# Patient Record
Sex: Male | Born: 1945 | Race: White | Hispanic: No | Marital: Single | Smoking: Never smoker
Health system: Southern US, Community
[De-identification: ages and names within clinical notes are randomized; demographics above are authoritative.]

## PROBLEM LIST (undated history)

## (undated) DIAGNOSIS — F319 Bipolar disorder, unspecified: Secondary | ICD-10-CM

## (undated) DIAGNOSIS — I10 Essential (primary) hypertension: Secondary | ICD-10-CM

## (undated) DIAGNOSIS — Z9289 Personal history of other medical treatment: Secondary | ICD-10-CM

## (undated) DIAGNOSIS — S329XXA Fracture of unspecified parts of lumbosacral spine and pelvis, initial encounter for closed fracture: Secondary | ICD-10-CM

## (undated) DIAGNOSIS — B269 Mumps without complication: Secondary | ICD-10-CM

## (undated) DIAGNOSIS — R011 Cardiac murmur, unspecified: Secondary | ICD-10-CM

## (undated) DIAGNOSIS — H409 Unspecified glaucoma: Secondary | ICD-10-CM

## (undated) DIAGNOSIS — B019 Varicella without complication: Secondary | ICD-10-CM

## (undated) HISTORY — DX: Mumps without complication: B26.9

## (undated) HISTORY — DX: Bipolar disorder, unspecified: F31.9

## (undated) HISTORY — PX: EYE SURGERY: SHX253

## (undated) HISTORY — PX: SURGERY SCROTAL / TESTICULAR: SUR1316

## (undated) HISTORY — PX: PENILE PROSTHESIS IMPLANT: SHX240

## (undated) HISTORY — DX: Unspecified glaucoma: H40.9

## (undated) HISTORY — PX: CHOLECYSTECTOMY: SHX55

## (undated) HISTORY — DX: Personal history of other medical treatment: Z92.89

## (undated) HISTORY — DX: Essential (primary) hypertension: I10

## (undated) HISTORY — DX: Cardiac murmur, unspecified: R01.1

## (undated) HISTORY — DX: Fracture of unspecified parts of lumbosacral spine and pelvis, initial encounter for closed fracture: S32.9XXA

## (undated) HISTORY — DX: Varicella without complication: B01.9

## (undated) HISTORY — PX: ADENOIDECTOMY: SUR15

---

## 2011-07-07 ENCOUNTER — Inpatient Hospital Stay: Payer: Self-pay | Admitting: Internal Medicine

## 2012-10-25 IMAGING — CT CT HEAD WITHOUT CONTRAST
2 series · 15 of 30 positions shown, 19 images · non-contrast
Comparison: none

REASON FOR EXAM: AMS
COMMENTS:

[Series 2: without · axial · non-contrast · 0.44mm/px · z∈[-198,-74]mm · 13 of 31 slices shown, 17 images]
[im 3/31  brain]
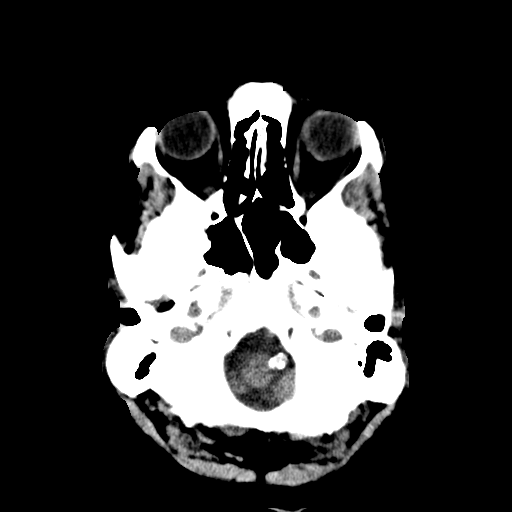
[im 3/31  bone]
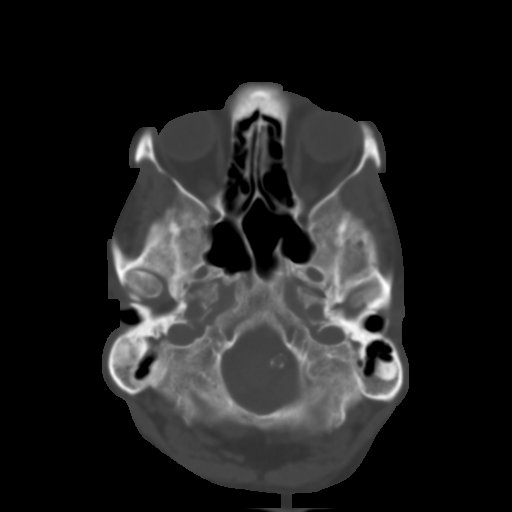
[im 5/31  brain]
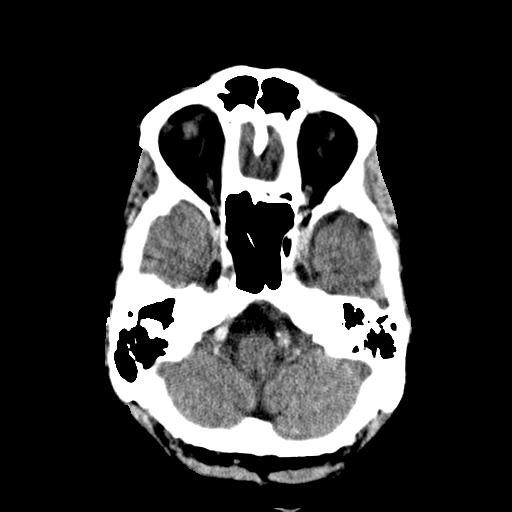
[im 7/31  brain]
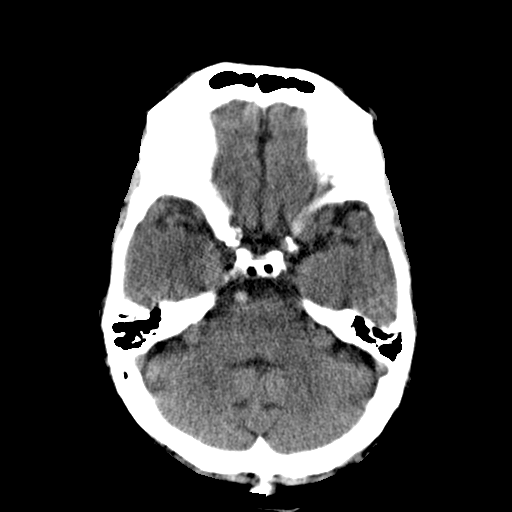
[im 9/31  brain]
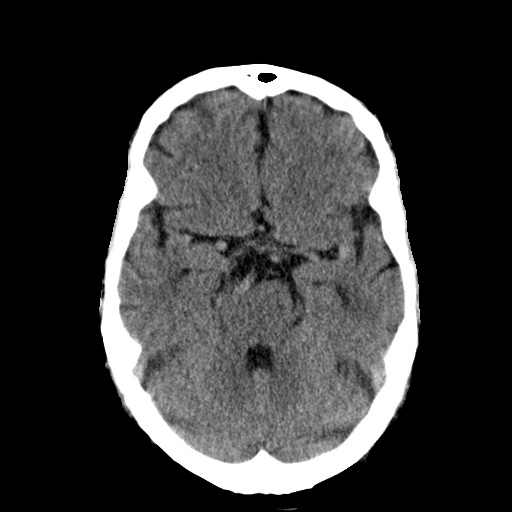
[im 11/31  brain]
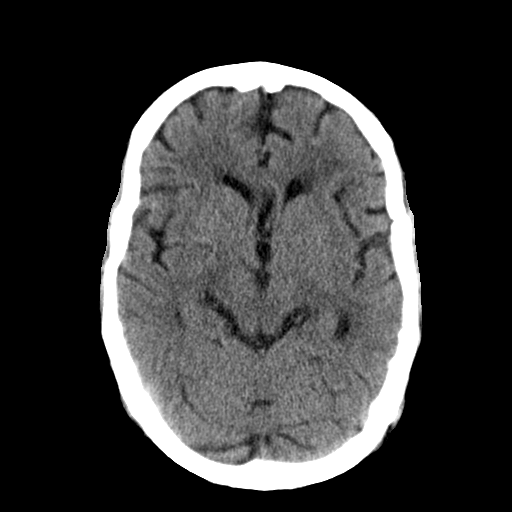
[im 11/31  bone]
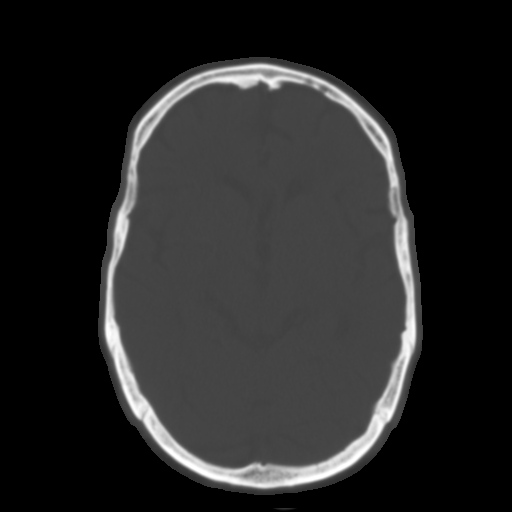
[im 13/31  brain]
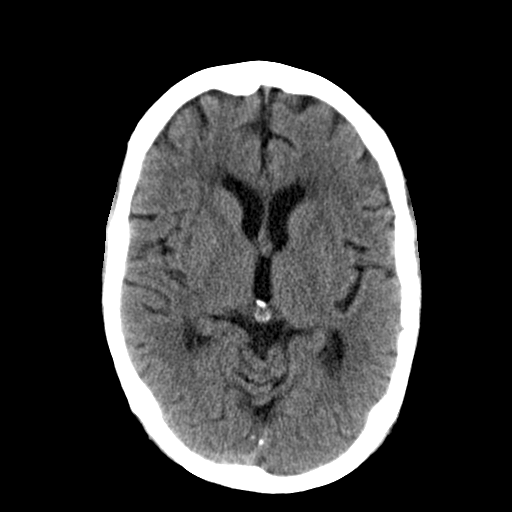
[im 16/31  brain]
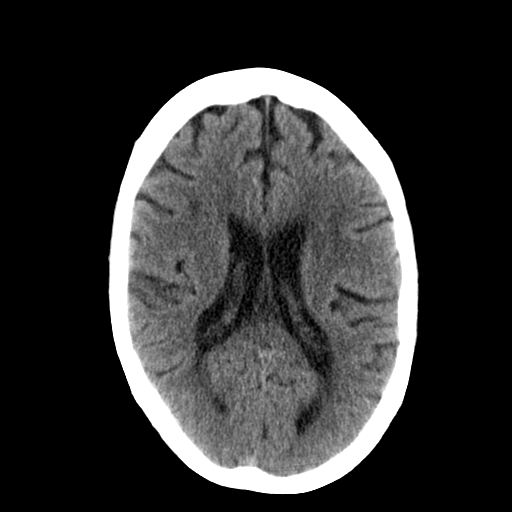
[im 18/31  brain]
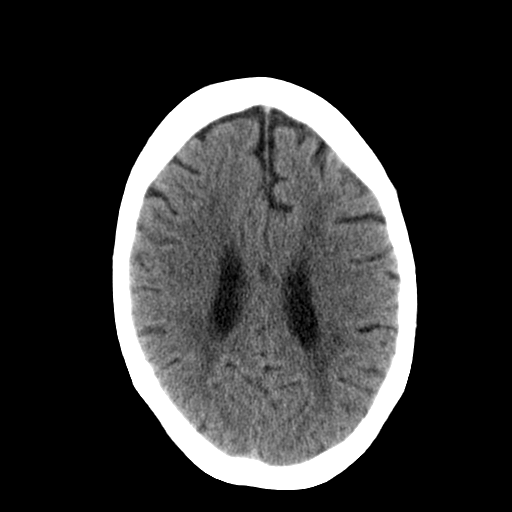
[im 20/31  brain]
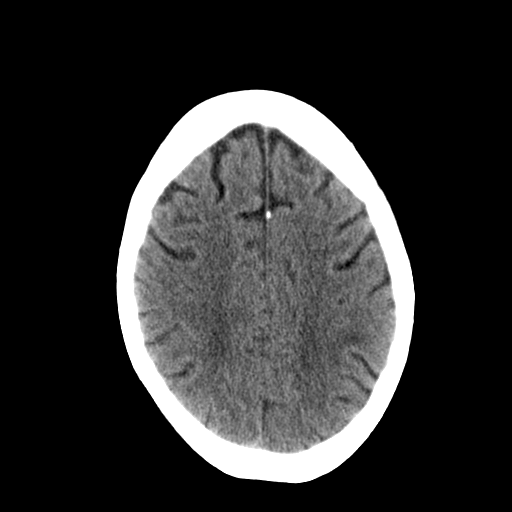
[im 20/31  bone]
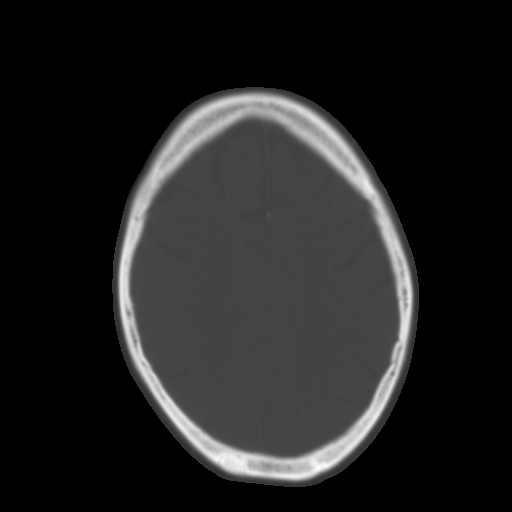
[im 22/31  brain]
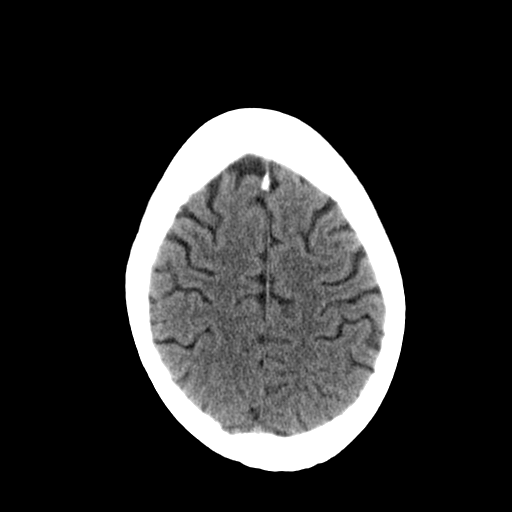
[im 24/31  brain]
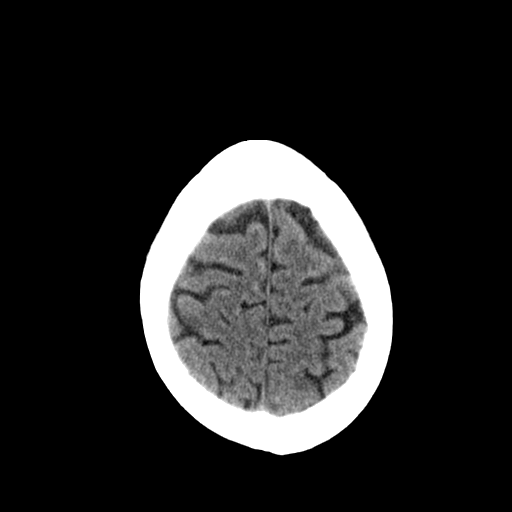
[im 26/31  brain]
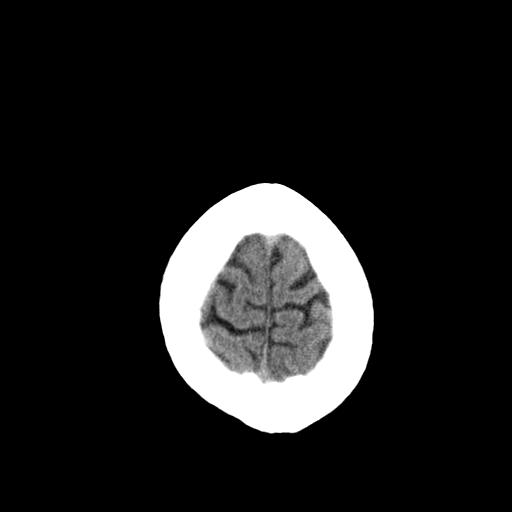
[im 28/31  brain]
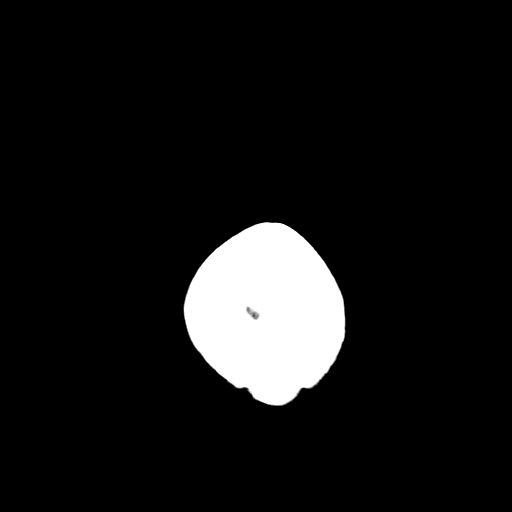
[im 28/31  bone]
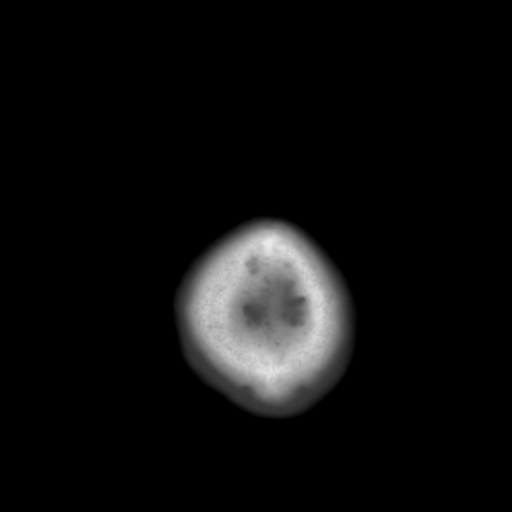

[Series 3: bone · axial · 0.44mm/px · z∈[-198,-178]mm · 2 of 31 slices shown]
[im 3/31  bone]
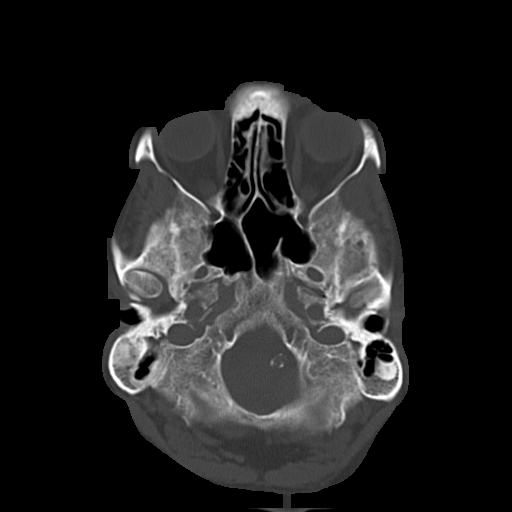
[im 7/31  bone]
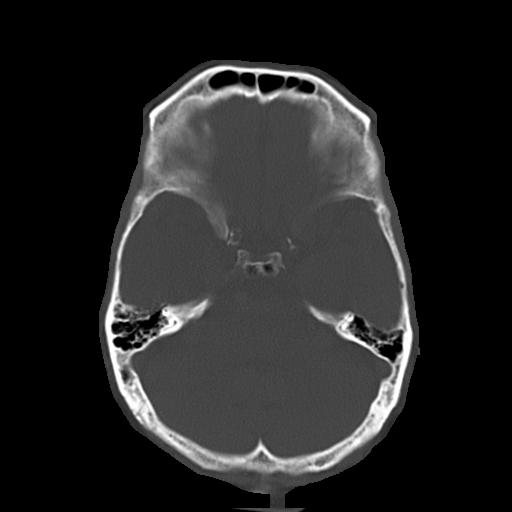

[15 of 30 positions shown; findings below may reference images not displayed]

PROCEDURE:     CT  - CT HEAD WITHOUT CONTRAST  - July 07, 2011  [DATE]

RESULT:     Axial noncontrast CT scanning was performed through the brain at
5 mm intervals and slice thicknesses. There are no previous studies
available for comparison.

The ventricles are normal in size and position. There is very mild
age-appropriate cerebral and cerebellar atrophy. There is no intracranial
hemorrhage nor intracranial mass effect. There is no evidence of an acute
evolving ischemic infarction. Subtle decreased density in the deep white
matter of both cerebral hemispheres slightly greater on the left than on the
right is consistent with chronic small vessel ischemic type change. At bone
window settings the observed portions of the paranasal sinuses and mastoid
air cells are clear. There is no evidence of an acute skull fracture.
IMPRESSION: I see no acute intracranial abnormality. There are
hypodensities in the deep white matter of both cerebral hemispheres slightly
more conspicuous on the left than on the right which are consistent with
chronic small vessel ischemia. Followup imaging is recommended if the
patient's symptoms persist.

## 2013-10-28 DIAGNOSIS — R159 Full incontinence of feces: Secondary | ICD-10-CM | POA: Diagnosis not present

## 2013-10-28 DIAGNOSIS — I10 Essential (primary) hypertension: Secondary | ICD-10-CM | POA: Diagnosis not present

## 2013-10-28 DIAGNOSIS — F3160 Bipolar disorder, current episode mixed, unspecified: Secondary | ICD-10-CM | POA: Diagnosis not present

## 2013-10-28 DIAGNOSIS — E876 Hypokalemia: Secondary | ICD-10-CM | POA: Diagnosis not present

## 2015-05-21 DIAGNOSIS — H269 Unspecified cataract: Secondary | ICD-10-CM | POA: Diagnosis not present

## 2015-07-23 ENCOUNTER — Encounter: Payer: Self-pay | Admitting: Family Medicine

## 2015-07-23 ENCOUNTER — Ambulatory Visit (INDEPENDENT_AMBULATORY_CARE_PROVIDER_SITE_OTHER): Payer: Medicare Other | Admitting: Family Medicine

## 2015-07-23 VITALS — BP 142/88 | HR 70 | Temp 98.7°F | Ht 68.25 in | Wt 202.5 lb

## 2015-07-23 DIAGNOSIS — E669 Obesity, unspecified: Secondary | ICD-10-CM

## 2015-07-23 DIAGNOSIS — R413 Other amnesia: Secondary | ICD-10-CM | POA: Diagnosis not present

## 2015-07-23 DIAGNOSIS — I10 Essential (primary) hypertension: Secondary | ICD-10-CM

## 2015-07-23 DIAGNOSIS — F319 Bipolar disorder, unspecified: Secondary | ICD-10-CM

## 2015-07-23 DIAGNOSIS — Z23 Encounter for immunization: Secondary | ICD-10-CM

## 2015-07-23 DIAGNOSIS — H409 Unspecified glaucoma: Secondary | ICD-10-CM

## 2015-07-23 DIAGNOSIS — R011 Cardiac murmur, unspecified: Secondary | ICD-10-CM

## 2015-07-23 DIAGNOSIS — Z0000 Encounter for general adult medical examination without abnormal findings: Secondary | ICD-10-CM

## 2015-07-23 MED ORDER — ASPIRIN EC 81 MG PO TBEC: 81.0000 mg | DELAYED_RELEASE_TABLET | Freq: Every day | ORAL | Status: DC

## 2015-07-23 MED ORDER — AMLODIPINE BESYLATE 5 MG PO TABS: 5.0000 mg | ORAL_TABLET | Freq: Every day | ORAL | Status: DC

## 2015-07-23 NOTE — Patient Instructions (Signed)
Take a baby aspirin daily.  Take the norvasc daily for your blood pressure.  We will call with your lab results.  Please consider seeing someone for your bipolar disorder. I would be happy to arrange it.  Follow up in ~ 3-6 months.   Take care  Dr Adriana Simas

## 2015-07-23 NOTE — Progress Notes (Signed)
Pre visit review using our clinic review tool, if applicable. No additional management support is needed unless otherwise documented below in the visit note. 

## 2015-07-24 ENCOUNTER — Encounter: Payer: Self-pay | Admitting: Family Medicine

## 2015-07-24 DIAGNOSIS — R011 Cardiac murmur, unspecified: Secondary | ICD-10-CM | POA: Insufficient documentation

## 2015-07-24 DIAGNOSIS — H409 Unspecified glaucoma: Secondary | ICD-10-CM | POA: Insufficient documentation

## 2015-07-24 DIAGNOSIS — Z0001 Encounter for general adult medical examination with abnormal findings: Secondary | ICD-10-CM | POA: Insufficient documentation

## 2015-07-24 DIAGNOSIS — I10 Essential (primary) hypertension: Secondary | ICD-10-CM | POA: Insufficient documentation

## 2015-07-24 NOTE — Assessment & Plan Note (Signed)
Not at goal. Starting Norvasc today.

## 2015-07-24 NOTE — Assessment & Plan Note (Signed)
Flu shot today. Labs: CBC, CMP, A1C, Lipid.

## 2015-07-24 NOTE — Assessment & Plan Note (Signed)
Patient reports a history of bipolar disorder. Patient was very tangential and difficult to follow today. He may have another underlying psychiatric diagnosis.  Will obtain records. Discussed psychiatry referral given his history and patient declined.

## 2015-07-24 NOTE — Progress Notes (Signed)
Subjective:  Patient ID: Danny Bergen., male    DOB: 08-22-1946  Age: 69 y.o. MRN: [id number]  CC: Establish care  HPI Danny Pelzer. is a 69 y.o. male presents to the clinic today to establish care. Current issues/concerns are below.  1) HTN  Patient is not on any medication for this at this time.  He has been off medication for approximately 3 years.  No current chest pain, shortness breath.   2) Glaucoma  Chronic problem.  Patient reports that he is followed by [facility].  He is not on any current therapy for this. I'm unsure of the diagnosis this time. Will obtain records.  3) Bipolar disorder  Patient reports he has a history of bipolar disorder.  He has been off his medication for approximately 3 years.  He previously was on lithium.  In our conversation today insinuates that he was hospitalized previously for this.  He denies any current issues or mood disturbance.   PMH, Surgical Hx, Family Hx, Social History reviewed and updated as below. Past Medical History  Diagnosis Date  . Chicken pox   . Bipolar disorder (HCC)   . Glaucoma   . Heart murmur   . Hypertension   . History of blood transfusion     Past Surgical History  Procedure Laterality Date  . Cholecystectomy    . Adenoidectomy    . 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 Penile prosthesis implant      Family History  Problem Relation Age of Onset  . Arthritis Mother   . Cancer Mother     lung   . Hypertension Mother   . Arthritis Father   . Cancer Father     lung and prostate  . Hypertension Father   . Diabetes Father   . Diabetes Sister     Social History  Substance Use Topics  . Smoking status: Never Smoker   . Smokeless tobacco: Never Used  . Alcohol Use: No   Review of Systems  Eyes: Positive for visual disturbance.  Cardiovascular: Positive for palpitations.  Genitourinary: Positive for urgency.     Incontinence.  Musculoskeletal: Positive for arthralgias.  Psychiatric/Behavioral:       Bipolar disorder. Memory problems.  All other systems negative.    Objective:   Today's Vitals: BP 142/88 mmHg  Pulse 70  Temp(Src) 98.7 F (37.1 C) (Oral)  Ht 5' 8.25" (1.734 m)  Wt 202 lb 8 oz (91.853 kg)  BMI 30.55 kg/m2  SpO2 97%  Physical Exam  Constitutional: He appears well-developed and well-nourished. No distress.  HENT:  Head: Normocephalic and atraumatic.  Right Ear: External ear normal.  Left Ear: External ear normal.  Mouth/Throat: No oropharyngeal exudate.  Poor dentition.  Normal TM's bilaterally.   Neck: Neck supple.  Cardiovascular: Normal rate and regular rhythm.   Murmur heard.  Systolic murmur is present with a grade of 2/6  Pulmonary/Chest: Effort normal and breath sounds normal. No respiratory distress. He has no wheezes. He has no rales.  Abdominal: Soft. He exhibits no distension. There is no tenderness. There is no rebound and no guarding.  Genitourinary:  No testicles noted in scrotum. Penile prosthesis noted.   Musculoskeletal: Normal range of motion. He exhibits no edema.  Lymphadenopathy:    He has no cervical adenopathy.  Neurological: He is alert.  Skin: Skin is warm and dry.  Psychiatric: He has a normal  mood and affect. His speech is tangential.  Vitals reviewed.  Assessment & Plan:   Problem List Items Addressed This Visit    Bipolar I disorder [facility])    Patient reports a history of bipolar disorder. Patient was very tangential and difficult to follow today. He may have another underlying psychiatric diagnosis.  Will obtain records. Discussed psychiatry referral given his history and patient declined.      Glaucoma    Followed by [facility]. I find a very odd that he is not on any medication for this.  Will obtain records.      Heart murmur, systolic   HTN (hypertension) - Primary    Not at goal. Starting Norvasc today.       Relevant Medications   amLODipine (NORVASC) 5 MG tablet   aspirin EC 81 MG tablet   Other Relevant Orders   Comprehensive metabolic panel   Lipid panel   Preventative health care    Flu shot today. Labs: CBC, CMP, A1C, Lipid.        Other Visit Diagnoses    Obesity        Relevant Orders    Hemoglobin A1c    Memory difficulty        Relevant Orders    CBC    Encounter for immunization           Outpatient Encounter Prescriptions as of 07/23/2015  Medication Sig  . [DISCONTINUED] aspirin EC 325 MG tablet Take 325 mg by mouth daily.  Marland Kitchen amLODipine (NORVASC) 5 MG tablet Take 1 tablet (5 mg total) by mouth daily.  Marland Kitchen aspirin EC 81 MG tablet Take 1 tablet (81 mg total) by mouth daily.   No facility-administered encounter medications on file as of 07/23/2015.    Follow-up: 3-6 months.    Tommie Sams DO

## 2015-07-24 NOTE — Assessment & Plan Note (Signed)
Followed by [facility]. I find a very odd that he is not on any medication for this.  Will obtain records.

## 2015-07-30 ENCOUNTER — Telehealth: Payer: Self-pay

## 2015-07-30 NOTE — Telephone Encounter (Signed)
Called and left voicemail for pt to come back to office to get labs done. He needs to schedule an appointment.

## 2015-07-31 ENCOUNTER — Other Ambulatory Visit (INDEPENDENT_AMBULATORY_CARE_PROVIDER_SITE_OTHER): Payer: Medicare Other

## 2015-07-31 DIAGNOSIS — I10 Essential (primary) hypertension: Secondary | ICD-10-CM

## 2015-07-31 LAB — LIPID PANEL
CHOL/HDL RATIO: 4
Cholesterol: 172 mg/dL (ref 0–200)
HDL: 39.8 mg/dL (ref >39.00)
LDL Cholesterol: 115 mg/dL — ABNORMAL HIGH (ref 0–99)
NonHDL: 131.99
Triglycerides: 87 mg/dL (ref 0.0–149.0)
VLDL: 17.4 mg/dL (ref 0.0–40.0)

## 2015-07-31 LAB — COMPREHENSIVE METABOLIC PANEL
ALT: 15 U/L (ref 0–53)
AST: 19 U/L (ref 0–37)
Albumin: 4.2 g/dL (ref 3.5–5.2)
Alkaline Phosphatase: 110 U/L (ref 39–117)
BUN: 15 mg/dL (ref 6–23)
CALCIUM: 9.8 mg/dL (ref 8.4–10.5)
CO2: 27 meq/L (ref 19–32)
CREATININE: 1.34 mg/dL (ref 0.40–1.50)
Chloride: 103 mEq/L (ref 96–112)
GFR: 55.83 mL/min — ABNORMAL LOW (ref >60.00)
Glucose, Bld: 141 mg/dL — ABNORMAL HIGH (ref 70–99)
Potassium: 3.8 mEq/L (ref 3.5–5.1)
Sodium: 139 mEq/L (ref 135–145)
TOTAL PROTEIN: 7.6 g/dL (ref 6.0–8.3)
Total Bilirubin: 0.9 mg/dL (ref 0.2–1.2)

## 2015-07-31 LAB — CBC
HCT: 42.8 % (ref 39.0–52.0)
Hemoglobin: 13.7 g/dL (ref 13.0–17.0)
MCHC: 32 g/dL (ref 30.0–36.0)
MCV: 84.3 fl (ref 78.0–100.0)
Platelets: 182 10*3/uL (ref 150.0–400.0)
RBC: 5.07 Mil/uL (ref 4.22–5.81)
RDW: 16.4 % — ABNORMAL HIGH (ref 11.5–15.5)
WBC: 7 10*3/uL (ref 4.0–10.5)

## 2015-07-31 LAB — HEMOGLOBIN A1C: Hgb A1c MFr Bld: 5.5 % (ref 4.6–6.5)

## 2015-08-04 ENCOUNTER — Telehealth: Payer: Self-pay | Admitting: *Deleted

## 2015-08-04 NOTE — Telephone Encounter (Signed)
Patient stated that he missed a call, and can reached for the rest of the day to get his lab results.

## 2015-08-05 ENCOUNTER — Other Ambulatory Visit: Payer: Self-pay | Admitting: Family Medicine

## 2015-08-05 MED ORDER — ATORVASTATIN CALCIUM 40 MG PO TABS: 40.0000 mg | ORAL_TABLET | Freq: Every day | ORAL | Status: DC

## 2015-11-03 DIAGNOSIS — H2513 Age-related nuclear cataract, bilateral: Secondary | ICD-10-CM | POA: Diagnosis not present

## 2015-11-19 ENCOUNTER — Ambulatory Visit (INDEPENDENT_AMBULATORY_CARE_PROVIDER_SITE_OTHER): Payer: Medicare Other | Admitting: Family Medicine

## 2015-11-19 VITALS — BP 132/82 | HR 68 | Temp 98.4°F | Ht 68.25 in | Wt 208.1 lb

## 2015-11-19 DIAGNOSIS — E785 Hyperlipidemia, unspecified: Secondary | ICD-10-CM

## 2015-11-19 DIAGNOSIS — R944 Abnormal results of kidney function studies: Secondary | ICD-10-CM | POA: Insufficient documentation

## 2015-11-19 DIAGNOSIS — I10 Essential (primary) hypertension: Secondary | ICD-10-CM

## 2015-11-19 DIAGNOSIS — F99 Mental disorder, not otherwise specified: Secondary | ICD-10-CM

## 2015-11-19 DIAGNOSIS — Z23 Encounter for immunization: Secondary | ICD-10-CM

## 2015-11-19 DIAGNOSIS — F319 Bipolar disorder, unspecified: Secondary | ICD-10-CM | POA: Diagnosis not present

## 2015-11-19 DIAGNOSIS — Z0000 Encounter for general adult medical examination without abnormal findings: Secondary | ICD-10-CM | POA: Diagnosis not present

## 2015-11-19 MED ORDER — ZOSTER VACCINE LIVE 19400 UNT/0.65ML ~~LOC~~ SOLR: 0.6500 mL | Freq: Once | SUBCUTANEOUS | Status: DC

## 2015-11-19 NOTE — Assessment & Plan Note (Signed)
Well controlled. Continue Norvasc. 

## 2015-11-19 NOTE — Patient Instructions (Signed)
Continue your current medications.  We will call with your appt for the colonoscopy as well as the appt to see someone about the bipolar.  Follow up in 6 months.  Take care  Dr. Adriana Simas

## 2015-11-19 NOTE — Assessment & Plan Note (Signed)
Stable on Lipitor. Will continue. 

## 2015-11-19 NOTE — Progress Notes (Signed)
Subjective:  Patient ID: Danny Mcgrath., male    DOB: [birth year]  Age: 70 y.o. MRN: [id number]  CC: Follow up  HPI:  70 year old male with HTN, HLD, ? Bipolar disorder presents for follow up.  HTN  Well controlled on Norvasc.  HLD  Stable on Lipitor.  Tolerating without difficultly.  ? Bipolar  Patient was previously on Lithium and has a reported history of bipolar disorder.  We discussed referral at our last visit and he refused.  He desires referral today.  Preventative health care  In need of colonoscopy, Zostavax, Prevnar, Tdap.  Social Hx   Social History   Social History  . Marital Status: Single    Spouse Name: N/A  . Number of Children: N/A  . Years of Education: N/A   Social History Main Topics  . Smoking status: Never Smoker   . Smokeless tobacco: Never Used  . Alcohol Use: No  . Drug Use: No  . Sexual Activity: Not on file   Other Topics Concern  . Not on file   Social History Narrative   Review of Systems  Constitutional: Negative.   Respiratory: Negative.   Cardiovascular: Negative.   Psychiatric/Behavioral:       Hallucinations, memory difficulty.    Objective:  BP 132/82 mmHg  Pulse 68  Temp(Src) 98.4 F (36.9 C) (Oral)  Ht 5' 8.25" (1.734 m)  Wt 208 lb 2 oz (94.405 kg)  BMI 31.40 kg/m2  SpO2 95%  BP/Weight 11/19/2015 07/23/2015  Systolic BP 132 142  Diastolic BP 82 88  Wt. (Lbs) 208.13 202.5  BMI 31.4 30.55    Physical Exam  Constitutional: He appears well-developed.  Well groomed today. Significant body odor. NAD.  Cardiovascular: Normal rate and regular rhythm.   Pulmonary/Chest: Effort normal and breath sounds normal.  Psychiatric: His speech is tangential.  Flat affect. Discussed several instances suggestive of hallucinations. Very Tangential and difficult to follow.   Vitals reviewed.   Lab Results  Component Value Date   WBC 7.0 07/31/2015   HGB 13.7 07/31/2015   HCT 42.8 07/31/2015   PLT 182.0  07/31/2015   GLUCOSE 141* 07/31/2015   CHOL 172 07/31/2015   TRIG 87.0 07/31/2015   HDL 39.80 07/31/2015   LDLCALC 115* 07/31/2015   ALT 15 07/31/2015   AST 19 07/31/2015   NA 139 07/31/2015   K 3.8 07/31/2015   CL 103 07/31/2015   CREATININE 1.34 07/31/2015   BUN 15 07/31/2015   CO2 27 07/31/2015   HGBA1C 5.5 07/31/2015    Assessment & Plan:   Problem List Items Addressed This Visit    Chronic mental illness    Patient revealed that he was hospitalized in 2012. He states that he has had several times where he experienced things that were not real. He is very tangential and difficult to follow. Poor hygiene. Does not appear to have bipolar disorder (but reports a history of this). Likely has schizophrenia. Referring to psych.      Relevant Orders   Ambulatory referral to Psychiatry   HTN (hypertension) - Primary    Well controlled. Continue Norvasc.      Hyperlipidemia    Stable on Lipitor. Will continue.      Preventative health care    Tdap, Prevnar given today. Rx for Zostavax given. Will place referral for colonoscopy.       Other Visit Diagnoses    Bipolar 1 disorder (HCC)        Relevant  Orders    Ambulatory referral to Psychiatry    Need for prophylactic vaccination with combined diphtheria-tetanus-pertussis (DTP) vaccine        Relevant Orders    Tdap vaccine greater than or equal to 7yo IM (Completed)    Need for prophylactic vaccination against Streptococcus pneumoniae (pneumococcus)        Relevant Orders    Pneumococcal conjugate vaccine 13-valent (Completed)       Meds ordered this encounter  Medications  . zoster vaccine live, PF, (ZOSTAVAX) 40981 UNT/0.65ML injection    Sig: Inject 19,400 Units into the skin once.    Dispense:  1 each    Refill:  0   Follow-up: Return in about 6 months (around 05/18/2016).  Everlene Other DO [facility]

## 2015-11-19 NOTE — Assessment & Plan Note (Signed)
Patient revealed that he was hospitalized in 2012. He states that he has had several times where he experienced things that were not real. He is very tangential and difficult to follow. Poor hygiene. Does not appear to have bipolar disorder (but reports a history of this). Likely has schizophrenia. Referring to psych.

## 2015-11-19 NOTE — Assessment & Plan Note (Signed)
Tdap, Prevnar given today. Rx for Zostavax given. Will place referral for colonoscopy.

## 2015-11-24 ENCOUNTER — Ambulatory Visit: Payer: Medicare Other | Admitting: Family Medicine

## 2015-12-18 NOTE — Discharge Instructions (Signed)

## 2016-01-06 ENCOUNTER — Encounter: Payer: Self-pay | Admitting: *Deleted

## 2016-01-07 NOTE — Anesthesia Preprocedure Evaluation (Addendum)
Anesthesia Evaluation  Patient identified by MRN, date of birth, ID band Patient awake    Reviewed: Allergy & Precautions, NPO status , Patient's Chart, lab work & pertinent test results, reviewed documented beta blocker date and time   Airway Mallampati: II  TM Distance: >3 FB Neck ROM: Full    Dental  (+) Poor Dentition   Pulmonary neg pulmonary ROS,    Pulmonary exam normal        Cardiovascular hypertension, Pt. on medications Normal cardiovascular exam+ Valvular Problems/Murmurs      Neuro/Psych PSYCHIATRIC DISORDERS Bipolar Disorder negative neurological ROS     GI/Hepatic negative GI ROS, Neg liver ROS,   Endo/Other  negative endocrine ROS  Renal/GU negative Renal ROS     Musculoskeletal negative musculoskeletal ROS (+)   Abdominal   Peds  Hematology negative hematology ROS (+)   Anesthesia Other Findings   Reproductive/Obstetrics                           Anesthesia Physical Anesthesia Plan  ASA: II  Anesthesia Plan: MAC   Post-op Pain Management:    Induction: Intravenous  Airway Management Planned:   Additional Equipment:   Intra-op Plan:   Post-operative Plan:   Informed Consent: I have reviewed the patients History and Physical, chart, labs and discussed the procedure including the risks, benefits and alternatives for the proposed anesthesia with the patient or authorized representative who has indicated his/her understanding and acceptance.     Plan Discussed with: CRNA  Anesthesia Plan Comments:         Anesthesia Quick Evaluation

## 2016-01-12 ENCOUNTER — Ambulatory Visit: Payer: Self-pay | Admitting: Psychiatry

## 2016-01-13 ENCOUNTER — Encounter
Admission: RE | Disposition: A | Discharge status: Home / Self Care | Payer: Self-pay | Source: Ambulatory Visit | Attending: Ophthalmology

## 2016-01-13 ENCOUNTER — Ambulatory Visit: Payer: Medicare Other | Admitting: Anesthesiology

## 2016-01-13 ENCOUNTER — Ambulatory Visit
Admission: RE | Admit: 2016-01-13 | Discharge: 2016-01-13 | Disposition: A | Discharge status: Home / Self Care | Payer: Medicare Other | Source: Ambulatory Visit | Attending: Ophthalmology | Admitting: Ophthalmology

## 2016-01-13 DIAGNOSIS — F319 Bipolar disorder, unspecified: Secondary | ICD-10-CM | POA: Diagnosis not present

## 2016-01-13 DIAGNOSIS — R002 Palpitations: Secondary | ICD-10-CM | POA: Diagnosis not present

## 2016-01-13 DIAGNOSIS — R011 Cardiac murmur, unspecified: Secondary | ICD-10-CM | POA: Insufficient documentation

## 2016-01-13 DIAGNOSIS — E7800 Pure hypercholesterolemia, unspecified: Secondary | ICD-10-CM | POA: Diagnosis not present

## 2016-01-13 DIAGNOSIS — H2511 Age-related nuclear cataract, right eye: Secondary | ICD-10-CM | POA: Insufficient documentation

## 2016-01-13 DIAGNOSIS — I10 Essential (primary) hypertension: Secondary | ICD-10-CM | POA: Insufficient documentation

## 2016-01-13 HISTORY — PX: CATARACT EXTRACTION W/PHACO: SHX586

## 2016-01-13 SURGERY — PHACOEMULSIFICATION, CATARACT, WITH IOL INSERTION
Anesthesia: Monitor Anesthesia Care | Laterality: Right | Wound class: Clean

## 2016-01-13 MED ORDER — TIMOLOL MALEATE 0.5 % OP SOLN
OPHTHALMIC | Status: DC | PRN
  Administered 2016-01-13: 1 [drp] via OPHTHALMIC

## 2016-01-13 MED ORDER — ARMC OPHTHALMIC DILATING GEL
1.0000 "application " | OPHTHALMIC | Status: DC | PRN
  Administered 2016-01-13 (×2): 1 via OPHTHALMIC

## 2016-01-13 MED ORDER — OXYCODONE HCL 5 MG/5ML PO SOLN: 5.0000 mg | Freq: Once | ORAL | Status: DC | PRN

## 2016-01-13 MED ORDER — LACTATED RINGERS IV SOLN: INTRAVENOUS | Status: DC

## 2016-01-13 MED ORDER — TETRACAINE HCL 0.5 % OP SOLN
1.0000 [drp] | OPHTHALMIC | Status: DC | PRN
  Administered 2016-01-13: 1 [drp] via OPHTHALMIC

## 2016-01-13 MED ORDER — MEPERIDINE HCL 25 MG/ML IJ SOLN: 6.2500 mg | INTRAMUSCULAR | Status: DC | PRN

## 2016-01-13 MED ORDER — CEFUROXIME OPHTHALMIC INJECTION 1 MG/0.1 ML
INJECTION | OPHTHALMIC | Status: DC | PRN
  Administered 2016-01-13: 0.1 mL via OPHTHALMIC

## 2016-01-13 MED ORDER — OXYCODONE HCL 5 MG PO TABS: 5.0000 mg | ORAL_TABLET | Freq: Once | ORAL | Status: DC | PRN

## 2016-01-13 MED ORDER — MIDAZOLAM HCL 2 MG/2ML IJ SOLN
INTRAMUSCULAR | Status: DC | PRN
  Administered 2016-01-13: 2 mg via INTRAVENOUS

## 2016-01-13 MED ORDER — HYDROMORPHONE HCL 1 MG/ML IJ SOLN: 0.2500 mg | INTRAMUSCULAR | Status: DC | PRN

## 2016-01-13 MED ORDER — EPINEPHRINE HCL 1 MG/ML IJ SOLN
INTRAMUSCULAR | Status: DC | PRN
  Administered 2016-01-13: 73 mL via OPHTHALMIC

## 2016-01-13 MED ORDER — POVIDONE-IODINE 5 % OP SOLN
1.0000 "application " | OPHTHALMIC | Status: DC | PRN
  Administered 2016-01-13: 1 via OPHTHALMIC

## 2016-01-13 MED ORDER — NA HYALUR & NA CHOND-NA HYALUR 0.4-0.35 ML IO KIT
PACK | INTRAOCULAR | Status: DC | PRN
  Administered 2016-01-13: 1 mL via INTRAOCULAR

## 2016-01-13 MED ORDER — FENTANYL CITRATE (PF) 100 MCG/2ML IJ SOLN
INTRAMUSCULAR | Status: DC | PRN
  Administered 2016-01-13: 50 ug via INTRAVENOUS

## 2016-01-13 MED ORDER — PROMETHAZINE HCL 25 MG/ML IJ SOLN: 6.2500 mg | INTRAMUSCULAR | Status: DC | PRN

## 2016-01-13 MED ORDER — BRIMONIDINE TARTRATE 0.2 % OP SOLN
OPHTHALMIC | Status: DC | PRN
  Administered 2016-01-13: 1 [drp] via OPHTHALMIC

## 2016-01-13 SURGICAL SUPPLY — 26 items
CANNULA ANT/CHMB 27GA (MISCELLANEOUS) ×3 IMPLANT
CARTRIDGE ABBOTT (MISCELLANEOUS) ×3 IMPLANT
GLOVE SURG LX 7.5 STRW (GLOVE) ×2
GLOVE SURG LX STRL 7.5 STRW (GLOVE) ×1 IMPLANT
GLOVE SURG TRIUMPH 8.0 PF LTX (GLOVE) ×3 IMPLANT
GOWN STRL REUS W/ TWL LRG LVL3 (GOWN DISPOSABLE) ×2 IMPLANT
GOWN STRL REUS W/TWL LRG LVL3 (GOWN DISPOSABLE) ×4
LENS IOL TECNIS ITEC 20.0 (Intraocular Lens) ×3 IMPLANT
MARKER SKIN DUAL TIP RULER LAB (MISCELLANEOUS) ×3 IMPLANT
NDL RETROBULBAR .5 NSTRL (NEEDLE) IMPLANT
NEEDLE FILTER BLUNT 18X 1/2SAF (NEEDLE) ×2
NEEDLE FILTER BLUNT 18X1 1/2 (NEEDLE) ×1 IMPLANT
PACK CATARACT BRASINGTON (MISCELLANEOUS) ×3 IMPLANT
PACK EYE AFTER SURG (MISCELLANEOUS) ×3 IMPLANT
PACK OPTHALMIC (MISCELLANEOUS) ×3 IMPLANT
RING MALYGIN 7.0 (MISCELLANEOUS) IMPLANT
SUT ETHILON 10-0 CS-B-6CS-B-6 (SUTURE)
SUT VICRYL  9 0 (SUTURE)
SUT VICRYL 9 0 (SUTURE) IMPLANT
SUTURE EHLN 10-0 CS-B-6CS-B-6 (SUTURE) IMPLANT
SYR 3ML LL SCALE MARK (SYRINGE) ×3 IMPLANT
SYR 5ML LL (SYRINGE) IMPLANT
SYR TB 1ML LUER SLIP (SYRINGE) ×3 IMPLANT
WATER STERILE IRR 250ML POUR (IV SOLUTION) ×3 IMPLANT
WATER STERILE IRR 500ML POUR (IV SOLUTION) IMPLANT
WIPE NON LINTING 3.25X3.25 (MISCELLANEOUS) ×3 IMPLANT

## 2016-01-13 NOTE — H&P (Signed)
  The History and Physical notes are on paper, have been signed, and are to be scanned. The patient remains stable and unchanged from the H&P.   Previous H&P reviewed, patient examined, and there are no changes.  Danny Mcgrath 01/13/2016 10:54 AM  

## 2016-01-13 NOTE — Anesthesia Procedure Notes (Signed)
Procedure Name: MAC Performed by: Simrit Gohlke Pre-anesthesia Checklist: Patient identified, Emergency Drugs available, Suction available, Timeout performed and Patient being monitored Patient Re-evaluated:Patient Re-evaluated prior to inductionOxygen Delivery Method: Nasal cannula Placement Confirmation: positive ETCO2       

## 2016-01-13 NOTE — Transfer of Care (Signed)
Immediate Anesthesia Transfer of Care Note  Patient: Danny MathJoseph Dahan Jr.  Procedure(s) Performed: Procedure(s): CATARACT EXTRACTION PHACO AND INTRAOCULAR LENS PLACEMENT (IOC) (Right)  Patient Location: PACU  Anesthesia Type: MAC  Level of Consciousness: awake, alert  and patient cooperative  Airway and Oxygen Therapy: Patient Spontanous Breathing and Patient connected to supplemental oxygen  Post-op Assessment: Post-op Vital signs reviewed, Patient's Cardiovascular Status Stable, Respiratory Function Stable, Patent Airway and No signs of Nausea or vomiting  Post-op Vital Signs: Reviewed and stable  Complications: No apparent anesthesia complications

## 2016-01-13 NOTE — Anesthesia Postprocedure Evaluation (Signed)
Anesthesia Post Note  Patient: Danny MathJoseph Cortez Jr.  Procedure(s) Performed: Procedure(s) (LRB): CATARACT EXTRACTION PHACO AND INTRAOCULAR LENS PLACEMENT (IOC) (Right)  Patient location during evaluation: PACU Anesthesia Type: MAC Level of consciousness: awake and alert and oriented Pain management: pain level controlled Vital Signs Assessment: post-procedure vital signs reviewed and stable Respiratory status: spontaneous breathing and nonlabored ventilation Cardiovascular status: stable Postop Assessment: no signs of nausea or vomiting and adequate PO intake Anesthetic complications: no    Harolyn RutherfordJoshua Shakendra Griffeth

## 2016-01-13 NOTE — Op Note (Signed)
LOCATION:  [facility]   PREOPERATIVE DIAGNOSIS:    Nuclear sclerotic cataract right eye. H25.11   POSTOPERATIVE DIAGNOSIS:  Nuclear sclerotic cataract right eye.     PROCEDURE:  Phacoemusification with posterior chamber intraocular lens placement of the right eye   LENS:   Implant Name Type Inv. Item Serial No. Manufacturer Lot No. LRB No. Used  [id number] 20.0D lens     [id number] [employer]   Right 1        ULTRASOUND TIME: 17.5 % of 1 minutes, 5 seconds.  CDE 11.4   SURGEON:  Deirdre Evenerhadwick R. Graysyn Bache, MD   ANESTHESIA:  Topical with tetracaine drops and 2% Xylocaine jelly.   COMPLICATIONS:  None.   DESCRIPTION OF PROCEDURE:  The patient was identified in the holding room and transported to the operating room and placed in the supine position under the operating microscope.  The right eye was identified as the operative eye and it was prepped and draped in the usual sterile ophthalmic fashion.   A 1 millimeter clear-corneal paracentesis was made at the 12:00 position.  The anterior chamber was filled with Viscoat viscoelastic.  A 2.4 millimeter keratome was used to make a near-clear corneal incision at the 9:00 position.  A curvilinear capsulorrhexis was made with a cystotome and capsulorrhexis forceps.  Balanced salt solution was used to hydrodissect and hydrodelineate the nucleus.   Phacoemulsification was then used in stop and chop fashion to remove the lens nucleus and epinucleus.  The remaining cortex was then removed using the irrigation and aspiration handpiece. Provisc was then placed into the capsular bag to distend it for lens placement.  A lens was then injected into the capsular bag.  The remaining viscoelastic was aspirated.   Wounds were hydrated with balanced salt solution.  The anterior chamber was inflated to a physiologic pressure with balanced salt solution.  No wound leaks were noted. Cefuroxime 0.1 ml of a 10mg /ml solution was injected into the anterior  chamber for a dose of 1 mg of intracameral antibiotic at the completion of the case.   Timolol and Brimonidine drops were applied to the eye.  The patient was taken to the recovery room in stable condition without complications of anesthesia or surgery.   Danny Mcgrath 01/13/2016, 11:43 AM

## 2016-01-14 ENCOUNTER — Encounter: Payer: Self-pay | Admitting: Ophthalmology

## 2016-01-21 ENCOUNTER — Ambulatory Visit (INDEPENDENT_AMBULATORY_CARE_PROVIDER_SITE_OTHER): Payer: 59 | Admitting: Psychiatry

## 2016-01-21 ENCOUNTER — Encounter: Payer: Self-pay | Admitting: Psychiatry

## 2016-01-21 VITALS — BP 124/82 | HR 74 | Temp 97.7°F | Ht 69.0 in | Wt 202.8 lb

## 2016-01-21 DIAGNOSIS — R4189 Other symptoms and signs involving cognitive functions and awareness: Secondary | ICD-10-CM | POA: Diagnosis not present

## 2016-01-21 DIAGNOSIS — R4689 Other symptoms and signs involving appearance and behavior: Secondary | ICD-10-CM

## 2016-01-21 DIAGNOSIS — F203 Undifferentiated schizophrenia: Secondary | ICD-10-CM | POA: Diagnosis not present

## 2016-01-21 DIAGNOSIS — F919 Conduct disorder, unspecified: Secondary | ICD-10-CM | POA: Diagnosis not present

## 2016-01-21 MED ORDER — QUETIAPINE FUMARATE 25 MG PO TABS: 25.0000 mg | ORAL_TABLET | Freq: Every day | ORAL | Status: DC

## 2016-01-21 MED ORDER — SERTRALINE HCL 25 MG PO TABS: 25.0000 mg | ORAL_TABLET | Freq: Every day | ORAL | Status: DC

## 2016-01-21 NOTE — Progress Notes (Signed)
Psychiatric Initial Adult Assessment   Patient Identification: Danny Mcgrath. MRN:  [id number] Date of Evaluation:  01/21/2016 Referral Source: Dr Adriana SimasErlene Quan.  Chief Complaint:   Chief Complaint    Establish Care; Depression     Visit Diagnosis:    ICD-9-CM ICD-10-CM   1. 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 He was referred by his primary care physician at [facility]. Patient reported that he has been following Dr. Adriana Simas for a long time and he suggested that he should start seeing a psychiatrist. Patient has a strong smell but was well groomed. He reported that he fell last night but was able to get himself together. He reported that in 2012 he has history of falling down and he at least felt 2 dozen times. He mentioned that he has history of taking lithium in the past and was admitted to a psychiatric hospital in [place] many years ago. He reported that he does not remember if he was prescribed any medications at that time and was only getting therapy. Patient reported that he has problems with his memory and will lose train of thoughts. He was noted to be very tangential during the interview and has to be redirected multiple times. He was unable to provide any coherent history. He currently denied having any suicidal ideations or plans. He reported that he feels depressed but denied having any thoughts to hurt himself or anyone else. He reported that he cooks for himself and will eat sandwiches or frozen food He stated that he is compliant with his medications and will follow-up with his outpatient appointments on a regular basis He is not looking for any help with his ADLs. He reported that he occasionally has visual hallucinations but not clearly defined  Associated Signs/Symptoms: Depression Symptoms:  depressed  mood, insomnia, psychomotor retardation, fatigue, difficulty concentrating, hopelessness, impaired memory, anxiety, disturbed sleep, (Hypo) Manic Symptoms:  none Anxiety Symptoms:  none Psychotic Symptoms:  Hallucinations: Visual Ideas of Reference, Paranoia, PTSD Symptoms: Negative NA  Past Psychiatric History:  H/o Psychiatric admission in [place] Has seen a Psychiatrist in [place]- Does not remember the name.   Previous Psychotropic Medications: Yes  Lithium Zoloft  Substance Abuse History in the last 12 months:  No.  Consequences of Substance Abuse: Negative NA  Past Medical History:  Past Medical History  Diagnosis Date  . Chicken pox   . Bipolar disorder (HCC)   . Glaucoma   . Heart murmur   . Hypertension   . History of blood transfusion     Past Surgical History  Procedure Laterality Date  . Cholecystectomy    . Adenoidectomy    . 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 Penile prosthesis implant    . Cataract extraction w/phaco Right 01/13/2016    Procedure: CATARACT EXTRACTION PHACO AND INTRAOCULAR LENS PLACEMENT (IOC);  Surgeon: Lockie Mola, MD;  Location: [facility];  Service: Ophthalmology;  Laterality: Right;    Family Psychiatric History: He reported that his parents are deceased and his only sister is also deceased.  Family History:  Family History  Problem Relation Age of Onset  . Arthritis Mother   . Cancer Mother     lung   . Hypertension Mother   . Anxiety disorder Mother   . Depression Mother   .  Arthritis Father   . Cancer Father     lung and prostate  . Hypertension Father   . Diabetes Father   . Diabetes Sister   . Leukemia Sister     Social History:   Social History   Social History  . Marital Status: Single    Spouse Name: N/A  . Number of Children: N/A  . Years of Education: N/A   Social History Main Topics  . Smoking status: Never Smoker    . Smokeless tobacco: Never Used  . Alcohol Use: No  . Drug Use: No  . Sexual Activity: Not Currently   Other Topics Concern  . None   Social History Narrative    Additional Social History:  Patient was married 3 times. His first marriage was in [id number]. He reported that she passed away. Second time he got married in 1989 and it lasted  lasted for 5 years. His third marriage was in 2009. He reported that he does not have any children as " he was unable to produce  sperms". .  Allergies:  No Known Allergies  Metabolic Disorder Labs: Lab Results  Component Value Date   HGBA1C 5.5 07/31/2015   No results found for: PROLACTIN Lab Results  Component Value Date   CHOL 172 07/31/2015   TRIG 87.0 07/31/2015   HDL 39.80 07/31/2015   CHOLHDL 4 07/31/2015   VLDL 17.4 07/31/2015   LDLCALC 115* 07/31/2015     Current Medications: Current Outpatient Prescriptions  Medication Sig Dispense Refill  . amLODipine (NORVASC) 5 MG tablet Take 1 tablet (5 mg total) by mouth daily. 90 tablet 3  . aspirin EC 81 MG tablet Take 1 tablet (81 mg total) by mouth daily. 90 tablet 3  . atorvastatin (LIPITOR) 40 MG tablet Take 1 tablet (40 mg total) by mouth daily. 90 tablet 3   No current facility-administered medications for this visit.    Neurologic: Headache: No Seizure: No Paresthesias:No  Musculoskeletal: Strength & Muscle Tone: within normal limits Gait & Station: normal Patient leans: N/A  Psychiatric Specialty Exam: ROS  Blood pressure 124/82, pulse 74, temperature 97.7 F (36.5 C), temperature source Tympanic, height 5\' 9"  (1.753 m), weight 202 lb 12.8 oz (91.989 kg), SpO2 92 %.Body mass index is 29.93 kg/(m^2).  General Appearance: Bad Body Odor  Eye Contact:  Fair  Speech:  Garbled and difficult to redirect  Volume:  Normal  Mood:  Anxious and Depressed  Affect:  Constricted  Thought Process:  Disorganized and Tangential  Orientation:  Full (Time, Place, and Person)  Thought  Content:  Hallucinations: Visual  Suicidal Thoughts:  No  Homicidal Thoughts:  No  Memory:  Immediate;   Fair  Judgement:  Impaired  Insight:  Lacking  Psychomotor Activity:  Psychomotor Retardation  Concentration:  Fair  Recall:  Poor  Fund of Knowledge:Fair  Language: Fair  Akathisia:  No  Handed:  Right  AIMS (if indicated):    Assets:  Community education officerCommunication Skills Housing Transportation  ADL's:  Intact  Cognition: Impaired,  Mild  Sleep:  poor    Treatment Plan Summary: Medication management   Discussed with patient about the medications treatment risks benefits and alternatives. He reported that he is compliant with her medications and will take them as prescribed.  I started him on Zoloft 25 mg daily for his depression and he reported that he has taken Zoloft in the past as well. I will also start him on Seroquel 25 mg by mouth daily at  bedtime for insomnia and hallucinations. He reported that he has heard about the Seroquel in the past. He does not remember if he has tried this medication.  He will follow up in 2 weeks or earlier depending on his symptoms. Discussed with him about side effects of the medication in detail and he demonstrated understanding   More than 50% of the time spent in psychoeducation, counseling and coordination of care.  Time spent with the patient 1 hour.    This note was generated in part or whole with voice recognition software. Voice regonition is usually quite accurate but there are transcription errors that can and very often do occur. I apologize for any typographical errors that were not detected and corrected.    Brandy Hale, MD 4/6/20178:48 AM

## 2016-02-04 ENCOUNTER — Ambulatory Visit (INDEPENDENT_AMBULATORY_CARE_PROVIDER_SITE_OTHER): Payer: 59 | Admitting: Psychiatry

## 2016-02-04 ENCOUNTER — Encounter: Payer: Self-pay | Admitting: Psychiatry

## 2016-02-04 VITALS — BP 108/82 | HR 74 | Temp 97.6°F | Ht 69.0 in | Wt 201.4 lb

## 2016-02-04 DIAGNOSIS — F203 Undifferentiated schizophrenia: Secondary | ICD-10-CM | POA: Diagnosis not present

## 2016-02-04 DIAGNOSIS — F919 Conduct disorder, unspecified: Secondary | ICD-10-CM | POA: Diagnosis not present

## 2016-02-04 DIAGNOSIS — R4689 Other symptoms and signs involving appearance and behavior: Secondary | ICD-10-CM

## 2016-02-04 DIAGNOSIS — R4189 Other symptoms and signs involving cognitive functions and awareness: Secondary | ICD-10-CM | POA: Diagnosis not present

## 2016-02-04 MED ORDER — QUETIAPINE FUMARATE 50 MG PO TABS: 50.0000 mg | ORAL_TABLET | Freq: Every day | ORAL | Status: DC

## 2016-02-04 MED ORDER — SERTRALINE HCL 50 MG PO TABS: 50.0000 mg | ORAL_TABLET | Freq: Every day | ORAL | Status: DC

## 2016-02-04 NOTE — Progress Notes (Signed)
Psychiatric MD Progress Note  Patient Identification: Danny Mcgrath. MRN:  [id number] Date of Evaluation:  02/04/2016 Referral Source: Dr Adriana SimasErlene Quan.  Chief Complaint:   Chief Complaint    Follow-up; Medication Refill; Insomnia     Visit Diagnosis:    ICD-9-CM ICD-10-CM   1. 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 Cognitive and behavioral changes 799.59 R41.89    312.9 F91.9     History of Present Illness:   Pt is  70 year old divorced white male With history of schizophrenia presented for the follow-up appointment. He reported that he has started taking his medications. However he did not notice any adverse effects. Patient continues to be very talkative during the interview. He was talking in details about the time he spends at home. He reported that he has been watching movies and was trying to find out errors in the movies. He has been doing it for many years. Now he is automatically able to look for the error in every movie. He reported that he will spend time reading books as well. He continues to smells strongly although he appears well groomed. He reported that he slept very well only one night although he usually wakes up around 6:00 in the morning. He walks around by himself. He noted that he has problem with his memory although he was planning to go back to school. However he will forget things occasionally and he thinks that it is not a good idea to go back to school again. He reported that he has not been falling again although it was a problem in the past. He appeared more alert and calm during this interview. He currently denied having any auditory or visual hallucinations.  Associated Signs/Symptoms: Depression Symptoms:  depressed mood, insomnia, psychomotor retardation, fatigue, difficulty concentrating, hopelessness, impaired memory, anxiety, disturbed sleep, (Hypo) Manic Symptoms:  none Anxiety Symptoms:  none Psychotic Symptoms:   Hallucinations: Visual Ideas of Reference, Paranoia, PTSD Symptoms: Negative NA  Past Psychiatric History:  H/o Psychiatric admission in [place] Has seen a Psychiatrist in [place]- Does not remember the name.   Previous Psychotropic Medications: Yes  Lithium Zoloft  Substance Abuse History in the last 12 months:  No.  Consequences of Substance Abuse: Negative NA  Past Medical History:  Past Medical History  Diagnosis Date  . Chicken pox   . Bipolar disorder (HCC)   . Glaucoma   . Heart murmur   . Hypertension   . History of blood transfusion     Past Surgical History  Procedure Laterality Date  . Cholecystectomy    . Adenoidectomy    . 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 Penile prosthesis implant    . Cataract extraction w/phaco Right 01/13/2016    Procedure: CATARACT EXTRACTION PHACO AND INTRAOCULAR LENS PLACEMENT (IOC);  Surgeon: Lockie Mola, MD;  Location: [facility];  Service: Ophthalmology;  Laterality: Right;  . Eye surgery      Family Psychiatric History: He reported that his parents are deceased and his only sister is also deceased.  Family History:  Family History  Problem Relation Age of Onset  . Arthritis Mother   . Cancer Mother     lung   . Hypertension Mother   . Anxiety disorder Mother   . Depression Mother   . Arthritis Father   . Cancer Father     lung and prostate  . Hypertension Father   .  Diabetes Father   . Diabetes Sister   . Leukemia Sister     Social History:   Social History   Social History  . Marital Status: Single    Spouse Name: N/A  . Number of Children: N/A  . Years of Education: N/A   Social History Main Topics  . Smoking status: Never Smoker   . Smokeless tobacco: Never Used  . Alcohol Use: No  . Drug Use: No  . Sexual Activity: Not Currently   Other Topics Concern  . None   Social History Narrative    Additional Social History:   Patient was married 3 times. His first marriage was in 43. He reported that she passed away. Second time he got married in 1989 and it lasted  lasted for 5 years. His third marriage was in 2009. He reported that he does not have any children as " he was unable to produce  sperms". .  Allergies:  No Known Allergies  Metabolic Disorder Labs: Lab Results  Component Value Date   HGBA1C 5.5 07/31/2015   No results found for: PROLACTIN Lab Results  Component Value Date   CHOL 172 07/31/2015   TRIG 87.0 07/31/2015   HDL 39.80 07/31/2015   CHOLHDL 4 07/31/2015   VLDL 17.4 07/31/2015   LDLCALC 115* 07/31/2015     Current Medications: Current Outpatient Prescriptions  Medication Sig Dispense Refill  . amLODipine (NORVASC) 5 MG tablet Take 1 tablet (5 mg total) by mouth daily. 90 tablet 3  . aspirin EC 81 MG tablet Take 1 tablet (81 mg total) by mouth daily. 90 tablet 3  . atorvastatin (LIPITOR) 40 MG tablet Take 1 tablet (40 mg total) by mouth daily. 90 tablet 3  . QUEtiapine (SEROQUEL) 25 MG tablet Take 1 tablet (25 mg total) by mouth at bedtime. 30 tablet 1  . sertraline (ZOLOFT) 25 MG tablet Take 1 tablet (25 mg total) by mouth daily. 30 tablet 1   No current facility-administered medications for this visit.    Neurologic: Headache: No Seizure: No Paresthesias:No  Musculoskeletal: Strength & Muscle Tone: within normal limits Gait & Station: normal Patient leans: N/A  Psychiatric Specialty Exam: ROS   Blood pressure 108/82, pulse 74, temperature 97.6 F (36.4 C), temperature source Tympanic, height  (1.753 m), weight 201 lb 6.4 oz (91.354 kg), SpO2 94 %.Body mass index is 29.73 kg/(m^2).  General Appearance: Bad Body Odor  Eye Contact:  Fair  Speech:  Garbled and Pressured  Volume:  Normal  Mood:  Anxious and Depressed  Affect:  Congruent  Thought Process:  Disorganized and Tangential  Orientation:  Full (Time, Place, and Person)  Thought Content:  WDL   Suicidal Thoughts:  No  Homicidal Thoughts:  No  Memory:  Immediate;   Fair  Judgement:  Impaired  Insight:  Lacking  Psychomotor Activity:  Psychomotor Retardation  Concentration:  Fair  Recall:  Poor  Fund of Knowledge:Fair  Language: Fair  Akathisia:  No  Handed:  Right  AIMS (if indicated):    Assets:  Community education officer  ADL's:  Intact  Cognition: Impaired,  Mild  Sleep:  poor    Treatment Plan Summary: Medication management   Discussed with patient about the medications treatment risks benefits and alternatives. He reported that he is compliant with her medications and will take them as prescribed.  I will continue him on on Zoloft 25 mg daily for his depression and then titrate the dose to 50 mg  and he will run out of his prescription.   I will also start him on Seroquel 50  mg by mouth daily at bedtime for insomnia and hallucinations. He was given written instructions about the medications.   He will follow up in 3  weeks or earlier depending on his symptoms.  Discussed with him about side effects of the medication in detail and he demonstrated understanding   More than 50% of the time spent in psychoeducation, counseling and coordination of care.       This note was generated in part or whole with voice recognition software. Voice regonition is usually quite accurate but there are transcription errors that can and very often do occur. I apologize for any typographical errors that were not detected and corrected.    Brandy HaleUzma Soundra Lampley, MD 4/20/201710:38 AM

## 2016-02-25 ENCOUNTER — Ambulatory Visit: Payer: Medicare Other | Admitting: Psychiatry

## 2016-03-02 ENCOUNTER — Ambulatory Visit (INDEPENDENT_AMBULATORY_CARE_PROVIDER_SITE_OTHER): Payer: 59 | Admitting: Psychiatry

## 2016-03-02 ENCOUNTER — Encounter: Payer: Self-pay | Admitting: Psychiatry

## 2016-03-02 VITALS — BP 138/72 | HR 83 | Temp 97.8°F | Ht 69.0 in | Wt 196.8 lb

## 2016-03-02 DIAGNOSIS — R4689 Other symptoms and signs involving appearance and behavior: Secondary | ICD-10-CM

## 2016-03-02 DIAGNOSIS — F203 Undifferentiated schizophrenia: Secondary | ICD-10-CM | POA: Diagnosis not present

## 2016-03-02 DIAGNOSIS — R4189 Other symptoms and signs involving cognitive functions and awareness: Secondary | ICD-10-CM | POA: Diagnosis not present

## 2016-03-02 DIAGNOSIS — F919 Conduct disorder, unspecified: Secondary | ICD-10-CM

## 2016-03-02 MED ORDER — QUETIAPINE FUMARATE 50 MG PO TABS: 50.0000 mg | ORAL_TABLET | Freq: Every day | ORAL | Status: DC

## 2016-03-02 MED ORDER — RIVASTIGMINE TARTRATE 1.5 MG PO CAPS: 1.5000 mg | ORAL_CAPSULE | Freq: Two times a day (BID) | ORAL | Status: DC

## 2016-03-02 MED ORDER — SERTRALINE HCL 50 MG PO TABS: 50.0000 mg | ORAL_TABLET | Freq: Every day | ORAL | Status: DC

## 2016-03-02 NOTE — Progress Notes (Signed)
Psychiatric MD Progress Note  Patient Identification: Danny Mcgrath. MRN:  [id number] Date of Evaluation:  03/02/2016 Referral Source: Dr Adriana SimasErlene Quan.  Chief Complaint:   Chief Complaint    Follow-up; Medication Refill; Depression     Visit Diagnosis:    ICD-9-CM ICD-10-CM   1. 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 Cognitive and behavioral changes 799.59 R41.89    312.9 F91.9     History of Present Illness:   Pt is  70 year old divorced white male With history of schizophrenia presented for the follow-up appointment. He reported that he has started taking his medications. However He stated that he has been doing well on his medications. He reported that the [facility] called him yesterday as they were asking about his medication refill. He reported that the Seroquel helps him and he sleeps well on the medication and his mood symptoms are improving. He appeared more calm and has coherent speech. He reported that he is experiencing some memory issues at this time and he will forget things quickly. He is interested in taking medications to help with his memory problems. Patient continues to have strong smell during this interview . He reported that he cooks for himself. He was talking about different food he is able to take for himself. He currently denied having any suicidal ideations or plans. He reported that his memory is not improving and he wants medications for the same. He currently denied having any suicidal ideations or plans.  However he will forget things occasionally and he thinks that it is not a good idea to go back to school again. He reported that he has not been falling again although it was a problem in the past. He appeared more alert and calm during this interview. He currently denied having any auditory or visual hallucinations.  Associated Signs/Symptoms: Depression Symptoms:  depressed mood, insomnia, psychomotor  retardation, fatigue, difficulty concentrating, hopelessness, impaired memory, anxiety, disturbed sleep, (Hypo) Manic Symptoms:  none Anxiety Symptoms:  none Psychotic Symptoms:  Hallucinations: Visual Ideas of Reference, Paranoia, PTSD Symptoms: Negative NA  Past Psychiatric History:  H/o Psychiatric admission in [place] Has seen a Psychiatrist in [place]- Does not remember the name.   Previous Psychotropic Medications: Yes  Lithium Zoloft  Substance Abuse History in the last 12 months:  No.  Consequences of Substance Abuse: Negative NA  Past Medical History:  Past Medical History  Diagnosis Date  . Chicken pox   . Bipolar disorder (HCC)   . Glaucoma   . Heart murmur   . Hypertension   . History of blood transfusion     Past Surgical History  Procedure Laterality Date  . Cholecystectomy    . Adenoidectomy    . 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 Penile prosthesis implant    . Cataract extraction w/phaco Right 01/13/2016    Procedure: CATARACT EXTRACTION PHACO AND INTRAOCULAR LENS PLACEMENT (IOC);  Surgeon: Lockie Mola, MD;  Location: [facility];  Service: Ophthalmology;  Laterality: Right;  . Eye surgery      Family Psychiatric History: He reported that his parents are deceased and his only sister is also deceased.  Family History:  Family History  Problem Relation Age of Onset  . Arthritis Mother   . Cancer Mother     lung   . Hypertension Mother   . Anxiety disorder Mother   . Depression Mother   . Arthritis Father   .  Cancer Father     lung and prostate  . Hypertension Father   . Diabetes Father   . Diabetes Sister   . Leukemia Sister     Social History:   Social History   Social History  . Marital Status: Single    Spouse Name: N/A  . Number of Children: N/A  . Years of Education: N/A   Social History Main Topics  . Smoking status: Never Smoker   . Smokeless  tobacco: Never Used  . Alcohol Use: No  . Drug Use: No  . Sexual Activity: Not Currently   Other Topics Concern  . None   Social History Narrative    Additional Social History:  Patient was married 3 times. His first marriage was in [id number]. He reported that she passed away. Second time he got married in 1989 and it lasted  lasted for 5 years. His third marriage was in 2009. He reported that he does not have any children as " he was unable to produce  sperms". .  Allergies:  No Known Allergies  Metabolic Disorder Labs: Lab Results  Component Value Date   HGBA1C 5.5 07/31/2015   No results found for: PROLACTIN Lab Results  Component Value Date   CHOL 172 07/31/2015   TRIG 87.0 07/31/2015   HDL 39.80 07/31/2015   CHOLHDL 4 07/31/2015   VLDL 17.4 07/31/2015   LDLCALC 115* 07/31/2015     Current Medications: Current Outpatient Prescriptions  Medication Sig Dispense Refill  . amLODipine (NORVASC) 5 MG tablet Take 1 tablet (5 mg total) by mouth daily. 90 tablet 3  . aspirin EC 81 MG tablet Take 1 tablet (81 mg total) by mouth daily. 90 tablet 3  . atorvastatin (LIPITOR) 40 MG tablet Take 1 tablet (40 mg total) by mouth daily. 90 tablet 3  . QUEtiapine (SEROQUEL) 50 MG tablet Take 1 tablet (50 mg total) by mouth at bedtime. 30 tablet 0  . sertraline (ZOLOFT) 50 MG tablet Take 1 tablet (50 mg total) by mouth daily. 30 tablet 0   No current facility-administered medications for this visit.    Neurologic: Headache: No Seizure: No Paresthesias:No  Musculoskeletal: Strength & Muscle Tone: within normal limits Gait & Station: normal Patient leans: N/A  Psychiatric Specialty Exam: ROS   Blood pressure 138/72, pulse 83, temperature 97.8 F (36.6 C), temperature source Tympanic, height 5\' 9"  (1.753 m), weight 196 lb 12.8 oz (89.268 kg), SpO2 92 %.Body mass index is 29.05 kg/(m^2).  General Appearance: Bad Body Odor  Eye Contact:  Fair  Speech:  Garbled and Pressured   Volume:  Normal  Mood:  Anxious and Depressed  Affect:  Congruent  Thought Process:  Disorganized and Tangential  Orientation:  Full (Time, Place, and Person)  Thought Content:  WDL  Suicidal Thoughts:  No  Homicidal Thoughts:  No  Memory:  Immediate;   Fair  Judgement:  Impaired  Insight:  Lacking  Psychomotor Activity:  Psychomotor Retardation  Concentration:  Fair  Recall:  Poor  Fund of Knowledge:Fair  Language: Fair  Akathisia:  No  Handed:  Right  AIMS (if indicated):    Assets:  Community education officerCommunication Skills Housing Transportation  ADL's:  Intact  Cognition: Impaired,  Mild  Sleep:  poor    Treatment Plan Summary: Medication management   Discussed with patient about the medications treatment risks benefits and alternatives. He reported that he is compliant with her medications and will take them as prescribed.  I will continue him on  on Zoloft 50 mg daily. Continue Seroquel 50  mg by mouth daily at bedtime for insomnia and hallucinations. He was given written instructions about the medications.  Medications refilled for the [facility] I will  start him on Exelon 1.5 mg by mouth twice a day for his memory issues   He will follow up in 4   weeks or earlier depending on his symptoms.  Discussed with him about side effects of the medication in detail and he demonstrated understanding   More than 50% of the time spent in psychoeducation, counseling and coordination of care.       This note was generated in part or whole with voice recognition software. Voice regonition is usually quite accurate but there are transcription errors that can and very often do occur. I apologize for any typographical errors that were not detected and corrected.    Brandy Hale, MD 5/17/20179:54 AM

## 2016-03-30 ENCOUNTER — Encounter: Payer: Self-pay | Admitting: Psychiatry

## 2016-03-30 ENCOUNTER — Ambulatory Visit (INDEPENDENT_AMBULATORY_CARE_PROVIDER_SITE_OTHER): Payer: 59 | Admitting: Psychiatry

## 2016-03-30 VITALS — BP 130/80 | HR 81 | Temp 98.6°F | Ht 69.0 in | Wt 199.8 lb

## 2016-03-30 DIAGNOSIS — R4689 Other symptoms and signs involving appearance and behavior: Secondary | ICD-10-CM

## 2016-03-30 DIAGNOSIS — F919 Conduct disorder, unspecified: Secondary | ICD-10-CM

## 2016-03-30 DIAGNOSIS — R4189 Other symptoms and signs involving cognitive functions and awareness: Secondary | ICD-10-CM

## 2016-03-30 DIAGNOSIS — F203 Undifferentiated schizophrenia: Secondary | ICD-10-CM

## 2016-03-30 MED ORDER — ESCITALOPRAM OXALATE 5 MG PO TABS: 5.0000 mg | ORAL_TABLET | Freq: Every day | ORAL | Status: DC

## 2016-03-30 MED ORDER — RIVASTIGMINE TARTRATE 3 MG PO CAPS: 3.0000 mg | ORAL_CAPSULE | Freq: Two times a day (BID) | ORAL | Status: DC

## 2016-03-30 NOTE — Progress Notes (Signed)
Psychiatric MD Progress Note  Patient Identification: Danny MathJoseph Shreiner Jr. MRN:  [id number] Date of Evaluation:  03/30/2016 Referral Source: Dr Adriana SimasookErlene Quan- [place].  Chief Complaint:   Chief Complaint    Follow-up; Medication Refill; Other     Visit Diagnosis:    ICD-9-CM ICD-10-CM   1. 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 Cognitive and behavioral changes 799.59 R41.89    312.9 F91.9     History of Present Illness:   Pt is  70 year old divorced white male With history of schizophrenia presented for the follow-up appointment. He reported that he has started taking his medications. However He continues to smell strongly during the interview. Patient reported that he takes showers on a regular basis. He currently lives with cat and  dog. Patient reported that his dog does not go out for his daily routines and stays inside the house. He reported that his cat has a litter box but he does not clean the later box on a daily basis. Patient stated that he cannot smell or taste things. I discussed with him about his strong smell and he acknowledged. He was apologetic for the same. He also mentioned that he does not wash his clothes on a regular basis. He goes to the laundromat  every 3-4 weeks as there are too many people over there. He stated that he will asked the lady and will try to go every Tuesday or Wednesday over there. We discussed about the personal hygiene and a strong smell and he was open to discussion and he reported that he will be careful .  He likes drawing things on his board and also reading books. He currently denied having any suicidal ideations or plans. He reported that the medications are helping him and he might be feeling depressed and is willing to take the medication for depression. We discussed about the Lexapro and he agreed with the plan. He will follow up in 2 weeks or earlier. He reported that he will go to the lab program and will start washing his clothes on a  regular basis. He stated that he has been doing well on his medications. He reported that the [facility] called him yesterday as they were asking about his medication refill. He reported that the Seroquel helps him and he sleeps well on the medication and his mood symptoms are improving. He appeared more calm and has coherent speech. He reported that he is experiencing some memory issues at this time and he will forget things quickly. He is interested in taking medications to help with his memory problems   He currently denied having any auditory or visual hallucinations.  Associated Signs/Symptoms: Depression Symptoms:  depressed mood, insomnia, psychomotor retardation, fatigue, difficulty concentrating, hopelessness, impaired memory, anxiety, disturbed sleep, (Hypo) Manic Symptoms:  none Anxiety Symptoms:  none Psychotic Symptoms:  Hallucinations: Visual Ideas of Reference, Paranoia, PTSD Symptoms: Negative NA  Past Psychiatric History:  H/[place]iatric [place] in [place] in [place]- Does not remember the name.   Previous Psychotropic Medications: Yes  Lithium Zoloft  Substance Abuse History in the last 12 months:  No.  Consequences of Substance Abuse: Negative NA  Past Medical History:  Past Medical History  Diagnosis Date  . Chicken pox   . Bipolar disorder (HCC)   . Glaucoma   . Heart murmur   . Hypertension   . History of blood transfusion     Past Surgical History  Procedure Laterality Date  . Cholecystectomy    .  Adenoidectomy    . 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 Penile prosthesis implant    . Cataract extraction w/phaco Right 01/13/2016    Procedure: CATARACT EXTRACTION PHACO AND INTRAOCULAR LENS PLACEMENT (IOC);  Surgeon: Lockie Mola, MD;  Location: [facility];  Service: Ophthalmology;  Laterality: Right;  . Eye surgery      Family Psychiatric  History: He reported that his parents are deceased and his only sister is also deceased.  Family History:  Family History  Problem Relation Age of Onset  . Arthritis Mother   . Cancer Mother     lung   . Hypertension Mother   . Anxiety disorder Mother   . Depression Mother   . Arthritis Father   . Cancer Father     lung and prostate  . Hypertension Father   . Diabetes Father   . Diabetes Sister   . Leukemia Sister     Social History:   Social History   Social History  . Marital Status: Single    Spouse Name: N/A  . Number of Children: N/A  . Years of Education: N/A   Social History Main Topics  . Smoking status: Never Smoker   . Smokeless tobacco: Never Used  . Alcohol Use: No  . Drug Use: No  . Sexual Activity: Not Currently   Other Topics Concern  . None   Social History Narrative    Additional Social History:  Patient was married 3 times. His first marriage was in 5. He reported that she passed away. Second time he got married in 1989 and it lasted  lasted for 5 years. His third marriage was in 2009. He reported that he does not have any children as " he was unable to produce  sperms". .  Allergies:  No Known Allergies  Metabolic Disorder Labs: Lab Results  Component Value Date   HGBA1C 5.5 07/31/2015   No results found for: PROLACTIN Lab Results  Component Value Date   CHOL 172 07/31/2015   TRIG 87.0 07/31/2015   HDL 39.80 07/31/2015   CHOLHDL 4 07/31/2015   VLDL 17.4 07/31/2015   LDLCALC 115* 07/31/2015     Current Medications: Current Outpatient Prescriptions  Medication Sig Dispense Refill  . amLODipine (NORVASC) 5 MG tablet Take 1 tablet (5 mg total) by mouth daily. 90 tablet 3  . aspirin EC 81 MG tablet Take 1 tablet (81 mg total) by mouth daily. 90 tablet 3  . atorvastatin (LIPITOR) 40 MG tablet Take 1 tablet (40 mg total) by mouth daily. 90 tablet 3  . QUEtiapine (SEROQUEL) 50 MG tablet Take 1 tablet (50 mg total) by mouth at  bedtime. 90 tablet 1  . rivastigmine (EXELON) 3 MG capsule Take 1 capsule (3 mg total) by mouth 2 (two) times daily. 60 capsule 1  . escitalopram (LEXAPRO) 5 MG tablet Take 1 tablet (5 mg total) by mouth daily. 30 tablet 1   No current facility-administered medications for this visit.    Neurologic: Headache: No Seizure: No Paresthesias:No  Musculoskeletal: Strength & Muscle Tone: within normal limits Gait & Station: normal Patient leans: N/A  Psychiatric Specialty Exam: ROS   Blood pressure 130/80, pulse 81, temperature 98.6 F (37 C), temperature source Tympanic, height 5\' 9"  (1.753 m), weight 199 lb 12.8 oz (90.629 kg), SpO2 90 %.Body mass index is 29.49 kg/(m^2).  General Appearance: Bad Body Odor  Eye Contact:  Fair  Speech:  Garbled and Pressured  Volume:  Normal  Mood:  Anxious and Depressed  Affect:  Congruent  Thought Process:  Disorganized and Tangential  Orientation:  Full (Time, Place, and Person)  Thought Content:  WDL  Suicidal Thoughts:  No  Homicidal Thoughts:  No  Memory:  Immediate;   Fair  Judgement:  Impaired  Insight:  Lacking  Psychomotor Activity:  Psychomotor Retardation  Concentration:  Fair  Recall:  Poor  Fund of Knowledge:Fair  Language: Fair  Akathisia:  No  Handed:  Right  AIMS (if indicated):    Assets:  Community education officer  ADL's:  Intact  Cognition: Impaired,  Mild  Sleep:  poor    Treatment Plan Summary: Medication management   Discussed with patient about the medications treatment risks benefits and alternatives. He reported that he is compliant with her medications and will take them as prescribed.  Continue Seroquel 50  mg by mouth daily at bedtime for insomnia and hallucinations. He was given written instructions about the medications.  Medications refilled for the [facility] I will  start him on Exelon 3 mg by mouth twice a day for his memory issues I will start him on Lexapro 5 mg daily as  he was not taking this Zoloft.   He will follow up in 2   weeks or earlier depending on his symptoms.  Discussed with him about side effects of the medication in detail and he demonstrated understanding   More than 50% of the time spent in psychoeducation, counseling and coordination of care.       This note was generated in part or whole with voice recognition software. Voice regonition is usually quite accurate but there are transcription errors that can and very often do occur. I apologize for any typographical errors that were not detected and corrected.    Brandy Hale, MD 6/14/201711:10 AM

## 2016-04-15 ENCOUNTER — Encounter: Payer: Self-pay | Admitting: Psychiatry

## 2016-04-15 ENCOUNTER — Ambulatory Visit (INDEPENDENT_AMBULATORY_CARE_PROVIDER_SITE_OTHER): Payer: 59 | Admitting: Psychiatry

## 2016-04-15 VITALS — BP 122/80 | HR 77 | Temp 98.3°F | Ht 69.0 in | Wt 202.6 lb

## 2016-04-15 DIAGNOSIS — F203 Undifferentiated schizophrenia: Secondary | ICD-10-CM | POA: Diagnosis not present

## 2016-04-15 DIAGNOSIS — F919 Conduct disorder, unspecified: Secondary | ICD-10-CM

## 2016-04-15 DIAGNOSIS — R4689 Other symptoms and signs involving appearance and behavior: Secondary | ICD-10-CM

## 2016-04-15 DIAGNOSIS — R4189 Other symptoms and signs involving cognitive functions and awareness: Secondary | ICD-10-CM

## 2016-04-15 MED ORDER — RIVASTIGMINE TARTRATE 4.5 MG PO CAPS: 4.5000 mg | ORAL_CAPSULE | Freq: Two times a day (BID) | ORAL | Status: DC

## 2016-04-15 NOTE — Progress Notes (Signed)
Psychiatric MD Progress Note  Patient Identification: Danny Mcgrath. MRN:  [id number] Date of Evaluation:  04/15/2016 Referral Source: Dr Adriana SimasErlene Quan.  Chief Complaint:   Chief Complaint    Follow-up; Medication Refill     Visit Diagnosis:    ICD-9-CM ICD-10-CM   1. 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 Cognitive and behavioral changes 799.59 R41.89    312.9 F91.9     History of Present Illness:   Pt is  70 year old divorced white male With history of schizophrenia presented for the follow-up appointment. He reported that he has started taking his medications. However He has shown some improvement in his physical behavior and does not smell as much as the last time. He reported that he washed  his clothes and appeared well groomed. He reported that he has been compliant with his medications. Patient reported that he is having some memory problems. He reported that sometimes he forgets to take his medications and we discussed about buying a pillbox and he agreed with the plan. Patient reported that he has been cleaning his house on a daily basis as well. He currently denied having any suicidal ideations or plans. Patient reported that he has been helping himself and is focused on getting better at this time. He spends time watching TV and reading books. He is more calm and alert during this interview.   Marland Kitchen  He likes drawing things on his board and also reading books.  He is interested in taking medications to help with his memory problems He currently denied having any auditory or visual hallucinations.  Associated Signs/Symptoms: Depression Symptoms:  depressed mood, psychomotor retardation, fatigue, difficulty concentrating, hopelessness, impaired memory, anxiety, disturbed sleep, (Hypo) Manic Symptoms:  none Anxiety Symptoms:  none Psychotic Symptoms:  Ideas of Reference, Paranoia, PTSD Symptoms: Negative NA  Past Psychiatric History:  H/o  Psychiatric admission in [place] Has seen a Psychiatrist in [place]- Does not remember the name.   Previous Psychotropic Medications: Yes  Lithium Zoloft  Substance Abuse History in the last 12 months:  No.  Consequences of Substance Abuse: Negative NA  Past Medical History:  Past Medical History  Diagnosis Date  . Chicken pox   . Bipolar disorder (HCC)   . Glaucoma   . Heart murmur   . Hypertension   . History of blood transfusion     Past Surgical History  Procedure Laterality Date  . Cholecystectomy    . Adenoidectomy    . 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 Penile prosthesis implant    . Cataract extraction w/phaco Right 01/13/2016    Procedure: CATARACT EXTRACTION PHACO AND INTRAOCULAR LENS PLACEMENT (IOC);  Surgeon: Lockie Mola, MD;  Location: [facility];  Service: Ophthalmology;  Laterality: Right;  . Eye surgery      Family Psychiatric History: He reported that his parents are deceased and his only sister is also deceased.  Family History:  Family History  Problem Relation Age of Onset  . Arthritis Mother   . Cancer Mother     lung   . Hypertension Mother   . Anxiety disorder Mother   . Depression Mother   . Arthritis Father   . Cancer Father     lung and prostate  . Hypertension Father   . Diabetes Father   . Diabetes Sister   . Leukemia Sister     Social History:   Social History  Social History  . Marital Status: Single    Spouse Name: N/A  . Number of Children: N/A  . Years of Education: N/A   Social History Main Topics  . Smoking status: Never Smoker   . Smokeless tobacco: Never Used  . Alcohol Use: No  . Drug Use: No  . Sexual Activity: Not Currently   Other Topics Concern  . None   Social History Narrative    Additional Social History:  Patient was married 3 times. His first marriage was in [id number]. He reported that she passed away. Second time he got  married in 1989 and it lasted  lasted for 5 years. His third marriage was in 2009  Allergies:  No Known Allergies  Metabolic Disorder Labs: Lab Results  Component Value Date   HGBA1C 5.5 07/31/2015   No results found for: PROLACTIN Lab Results  Component Value Date   CHOL 172 07/31/2015   TRIG 87.0 07/31/2015   HDL 39.80 07/31/2015   CHOLHDL 4 07/31/2015   VLDL 17.4 07/31/2015   LDLCALC 115* 07/31/2015     Current Medications: Current Outpatient Prescriptions  Medication Sig Dispense Refill  . amLODipine (NORVASC) 5 MG tablet Take 1 tablet (5 mg total) by mouth daily. 90 tablet 3  . aspirin EC 81 MG tablet Take 1 tablet (81 mg total) by mouth daily. 90 tablet 3  . atorvastatin (LIPITOR) 40 MG tablet Take 1 tablet (40 mg total) by mouth daily. 90 tablet 3  . escitalopram (LEXAPRO) 5 MG tablet Take 1 tablet (5 mg total) by mouth daily. 30 tablet 1  . QUEtiapine (SEROQUEL) 50 MG tablet Take 1 tablet (50 mg total) by mouth at bedtime. 90 tablet 1  . rivastigmine (EXELON) 3 MG capsule Take 1 capsule (3 mg total) by mouth 2 (two) times daily. 60 capsule 1  . SERTRALINE HCL PO Take 50 mg by mouth.     No current facility-administered medications for this visit.    Neurologic: Headache: No Seizure: No Paresthesias:No  Musculoskeletal: Strength & Muscle Tone: within normal limits Gait & Station: normal Patient leans: N/A  Psychiatric Specialty Exam: ROS   Blood pressure 122/80, pulse 77, temperature 98.3 F (36.8 C), temperature source Tympanic, height 5\' 9"  (1.753 m), weight 202 lb 9.6 oz (91.899 kg), SpO2 91 %.Body mass index is 29.91 kg/(m^2).  General Appearance: Bad Body Odor  Eye Contact:  Fair  Speech:  Clear and Coherent  Volume:  Normal  Mood:  Anxious and Depressed  Affect:  Congruent  Thought Process:  Coherent  Orientation:  Full (Time, Place, and Person)  Thought Content:  WDL  Suicidal Thoughts:  No  Homicidal Thoughts:  No  Memory:  Immediate;   Fair   Judgement:  Impaired  Insight:  Lacking  Psychomotor Activity:  Psychomotor Retardation  Concentration:  Fair  Recall:  Poor  Fund of Knowledge:Fair  Language: Fair  Akathisia:  No  Handed:  Right  AIMS (if indicated):    Assets:  Community education officerCommunication Skills Housing Transportation  ADL's:  Intact  Cognition: Impaired,  Mild  Sleep:  poor    Treatment Plan Summary: Medication management   Discussed with patient about the medications treatment risks benefits and alternatives. He reported that he is compliant with her medications and will take them as prescribed.  Continue Seroquel 50  mg by mouth daily at bedtime for insomnia and hallucinations.  Advised to get the pill box.  I will  start him on Exelon 4.5  mg  by mouth twice a day for his memory issues Continue Lexapro 5 mg daily    He will follow up in 2   weeks or earlier depending on his symptoms.  Discussed with him about side effects of the medication in detail and he demonstrated understanding   More than 50% of the time spent in psychoeducation, counseling and coordination of care.       This note was generated in part or whole with voice recognition software. Voice regonition is usually quite accurate but there are transcription errors that can and very often do occur. I apologize for any typographical errors that were not detected and corrected.    Brandy HaleUzma Caleyah Jr, MD 6/30/201710:29 AM

## 2016-04-29 ENCOUNTER — Ambulatory Visit: Payer: 59 | Admitting: Psychiatry

## 2016-05-05 ENCOUNTER — Ambulatory Visit (INDEPENDENT_AMBULATORY_CARE_PROVIDER_SITE_OTHER): Payer: 59 | Admitting: Psychiatry

## 2016-05-05 ENCOUNTER — Encounter: Payer: Self-pay | Admitting: Psychiatry

## 2016-05-05 VITALS — BP 140/82 | HR 104 | Temp 98.0°F | Ht 69.0 in | Wt 197.2 lb

## 2016-05-05 DIAGNOSIS — F203 Undifferentiated schizophrenia: Secondary | ICD-10-CM

## 2016-05-05 DIAGNOSIS — F919 Conduct disorder, unspecified: Secondary | ICD-10-CM | POA: Diagnosis not present

## 2016-05-05 DIAGNOSIS — R4689 Other symptoms and signs involving appearance and behavior: Secondary | ICD-10-CM

## 2016-05-05 DIAGNOSIS — R4189 Other symptoms and signs involving cognitive functions and awareness: Secondary | ICD-10-CM

## 2016-05-05 MED ORDER — ESCITALOPRAM OXALATE 5 MG PO TABS: 5.0000 mg | ORAL_TABLET | Freq: Every day | ORAL | Status: DC

## 2016-05-05 MED ORDER — RIVASTIGMINE TARTRATE 6 MG PO CAPS: 6.0000 mg | ORAL_CAPSULE | Freq: Two times a day (BID) | ORAL | Status: DC

## 2016-05-05 NOTE — Progress Notes (Signed)
Psychiatric MD Progress Note  Patient Identification: Danny Mcgrath. MRN:  [id number] Date of Evaluation:  05/05/2016 Referral Source: Dr Adriana SimasErlene Quan.  Chief Complaint:   Chief Complaint    Follow-up; Medication Refill     Visit Diagnosis:    ICD-9-CM ICD-10-CM   1. 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 Cognitive and behavioral changes 799.59 R41.89    312.9 F91.9     History of Present Illness:   Pt is  70 year old divorced white male With history of schizophrenia presented for the follow-up appointment. He reported that he has started taking his medications. He appeared calm and alert during the interview. He has started showing improvement in his behavior and does not have any side effects of the medications. His memory is also improving. He was able to wear clean clothes and does not have any body order. He was calm and collective and brought the list of his medications. He reported that he feels that the medications are helping with his memory as well. He currently denied having any suicidal ideations or plans. He spends time reading books and watching TV.   She currently denied having any suicidal homicidal ideations or plans. He denied having any hallucinations. We discussed at length about the different issues and he agreed with the plan.  Associated Signs/Symptoms: Depression Symptoms:  depressed mood, psychomotor retardation, fatigue, difficulty concentrating, hopelessness, impaired memory, anxiety, disturbed sleep, (Hypo) Manic Symptoms:  none Anxiety Symptoms:  none Psychotic Symptoms:  Ideas of Reference, Paranoia, PTSD Symptoms: Negative NA  Past Psychiatric History:  H/o Psychiatric admission in [place] Has seen a Psychiatrist in [place]- Does not remember the name.   Previous Psychotropic Medications: Yes  Lithium Zoloft  Substance Abuse History in the last 12 months:  No.  Consequences of Substance  Abuse: Negative NA  Past Medical History:  Past Medical History  Diagnosis Date  . Chicken pox   . Bipolar disorder (HCC)   . Glaucoma   . Heart murmur   . Hypertension   . History of blood transfusion     Past Surgical History  Procedure Laterality Date  . Cholecystectomy    . Adenoidectomy    . 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 Penile prosthesis implant    . Cataract extraction w/phaco Right 01/13/2016    Procedure: CATARACT EXTRACTION PHACO AND INTRAOCULAR LENS PLACEMENT (IOC);  Surgeon: Lockie Mola, MD;  Location: [facility];  Service: Ophthalmology;  Laterality: Right;  . Eye surgery      Family Psychiatric History: He reported that his parents are deceased and his only sister is also deceased.  Family History:  Family History  Problem Relation Age of Onset  . Arthritis Mother   . Cancer Mother     lung   . Hypertension Mother   . Anxiety disorder Mother   . Depression Mother   . Arthritis Father   . Cancer Father     lung and prostate  . Hypertension Father   . Diabetes Father   . Diabetes Sister   . Leukemia Sister     Social History:   Social History   Social History  . Marital Status: Single    Spouse Name: N/A  . Number of Children: N/A  . Years of Education: N/A   Social History Main Topics  . Smoking status: Never Smoker   . Smokeless tobacco: Never Used  . Alcohol Use: No  .  Drug Use: No  . Sexual Activity: Not Currently   Other Topics Concern  . None   Social History Narrative    Additional Social History:  Patient was married 3 times. His first marriage was in [id number]. He reported that she passed away. Second time he got married in 1989 and it lasted  lasted for 5 years. His third marriage was in 2009  Allergies:  No Known Allergies  Metabolic Disorder Labs: Lab Results  Component Value Date   HGBA1C 5.5 07/31/2015   No results found for: PROLACTIN Lab  Results  Component Value Date   CHOL 172 07/31/2015   TRIG 87.0 07/31/2015   HDL 39.80 07/31/2015   CHOLHDL 4 07/31/2015   VLDL 17.4 07/31/2015   LDLCALC 115* 07/31/2015     Current Medications: Current Outpatient Prescriptions  Medication Sig Dispense Refill  . amLODipine (NORVASC) 5 MG tablet Take 1 tablet (5 mg total) by mouth daily. 90 tablet 3  . aspirin EC 81 MG tablet Take 1 tablet (81 mg total) by mouth daily. 90 tablet 3  . atorvastatin (LIPITOR) 40 MG tablet Take 1 tablet (40 mg total) by mouth daily. 90 tablet 3  . escitalopram (LEXAPRO) 5 MG tablet Take 1 tablet (5 mg total) by mouth daily. 30 tablet 1  . QUEtiapine (SEROQUEL) 50 MG tablet Take 1 tablet (50 mg total) by mouth at bedtime. 90 tablet 1  . rivastigmine (EXELON) 4.5 MG capsule Take 1 capsule (4.5 mg total) by mouth 2 (two) times daily. 60 capsule 0   No current facility-administered medications for this visit.    Neurologic: Headache: No Seizure: No Paresthesias:No  Musculoskeletal: Strength & Muscle Tone: within normal limits Gait & Station: normal Patient leans: N/A  Psychiatric Specialty Exam: ROS   Blood pressure 140/82, pulse 104, temperature 98 F (36.7 C), temperature source Tympanic, height 5\' 9"  (1.753 m), weight 197 lb 3.2 oz (89.449 kg), SpO2 94 %.Body mass index is 29.11 kg/(m^2).  General Appearance: Bad Body Odor  Eye Contact:  Fair  Speech:  Clear and Coherent  Volume:  Normal  Mood:  Anxious and Depressed  Affect:  Congruent  Thought Process:  Coherent  Orientation:  Full (Time, Place, and Person)  Thought Content:  WDL  Suicidal Thoughts:  No  Homicidal Thoughts:  No  Memory:  Immediate;   Fair  Judgement:  Impaired  Insight:  Lacking  Psychomotor Activity:  Psychomotor Retardation  Concentration:  Fair  Recall:  Poor  Fund of Knowledge:Fair  Language: Fair  Akathisia:  No  Handed:  Right  AIMS (if indicated):    Assets:  Psychologist, forensicCommunication  Skills Housing Transportation  ADL's:  Intact  Cognition: Impaired,  Mild  Sleep:  poor    Treatment Plan Summary: Medication management   Discussed with patient about the medications treatment risks benefits and alternatives. He reported that he is compliant with her medications and will take them as prescribed.  Continue Seroquel 50  mg by mouth daily at bedtime for insomnia and hallucinations.  Advised to get the pill box.  I will  start him on Exelon 6 mg by mouth twice a day for his memory issues Continue Lexapro 5 mg daily   Follow-up in 2 months or earlier depending on his symptoms.  Discussed with him about side effects of the medication in detail and he demonstrated understanding   More than 50% of the time spent in psychoeducation, counseling and coordination of care.  This note was generated in part or whole with voice recognition software. Voice regonition is usually quite accurate but there are transcription errors that can and very often do occur. I apologize for any typographical errors that were not detected and corrected.    Brandy Hale, MD 7/20/20172:08 PM

## 2016-05-18 ENCOUNTER — Encounter: Payer: Self-pay | Admitting: Family Medicine

## 2016-05-18 ENCOUNTER — Ambulatory Visit (INDEPENDENT_AMBULATORY_CARE_PROVIDER_SITE_OTHER): Payer: Medicare Other | Admitting: Family Medicine

## 2016-05-18 VITALS — BP 153/94 | HR 69 | Temp 98.7°F | Wt 204.1 lb

## 2016-05-18 DIAGNOSIS — I10 Essential (primary) hypertension: Secondary | ICD-10-CM

## 2016-05-18 DIAGNOSIS — Z1159 Encounter for screening for other viral diseases: Secondary | ICD-10-CM

## 2016-05-18 DIAGNOSIS — E785 Hyperlipidemia, unspecified: Secondary | ICD-10-CM

## 2016-05-18 DIAGNOSIS — F203 Undifferentiated schizophrenia: Secondary | ICD-10-CM

## 2016-05-18 DIAGNOSIS — Z1211 Encounter for screening for malignant neoplasm of colon: Secondary | ICD-10-CM

## 2016-05-18 LAB — COMPREHENSIVE METABOLIC PANEL
ALBUMIN: 4.3 g/dL (ref 3.5–5.2)
ALT: 31 U/L (ref 0–53)
AST: 30 U/L (ref 0–37)
Alkaline Phosphatase: 149 U/L — ABNORMAL HIGH (ref 39–117)
BUN: 16 mg/dL (ref 6–23)
CALCIUM: 9.5 mg/dL (ref 8.4–10.5)
CHLORIDE: 106 meq/L (ref 96–112)
CO2: 27 mEq/L (ref 19–32)
Creatinine, Ser: 1.24 mg/dL (ref 0.40–1.50)
GFR: 60.92 mL/min (ref >60.00)
Glucose, Bld: 94 mg/dL (ref 70–99)
POTASSIUM: 4 meq/L (ref 3.5–5.1)
Sodium: 140 mEq/L (ref 135–145)
Total Bilirubin: 1 mg/dL (ref 0.2–1.2)
Total Protein: 7.6 g/dL (ref 6.0–8.3)

## 2016-05-18 LAB — LIPID PANEL
CHOLESTEROL: 116 mg/dL (ref 0–200)
HDL: 40 mg/dL (ref >39.00)
LDL CALC: 52 mg/dL (ref 0–99)
NonHDL: 75.83
TRIGLYCERIDES: 119 mg/dL (ref 0.0–149.0)
Total CHOL/HDL Ratio: 3
VLDL: 23.8 mg/dL (ref 0.0–40.0)

## 2016-05-18 NOTE — Progress Notes (Signed)
Pre visit review using our clinic review tool, if applicable. No additional management support is needed unless otherwise documented below in the visit note. 

## 2016-05-18 NOTE — Assessment & Plan Note (Signed)
Stable, improved. Still tangential at times. Continue current meds. Needs continued close follow up with Psych.

## 2016-05-18 NOTE — Assessment & Plan Note (Signed)
Stable. In need of Lipid panel today. Continue lipitor.

## 2016-05-18 NOTE — Assessment & Plan Note (Signed)
Established Problem, BP mildly elevated today. Repeat was improved. No medication changes today. Continue Norvasc 5 mg daily.

## 2016-05-18 NOTE — Progress Notes (Signed)
   Subjective:  Patient ID: Steel Kerney., male    DOB: [birth year]  Age: 70 y.o. MRN: [id number]  CC: Follow up  HPI:  70 year old male with schizophrenia, hypertension, hyperlipidemia presents for follow-up.  HTN  BP elevated today.  Compliant with Norvasc.  HLD  Stable.  On lipitor.  Needs Lipid panel today.  Schizophrenia  Stable.  Following with Psychiatry.   Currently on Seroquel, Lexapro, Exelon.  Social Hx   Social History   Social History  . Marital status: Single    Spouse name: N/A  . Number of children: N/A  . Years of education: N/A   Social History Main Topics  . Smoking status: Never Smoker  . Smokeless tobacco: Never Used  . Alcohol use No  . Drug use: No  . Sexual activity: Not Currently   Other Topics Concern  . None   Social History Narrative  . None   Review of Systems  Musculoskeletal: Positive for back pain.  Psychiatric/Behavioral:       Memory issues - improving.   Objective:  BP (!) 153/94 (BP Location: Right Arm, Patient Position: Sitting, Cuff Size: Normal)   Pulse 69   Temp 98.7 F (37.1 C) (Oral)   Wt 204 lb 2 oz (92.6 kg)   SpO2 95%   BMI 30.14 kg/m   BP/Weight 05/18/2016 05/05/2016 8/76/8115  Systolic BP 726 203 559  Diastolic BP 94 82 80  Wt. (Lbs) 204.13 197.2 202.6  BMI 30.14 29.11 29.91   Physical Exam  Constitutional: He appears well-developed. No distress.  Body odor noted.  Cardiovascular: Normal rate and regular rhythm.   2/6 systolic murmur.  Pulmonary/Chest: Effort normal and breath sounds normal.  Neurological: He is alert.  Psychiatric:  Flat affect. Tangential at times.  Vitals reviewed.  Lab Results  Component Value Date   WBC 7.0 07/31/2015   HGB 13.7 07/31/2015   HCT 42.8 07/31/2015   PLT 182.0 07/31/2015   GLUCOSE 141 (H) 07/31/2015   CHOL 172 07/31/2015   TRIG 87.0 07/31/2015   HDL 39.80 07/31/2015   LDLCALC 115 (H) 07/31/2015   ALT 15 07/31/2015   AST 19 07/31/2015   NA 139 07/31/2015   K 3.8 07/31/2015   CL 103 07/31/2015   CREATININE 1.34 07/31/2015   BUN 15 07/31/2015   CO2 27 07/31/2015   HGBA1C 5.5 07/31/2015    Assessment & Plan:   Problem List Items Addressed This Visit    HTN (hypertension) - Primary    Established Problem, BP mildly elevated today. Repeat was improved. No medication changes today. Continue Norvasc 5 mg daily.       Relevant Orders   Comp Met (CMET)   Hyperlipidemia    Stable. In need of Lipid panel today. Continue lipitor.      Relevant Orders   Lipid Profile   Undifferentiated schizophrenia ([place])    Stable, improved. Still tangential at times. Continue current meds. Needs continued close follow up with Psych.       Other Visit Diagnoses    Need for hepatitis C screening test       Relevant Orders   Hepatitis C Antibody   Colon cancer screening       Relevant Orders   Cologuard     Follow-up: 6 months.  [place]

## 2016-05-18 NOTE — Patient Instructions (Signed)
Continue your current medications.  We will call with your lab results.  Follow up in 6 months  Take care  Dr. Celena Lanius  

## 2016-05-19 LAB — HEPATITIS C ANTIBODY: HCV AB: NEGATIVE

## 2016-06-28 ENCOUNTER — Ambulatory Visit (INDEPENDENT_AMBULATORY_CARE_PROVIDER_SITE_OTHER): Payer: 59 | Admitting: Psychiatry

## 2016-06-28 ENCOUNTER — Encounter: Payer: Self-pay | Admitting: Psychiatry

## 2016-06-28 VITALS — BP 153/71 | HR 71 | Temp 98.7°F | Ht 69.0 in | Wt 205.0 lb

## 2016-06-28 DIAGNOSIS — R4189 Other symptoms and signs involving cognitive functions and awareness: Secondary | ICD-10-CM

## 2016-06-28 DIAGNOSIS — F919 Conduct disorder, unspecified: Secondary | ICD-10-CM

## 2016-06-28 DIAGNOSIS — F203 Undifferentiated schizophrenia: Secondary | ICD-10-CM

## 2016-06-28 DIAGNOSIS — R4689 Other symptoms and signs involving appearance and behavior: Secondary | ICD-10-CM

## 2016-06-28 MED ORDER — QUETIAPINE FUMARATE 50 MG PO TABS: 50.0000 mg | ORAL_TABLET | Freq: Every day | ORAL | 1 refills | Status: DC

## 2016-06-28 NOTE — Progress Notes (Signed)
Psychiatric MD Progress Note  Patient Identification: Danny MathJoseph Saber Jr. MRN:  [id number] Date of Evaluation:  06/28/2016 Referral Source: Dr Adriana SimasookErlene Quan- [place].  Chief Complaint:   Chief Complaint    Follow-up; Medication Refill     Visit Diagnosis:    ICD-9-CM ICD-10-CM   1. 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 Cognitive and behavioral changes 799.59 R41.89    312.9 F91.9     History of Present Illness:   Pt is  70 year old divorced white male With history of schizophrenia presented for the follow-up appointment. He reported that he has started taking his medications. He appeared calm and alert during the interview. He was discussing in detail about the politics and is concerned about the same. He reported improvement in his memory and behavior. He is not having any side effects of medication. He currently denied having any suicidal ideations or plans and has been sleeping well. He is wearing clean clothes and is not having any any body odor. He was calm and collective  He spends time reading books and watching TV.     Associated Signs/Symptoms: Depression Symptoms:  depressed mood, difficulty concentrating, anxiety, (Hypo) Manic Symptoms:  none Anxiety Symptoms:  none Psychotic Symptoms:  Ideas of Reference, Paranoia, PTSD Symptoms: Negative NA  Past Psychiatric History:  H/[place] in [place] in [place]- Does not remember the name.   Previous Psychotropic Medications: Yes  Lithium Zoloft  Substance Abuse History in the last 12 months:  No.  Consequences of Substance Abuse: Negative NA  Past Medical History:  Past Medical History:  Diagnosis Date  . Bipolar disorder (HCC)   . Chicken pox   . Glaucoma   . Heart murmur   . History of blood transfusion   . Hypertension     Past Surgical History:  Procedure Laterality Date  . ADENOIDECTOMY    . CATARACT EXTRACTION W/PHACO Right 01/13/2016   Procedure:  CATARACT EXTRACTION PHACO AND INTRAOCULAR LENS PLACEMENT (IOC);  Surgeon: Lockie Molahadwick Brasington, MD;  [facility];  Service: Ophthalmology;  Laterality: Right;  . CHOLECYSTECTOMY    . EYE SURGERY    . PENILE PROSTHESIS IMPLANT    . 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  Family History:  Family History  Problem Relation Age of Onset  . Arthritis Mother   . Cancer Mother     lung   . Hypertension Mother   . Anxiety disorder Mother   . Depression Mother   . Arthritis Father   . Cancer Father     lung and prostate  . Hypertension Father   . Diabetes Father   . Diabetes Sister   . Leukemia Sister     Social History:   Social History   Social History  . Marital status: Single    Spouse name: N/A  . Number of children: N/A  . Years of education: N/A   Social History Main Topics  . Smoking status: Never Smoker  . Smokeless tobacco: Never Used  . Alcohol use No  . Drug use: No  . Sexual activity: Not Currently   Other Topics Concern  . None   Social History Narrative  . None    Additional Social History:  Patient was married 3 times. His first marriage was in [id number]. He reported that  she passed away. Second time he got married in 1989 and it lasted  lasted for 5 years. His third marriage was in 2009  Allergies:  No Known Allergies  Metabolic Disorder Labs: Lab Results  Component Value Date   HGBA1C 5.5 07/31/2015   No results found for: PROLACTIN Lab Results  Component Value Date   CHOL 116 05/18/2016   TRIG 119.0 05/18/2016   HDL 40.00 05/18/2016   CHOLHDL 3 05/18/2016   VLDL 23.8 05/18/2016   LDLCALC 52 05/18/2016   LDLCALC 115 (H) 07/31/2015     Current Medications: Current Outpatient Prescriptions  Medication Sig Dispense Refill  . amLODipine (NORVASC) 5 MG tablet Take 1  tablet (5 mg total) by mouth daily. 90 tablet 3  . aspirin EC 81 MG tablet Take 1 tablet (81 mg total) by mouth daily. 90 tablet 3  . atorvastatin (LIPITOR) 40 MG tablet Take 1 tablet (40 mg total) by mouth daily. 90 tablet 3  . escitalopram (LEXAPRO) 5 MG tablet Take 1 tablet (5 mg total) by mouth daily. 90 tablet 1  . QUEtiapine (SEROQUEL) 50 MG tablet Take 1 tablet (50 mg total) by mouth at bedtime. 90 tablet 1  . rivastigmine (EXELON) 6 MG capsule Take 1 capsule (6 mg total) by mouth 2 (two) times daily. 180 capsule 1   No current facility-administered medications for this visit.     Neurologic: Headache: No Seizure: No Paresthesias:No  Musculoskeletal: Strength & Muscle Tone: within normal limits Gait & Station: normal Patient leans: N/A  Psychiatric Specialty Exam: ROS  Blood pressure (!) 153/71, pulse 71, temperature 98.7 F (37.1 C), temperature source Oral, height 5\' 9"  (1.753 m), weight 205 lb (93 kg).Body mass index is 30.27 kg/m.  General Appearance: Bad Body Odor  Eye Contact:  Fair  Speech:  Clear and Coherent  Volume:  Normal  Mood:  Anxious and Depressed  Affect:  Congruent  Thought Process:  Coherent  Orientation:  Full (Time, Place, and Person)  Thought Content:  WDL  Suicidal Thoughts:  No  Homicidal Thoughts:  No  Memory:  Immediate;   Fair  Judgement:  Impaired  Insight:  Lacking  Psychomotor Activity:  Psychomotor Retardation  Concentration:  Fair  Recall:  Poor  Fund of Knowledge:Fair  Language: Fair  Akathisia:  No  Handed:  Right  AIMS (if indicated):    Assets:  Community education officer  ADL's:  Intact  Cognition: Impaired,  Mild  Sleep:  poor    Treatment Plan Summary: Medication management   Discussed with patient about the medications treatment risks benefits and alternatives. He reported that he is compliant with her medications and will take them as prescribed.  Continue Seroquel 50  mg by mouth daily at  bedtime for insomnia and hallucinations.  Advised to get the pill box.  Continue  Exelon 6 mg by mouth twice a day for his memory issues Continue Lexapro 5 mg daily  Patient has supply of the medications. Follow-up in 2 months or earlier depending on his symptoms.  Discussed with him about side effects of the medication in detail and he demonstrated understanding   More than 50% of the time spent in psychoeducation, counseling and coordination of care.       This note was generated in part or whole with voice recognition software. Voice regonition is usually quite accurate but there are transcription errors that can and very often do occur. I apologize for any typographical errors that were not detected  and corrected.    Brandy Hale, MD 9/12/20171:14 PM

## 2016-07-06 ENCOUNTER — Other Ambulatory Visit: Payer: Self-pay | Admitting: Family Medicine

## 2016-07-20 ENCOUNTER — Other Ambulatory Visit: Payer: Self-pay | Admitting: Family Medicine

## 2016-08-22 ENCOUNTER — Other Ambulatory Visit: Payer: Self-pay | Admitting: Psychiatry

## 2016-08-25 ENCOUNTER — Encounter: Payer: Self-pay | Admitting: *Deleted

## 2016-08-25 ENCOUNTER — Encounter: Payer: Self-pay | Admitting: Psychiatry

## 2016-08-25 ENCOUNTER — Ambulatory Visit (INDEPENDENT_AMBULATORY_CARE_PROVIDER_SITE_OTHER): Payer: 59 | Admitting: Psychiatry

## 2016-08-25 VITALS — BP 118/72 | HR 76 | Ht 69.0 in | Wt 205.0 lb

## 2016-08-25 DIAGNOSIS — R4689 Other symptoms and signs involving appearance and behavior: Secondary | ICD-10-CM | POA: Diagnosis not present

## 2016-08-25 DIAGNOSIS — R4189 Other symptoms and signs involving cognitive functions and awareness: Secondary | ICD-10-CM | POA: Diagnosis not present

## 2016-08-25 DIAGNOSIS — F203 Undifferentiated schizophrenia: Secondary | ICD-10-CM | POA: Diagnosis not present

## 2016-08-25 MED ORDER — QUETIAPINE FUMARATE 50 MG PO TABS: 50.0000 mg | ORAL_TABLET | Freq: Every day | ORAL | 1 refills | Status: DC

## 2016-08-25 MED ORDER — ESCITALOPRAM OXALATE 5 MG PO TABS: 5.0000 mg | ORAL_TABLET | Freq: Every day | ORAL | 3 refills | Status: DC

## 2016-08-25 MED ORDER — RIVASTIGMINE TARTRATE 6 MG PO CAPS: 6.0000 mg | ORAL_CAPSULE | Freq: Two times a day (BID) | ORAL | 1 refills | Status: DC

## 2016-08-25 NOTE — Progress Notes (Signed)
Psychiatric MD Progress Note  Patient Identification: Danny Mcgrath. MRN:  [id number] Date of Evaluation:  08/25/2016 Referral Source: Dr Adriana SimasErlene Quan.  Chief Complaint:   Chief Complaint    Follow-up     Visit Diagnosis:    ICD-9-CM ICD-10-CM   1. 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 Cognitive and behavioral changes 799.59 R41.89    312.9 R46.89     History of Present Illness:   Pt is  70 year old divorced white male with history of schizophrenia presented for the follow-up appointment. He reported that he is Planning for the cataract surgery next week. He reported that he is going to have the surgery done on the left eye was. He reported that his neighbors are going to help him. Patient appeared calm and alert during the interview. He reported that he has to stop driving for a few days. He stated that he takes his medications as prescribed. His memory is improving with the help of Exelon. He is not having any side effects to the medications. He was showing me some of his drawings which she has been doing as a freak and crying. He stated that he has been sleeping well at night with the help of Seroquel. No side effects the medications noted at this time. He appeared well groomed. Marland Kitchen He was calm and collective  He spends time reading books and watching TV.     Associated Signs/Symptoms: Depression Symptoms:  depressed mood, difficulty concentrating, anxiety, (Hypo) Manic Symptoms:  none Anxiety Symptoms:  none Psychotic Symptoms:  Ideas of Reference, Paranoia, PTSD Symptoms: Negative NA  Past Psychiatric History:  H/o Psychiatric admission in [place] Has seen a Psychiatrist in [place]- Does not remember the name.   Previous Psychotropic Medications: Yes  Lithium Zoloft  Substance Abuse History in the last 12 months:  No.  Consequences of Substance Abuse: Negative NA  Past Medical History:  Past Medical History:  Diagnosis Date  . Bipolar  disorder (HCC)   . Chicken pox   . Glaucoma   . Heart murmur   . History of blood transfusion   . Hypertension     Past Surgical History:  Procedure Laterality Date  . ADENOIDECTOMY    . CATARACT EXTRACTION W/PHACO Right 01/13/2016   Procedure: CATARACT EXTRACTION PHACO AND INTRAOCULAR LENS PLACEMENT (IOC);  Surgeon: Lockie Mola, MD;  Location: [facility];  Service: Ophthalmology;  Laterality: Right;  . CHOLECYSTECTOMY    . EYE SURGERY    . PENILE PROSTHESIS IMPLANT    . 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  Family History:  Family History  Problem Relation Age of Onset  . Arthritis Mother   . Cancer Mother     lung   . Hypertension Mother   . Anxiety disorder Mother   . Depression Mother   . Arthritis Father   . Cancer Father     lung and prostate  . Hypertension Father   . Diabetes Father   . Diabetes Sister   . Leukemia Sister     Social History:   Social History   Social History  . Marital status: Single    Spouse name: N/A  . Number of children: N/A  . Years of education: N/A   Social History Main Topics  . Smoking status: Never Smoker  . Smokeless  tobacco: Never Used  . Alcohol use No  . Drug use: No  . Sexual activity: Not Currently   Other Topics Concern  . None   Social History Narrative  . None    Additional Social History:  Patient was married 3 times. His first marriage was in [id number]. He reported that she passed away. Second time he got married in 1989 and it lasted  lasted for 5 years. His third marriage was in 2009  Allergies:  No Known Allergies  Metabolic Disorder Labs: Lab Results  Component Value Date   HGBA1C 5.5 07/31/2015   No results found for: PROLACTIN Lab Results  Component Value Date   CHOL 116 05/18/2016   TRIG 119.0 05/18/2016   HDL 40.00  05/18/2016   CHOLHDL 3 05/18/2016   VLDL 23.8 05/18/2016   LDLCALC 52 05/18/2016   LDLCALC 115 (H) 07/31/2015     Current Medications: Current Outpatient Prescriptions  Medication Sig Dispense Refill  . amLODipine (NORVASC) 5 MG tablet TAKE 1 TABLET DAILY 90 tablet 3  . aspirin EC 81 MG tablet Take 1 tablet (81 mg total) by mouth daily. 90 tablet 3  . atorvastatin (LIPITOR) 40 MG tablet TAKE 1 TABLET DAILY 90 tablet 3  . escitalopram (LEXAPRO) 5 MG tablet Take 1 tablet (5 mg total) by mouth daily. 30 tablet 3  . QUEtiapine (SEROQUEL) 50 MG tablet Take 1 tablet (50 mg total) by mouth at bedtime. 90 tablet 1  . rivastigmine (EXELON) 6 MG capsule Take 1 capsule (6 mg total) by mouth 2 (two) times daily. 180 capsule 1   No current facility-administered medications for this visit.     Neurologic: Headache: No Seizure: No Paresthesias:No  Musculoskeletal: Strength & Muscle Tone: within normal limits Gait & Station: normal Patient leans: N/A  Psychiatric Specialty Exam: ROS  Blood pressure 118/72, pulse 76, height 5\' 9"  (1.753 m), weight 205 lb (93 kg).Body mass index is 30.27 kg/m.  General Appearance: Bad Body Odor  Eye Contact:  Fair  Speech:  Clear and Coherent  Volume:  Normal  Mood:  Anxious and Depressed  Affect:  Congruent  Thought Process:  Coherent  Orientation:  Full (Time, Place, and Person)  Thought Content:  WDL  Suicidal Thoughts:  No  Homicidal Thoughts:  No  Memory:  Immediate;   Fair  Judgement:  Good  Insight:  Fair  Psychomotor Activity:  Normal  Concentration:  Fair  Recall:  Poor  Fund of Knowledge:Fair  Language: Fair  Akathisia:  No  Handed:  Right  AIMS (if indicated):    Assets:  Community education officerCommunication Skills Housing Transportation  ADL's:  Intact  Cognition: Impaired,  Mild  Sleep:  fair    Treatment Plan Summary: Medication management   Discussed with patient about the medications treatment risks benefits and alternatives. He reported  that he is compliant with her medications and will take them as prescribed.  Continue Seroquel 50  mg by mouth daily at bedtime for insomnia and hallucinations.  Continue  Exelon 6 mg by mouth twice a day for his memory issues Continue Lexapro 5 mg daily  Follow-up in 2 months or earlier depending on his symptoms.  Discussed with him about side effects of the medication in detail and he demonstrated understanding   More than 50% of the time spent in psychoeducation, counseling and coordination of care.       This note was generated in part or whole with voice recognition software. Voice regonition is usually quite  accurate but there are transcription errors that can and very often do occur. I apologize for any typographical errors that were not detected and corrected.    Brandy HaleUzma Kennidee Heyne, MD 11/9/20172:27 PM

## 2016-09-02 NOTE — Discharge Instructions (Signed)
Cataract Surgery, Care After °Refer to this sheet in the next few weeks. These instructions provide you with information about caring for yourself after your procedure. Your health care provider may also give you more specific instructions. Your treatment has been planned according to current medical practices, but problems sometimes occur. Call your health care provider if you have any problems or questions after your procedure. °What can I expect after the procedure? °After the procedure, it is common to have: °· Itching. °· Discomfort. °· Fluid discharge. °· Sensitivity to light and to touch. °· Bruising. °Follow these instructions at home: °Eye Care  °· Check your eye every day for signs of infection. Watch for: °¨ Redness, swelling, or pain. °¨ Fluid, blood, or pus. °¨ Warmth. °¨ Bad smell. °Activity  °· Avoid strenuous activities, such as playing contact sports, for as long as told by your health care provider. °· Do not drive or operate heavy machinery until your health care provider approves. °· Do not bend or lift heavy objects . Bending increases pressure in the eye. You can walk, climb stairs, and do light household chores. °· Ask your health care provider when you can return to work. If you work in a dusty environment, you may be advised to wear protective eyewear for a period of time. °General instructions  °· Take or apply over-the-counter and prescription medicines only as told by your health care provider. This includes eye drops. °· Do not touch or rub your eyes. °· If you were given a protective shield, wear it as told by your health care provider. If you were not given a protective shield, wear sunglasses as told by your health care provider to protect your eyes. °· Keep the area around your eye clean and dry. Avoid swimming or allowing water to hit you directly in the face while showering until told by your health care provider. Keep soap and shampoo out of your eyes. °· Do not put a contact lens  into the affected eye or eyes until your health care provider approves. °· Keep all follow-up visits as told by your health care provider. This is important. °Contact a health care provider if: ° °· You have increased bruising around your eye. °· You have pain that is not helped with medicine. °· You have a fever. °· You have redness, swelling, or pain in your eye. °· You have fluid, blood, or pus coming from your incision. °· Your vision gets worse. °Get help right away if: °· You have sudden vision loss. °This information is not intended to replace advice given to you by your health care provider. Make sure you discuss any questions you have with your health care provider. °Document Released: 04/22/2005 Document Revised: 02/11/2016 Document Reviewed: 08/13/2015 °Elsevier Interactive Patient Education © 2017 [employer] ° ° ° ° °General Anesthesia, Adult, Care After °These instructions provide you with information about caring for yourself after your procedure. Your health care provider may also give you more specific instructions. Your treatment has been planned according to current medical practices, but problems sometimes occur. Call your health care provider if you have any problems or questions after your procedure. °What can I expect after the procedure? °After the procedure, it is common to have: °· Vomiting. °· A sore throat. °· Mental slowness. °It is common to feel: °· Nauseous. °· Cold or shivery. °· Sleepy. °· Tired. °· Sore or achy, even in parts of your body where you did not have surgery. °Follow these instructions at   home: °For at least 24 hours after the procedure:  °· Do not: °¨ Participate in activities where you could fall or become injured. °¨ Drive. °¨ Use heavy machinery. °¨ Drink alcohol. °¨ Take sleeping pills or medicines that cause drowsiness. °¨ Make important decisions or sign legal documents. °¨ Take care of children on your own. °· Rest. °Eating and drinking  °· If you vomit, drink  water, juice, or soup when you can drink without vomiting. °· Drink enough fluid to keep your urine clear or pale yellow. °· Make sure you have little or no nausea before eating solid foods. °· Follow the diet recommended by your health care provider. °General instructions  °· Have a responsible adult stay with you until you are awake and alert. °· Return to your normal activities as told by your health care provider. Ask your health care provider what activities are safe for you. °· Take over-the-counter and prescription medicines only as told by your health care provider. °· If you smoke, do not smoke without supervision. °· Keep all follow-up visits as told by your health care provider. This is important. °Contact a health care provider if: °· You continue to have nausea or vomiting at home, and medicines are not helpful. °· You cannot drink fluids or start eating again. °· You cannot urinate after 8-12 hours. °· You develop a skin rash. °· You have fever. °· You have increasing redness at the site of your procedure. °Get help right away if: °· You have difficulty breathing. °· You have chest pain. °· You have unexpected bleeding. °· You feel that you are having a life-threatening or urgent problem. °This information is not intended to replace advice given to you by your health care provider. Make sure you discuss any questions you have with your health care provider. °Document Released: 01/09/2001 Document Revised: 03/07/2016 Document Reviewed: 09/17/2015 °Elsevier Interactive Patient Education © 2017 [employer] Inc[employer] ° °

## 2016-09-07 ENCOUNTER — Encounter
Admission: RE | Disposition: A | Discharge status: Home / Self Care | Payer: Self-pay | Source: Ambulatory Visit | Attending: Ophthalmology

## 2016-09-07 ENCOUNTER — Ambulatory Visit: Payer: Medicare Other | Admitting: Anesthesiology

## 2016-09-07 ENCOUNTER — Ambulatory Visit
Admission: RE | Admit: 2016-09-07 | Discharge: 2016-09-07 | Disposition: A | Discharge status: Home / Self Care | Payer: Medicare Other | Source: Ambulatory Visit | Attending: Ophthalmology | Admitting: Ophthalmology

## 2016-09-07 ENCOUNTER — Encounter: Payer: Self-pay | Admitting: Ophthalmology

## 2016-09-07 DIAGNOSIS — I10 Essential (primary) hypertension: Secondary | ICD-10-CM | POA: Diagnosis not present

## 2016-09-07 DIAGNOSIS — H2512 Age-related nuclear cataract, left eye: Secondary | ICD-10-CM | POA: Diagnosis not present

## 2016-09-07 HISTORY — PX: CATARACT EXTRACTION W/PHACO: SHX586

## 2016-09-07 SURGERY — PHACOEMULSIFICATION, CATARACT, WITH IOL INSERTION
Anesthesia: Monitor Anesthesia Care | Laterality: Left | Wound class: Clean

## 2016-09-07 MED ORDER — NA HYALUR & NA CHOND-NA HYALUR 0.4-0.35 ML IO KIT
PACK | INTRAOCULAR | Status: DC | PRN
  Administered 2016-09-07: 1 mL via INTRAOCULAR

## 2016-09-07 MED ORDER — MOXIFLOXACIN HCL 0.5 % OP SOLN
1.0000 [drp] | OPHTHALMIC | Status: DC | PRN
  Administered 2016-09-07 (×3): 1 [drp] via OPHTHALMIC

## 2016-09-07 MED ORDER — ARMC OPHTHALMIC DILATING DROPS: 1.0000 "application " | OPHTHALMIC | Status: DC | PRN

## 2016-09-07 MED ORDER — MOXIFLOXACIN HCL 0.5 % OP SOLN: 1.0000 [drp] | OPHTHALMIC | Status: DC | PRN

## 2016-09-07 MED ORDER — MIDAZOLAM HCL 2 MG/2ML IJ SOLN
INTRAMUSCULAR | Status: DC | PRN
  Administered 2016-09-07: 2 mg via INTRAVENOUS

## 2016-09-07 MED ORDER — FENTANYL CITRATE (PF) 100 MCG/2ML IJ SOLN
INTRAMUSCULAR | Status: DC | PRN
  Administered 2016-09-07: 100 ug via INTRAVENOUS

## 2016-09-07 MED ORDER — LIDOCAINE HCL (PF) 4 % IJ SOLN
INTRAOCULAR | Status: DC | PRN
  Administered 2016-09-07: 1 mL via OPHTHALMIC

## 2016-09-07 MED ORDER — TIMOLOL MALEATE 0.5 % OP SOLN
OPHTHALMIC | Status: DC | PRN
  Administered 2016-09-07: 1 [drp] via OPHTHALMIC

## 2016-09-07 MED ORDER — LACTATED RINGERS IV SOLN: INTRAVENOUS | Status: DC

## 2016-09-07 MED ORDER — BRIMONIDINE TARTRATE 0.2 % OP SOLN
OPHTHALMIC | Status: DC | PRN
  Administered 2016-09-07: 1 [drp] via OPHTHALMIC

## 2016-09-07 MED ORDER — EPINEPHRINE PF 1 MG/ML IJ SOLN
INTRAOCULAR | Status: DC | PRN
  Administered 2016-09-07: 75 mL via OPHTHALMIC

## 2016-09-07 MED ORDER — ARMC OPHTHALMIC DILATING DROPS
1.0000 "application " | OPHTHALMIC | Status: DC | PRN
  Administered 2016-09-07 (×3): 1 via OPHTHALMIC

## 2016-09-07 MED ORDER — CEFUROXIME OPHTHALMIC INJECTION 1 MG/0.1 ML
INJECTION | OPHTHALMIC | Status: DC | PRN
  Administered 2016-09-07: 0.1 mL via OPHTHALMIC

## 2016-09-07 SURGICAL SUPPLY — 25 items
CANNULA ANT/CHMB 27GA (MISCELLANEOUS) ×3 IMPLANT
CARTRIDGE ABBOTT (MISCELLANEOUS) IMPLANT
GLOVE SURG LX 7.5 STRW (GLOVE) ×2
GLOVE SURG LX STRL 7.5 STRW (GLOVE) ×1 IMPLANT
GLOVE SURG TRIUMPH 8.0 PF LTX (GLOVE) ×3 IMPLANT
GOWN STRL REUS W/ TWL LRG LVL3 (GOWN DISPOSABLE) ×2 IMPLANT
GOWN STRL REUS W/TWL LRG LVL3 (GOWN DISPOSABLE) ×4
LENS IOL TECNIS ITEC 19.5 (Intraocular Lens) ×3 IMPLANT
MARKER SKIN DUAL TIP RULER LAB (MISCELLANEOUS) ×3 IMPLANT
NDL RETROBULBAR .5 NSTRL (NEEDLE) IMPLANT
NEEDLE FILTER BLUNT 18X 1/2SAF (NEEDLE) ×2
NEEDLE FILTER BLUNT 18X1 1/2 (NEEDLE) ×1 IMPLANT
PACK CATARACT BRASINGTON (MISCELLANEOUS) ×3 IMPLANT
PACK EYE AFTER SURG (MISCELLANEOUS) ×3 IMPLANT
PACK OPTHALMIC (MISCELLANEOUS) ×3 IMPLANT
RING MALYGIN 7.0 (MISCELLANEOUS) IMPLANT
SUT ETHILON 10-0 CS-B-6CS-B-6 (SUTURE)
SUT VICRYL  9 0 (SUTURE)
SUT VICRYL 9 0 (SUTURE) IMPLANT
SUTURE EHLN 10-0 CS-B-6CS-B-6 (SUTURE) IMPLANT
SYR 3ML LL SCALE MARK (SYRINGE) ×3 IMPLANT
SYR 5ML LL (SYRINGE) ×3 IMPLANT
SYR TB 1ML LUER SLIP (SYRINGE) ×3 IMPLANT
WATER STERILE IRR 250ML POUR (IV SOLUTION) ×3 IMPLANT
WIPE NON LINTING 3.25X3.25 (MISCELLANEOUS) ×3 IMPLANT

## 2016-09-07 NOTE — Anesthesia Preprocedure Evaluation (Signed)
Anesthesia Evaluation  Patient identified by MRN, date of birth, ID band Patient awake    Reviewed: Allergy & Precautions, H&P , NPO status , Patient's Chart, lab work & pertinent test results  Airway Mallampati: II  TM Distance: >3 FB Neck ROM: full    Dental  (+) Dental Advidsory Given, Chipped, Missing, Poor Dentition   Pulmonary    Pulmonary exam normal        Cardiovascular hypertension, Normal cardiovascular exam     Neuro/Psych PSYCHIATRIC DISORDERS    GI/Hepatic   Endo/Other    Renal/GU      Musculoskeletal   Abdominal   Peds  Hematology   Anesthesia Other Findings   Reproductive/Obstetrics                             Anesthesia Physical Anesthesia Plan  ASA: II  Anesthesia Plan: MAC   Post-op Pain Management:    Induction:   Airway Management Planned:   Additional Equipment:   Intra-op Plan:   Post-operative Plan:   Informed Consent: I have reviewed the patients History and Physical, chart, labs and discussed the procedure including the risks, benefits and alternatives for the proposed anesthesia with the patient or authorized representative who has indicated his/her understanding and acceptance.     Plan Discussed with:   Anesthesia Plan Comments:         Anesthesia Quick Evaluation

## 2016-09-07 NOTE — H&P (Signed)
The History and Physical notes are on paper, have been signed, and are to be scanned. The patient remains stable and unchanged from the H&P.   Previous H&P reviewed, patient examined, and there are no changes.  Danny Mcgrath 09/07/2016 8:13 AM

## 2016-09-07 NOTE — Anesthesia Procedure Notes (Deleted)
Performed by: Ladean Steinmeyer Pre-anesthesia Checklist: Patient identified, Emergency Drugs available, Suction available, Timeout performed and Patient being monitored Patient Re-evaluated:Patient Re-evaluated prior to inductionOxygen Delivery Method: Circle system utilized Preoxygenation: Pre-oxygenation with 100% oxygen Intubation Type: Inhalational induction Ventilation: Mask ventilation without difficulty and Mask ventilation throughout procedure Dental Injury: Teeth and Oropharynx as per pre-operative assessment        

## 2016-09-07 NOTE — Anesthesia Procedure Notes (Signed)
Procedure Name: MAC Performed by: Rochester Serpe Pre-anesthesia Checklist: Patient identified, Emergency Drugs available, Suction available, Timeout performed and Patient being monitored Patient Re-evaluated:Patient Re-evaluated prior to inductionOxygen Delivery Method: Nasal cannula Placement Confirmation: positive ETCO2     

## 2016-09-07 NOTE — Op Note (Signed)
OPERATIVE NOTE  Nat MathJoseph Lansky Jr. [id number] 09/07/2016   PREOPERATIVE DIAGNOSIS:  Nuclear sclerotic cataract left eye. H25.12   POSTOPERATIVE DIAGNOSIS:    Nuclear sclerotic cataract left eye.     PROCEDURE:  Phacoemusification with posterior chamber intraocular lens placement of the left eye   LENS:   Implant Name Type Inv. Item Serial No. Manufacturer Lot No. LRB No. Used  LENS IOL DIOP 19.5 - [id number] Intraocular Lens LENS IOL DIOP 19.5 [id number]   Left 1        ULTRASOUND TIME: 15  % of 1 minutes 19 seconds, CDE 11.9  SURGEON:  Deirdre Evenerhadwick R. Cesia Orf, MD   ANESTHESIA:  Topical with tetracaine drops and 2% Xylocaine jelly, augmented with 1% preservative-free intracameral lidocaine.    COMPLICATIONS:  None.   DESCRIPTION OF PROCEDURE:  The patient was identified in the holding room and transported to the operating room and placed in the supine position under the operating microscope.  The left eye was identified as the operative eye and it was prepped and draped in the usual sterile ophthalmic fashion.   A 1 millimeter clear-corneal paracentesis was made at the 1:30 position.  0.5 ml of preservative-free 1% lidocaine was injected into the anterior chamber.  The anterior chamber was filled with Viscoat viscoelastic.  A 2.4 millimeter keratome was used to make a near-clear corneal incision at the 10:30 position.  .  A curvilinear capsulorrhexis was made with a cystotome and capsulorrhexis forceps.  Balanced salt solution was used to hydrodissect and hydrodelineate the nucleus.   Phacoemulsification was then used in stop and chop fashion to remove the lens nucleus and epinucleus.  The remaining cortex was then removed using the irrigation and aspiration handpiece. Provisc was then placed into the capsular bag to distend it for lens placement.  A lens was then injected into the capsular bag.  The remaining viscoelastic was aspirated.   Wounds were hydrated with balanced  salt solution.  The anterior chamber was inflated to a physiologic pressure with balanced salt solution.  No wound leaks were noted. Cefuroxime 0.1 ml of a 10mg /ml solution was injected into the anterior chamber for a dose of 1 mg of intracameral antibiotic at the completion of the case.   Timolol and Brimonidine drops were applied to the eye.  The patient was taken to the recovery room in stable condition without complications of anesthesia or surgery.  Kentravious Lipford 09/07/2016, 9:00 AM

## 2016-09-07 NOTE — Anesthesia Postprocedure Evaluation (Signed)
Anesthesia Post Note  Patient: Danny MathJoseph Tafolla Jr.  Procedure(s) Performed: Procedure(s) (LRB): CATARACT EXTRACTION PHACO AND INTRAOCULAR LENS PLACEMENT (IOC) (Left)  Patient location during evaluation: PACU Anesthesia Type: MAC Level of consciousness: awake and alert and oriented Pain management: satisfactory to patient Vital Signs Assessment: post-procedure vital signs reviewed and stable Respiratory status: spontaneous breathing, nonlabored ventilation and respiratory function stable Cardiovascular status: blood pressure returned to baseline and stable Postop Assessment: Adequate PO intake and No signs of nausea or vomiting Anesthetic complications: no    Danny Mcgrath, Danny Mcgrath

## 2016-09-07 NOTE — Transfer of Care (Signed)
Immediate Anesthesia Transfer of Care Note  Patient: Danny MathJoseph Cappiello Jr.  Procedure(s) Performed: Procedure(s) with comments: CATARACT EXTRACTION PHACO AND INTRAOCULAR LENS PLACEMENT (IOC) (Left) - PT WOULD LIKE LATER APPT  Patient Location: PACU  Anesthesia Type: MAC  Level of Consciousness: awake, alert  and patient cooperative  Airway and Oxygen Therapy: Patient Spontanous Breathing and Patient connected to supplemental oxygen  Post-op Assessment: Post-op Vital signs reviewed, Patient's Cardiovascular Status Stable, Respiratory Function Stable, Patent Airway and No signs of Nausea or vomiting  Post-op Vital Signs: Reviewed and stable  Complications: No apparent anesthesia complications

## 2016-10-25 ENCOUNTER — Ambulatory Visit: Payer: 59 | Admitting: Psychiatry

## 2016-10-26 ENCOUNTER — Encounter: Payer: Self-pay | Admitting: Psychiatry

## 2016-10-26 ENCOUNTER — Ambulatory Visit (INDEPENDENT_AMBULATORY_CARE_PROVIDER_SITE_OTHER): Payer: 59 | Admitting: Psychiatry

## 2016-10-26 VITALS — BP 168/76 | HR 36 | Wt 209.2 lb

## 2016-10-26 DIAGNOSIS — F203 Undifferentiated schizophrenia: Secondary | ICD-10-CM

## 2016-10-26 DIAGNOSIS — R4689 Other symptoms and signs involving appearance and behavior: Secondary | ICD-10-CM | POA: Diagnosis not present

## 2016-10-26 DIAGNOSIS — R4189 Other symptoms and signs involving cognitive functions and awareness: Secondary | ICD-10-CM | POA: Diagnosis not present

## 2016-10-26 MED ORDER — ESCITALOPRAM OXALATE 5 MG PO TABS: 5.0000 mg | ORAL_TABLET | Freq: Every day | ORAL | 3 refills | Status: DC

## 2016-10-26 MED ORDER — RIVASTIGMINE TARTRATE 6 MG PO CAPS: 6.0000 mg | ORAL_CAPSULE | Freq: Two times a day (BID) | ORAL | 1 refills | Status: DC

## 2016-10-26 MED ORDER — QUETIAPINE FUMARATE 50 MG PO TABS: 50.0000 mg | ORAL_TABLET | Freq: Every day | ORAL | 1 refills | Status: DC

## 2016-10-26 NOTE — Progress Notes (Signed)
Psychiatric MD Progress Note  Patient Identification: Danny Mcgrath. MRN:  [id number] Date of Evaluation:  10/26/2016 Referral Source: Dr Adriana SimasErlene Quan.  Chief Complaint:   Chief Complaint    Follow-up; Medication Refill     Visit Diagnosis:    ICD-9-CM ICD-10-CM   1. 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 Cognitive and behavioral changes 799.59 R41.89    312.9 R46.89     History of Present Illness:   Pt is  71 year old divorced white male with history of schizophrenia presented for the follow-up appointment. He reported that he is Doing well. He reported that he has been compliant with his medications. He stated that he has been spending time at home. He reported that he has electric heaters and he is keeping himself warm. He reported that he has several layers of winter  clothing. He stated that he enjoys his holidays reading books. He appeared calm and alert during the interview. He reported that his memory is getting better. He was pleasant and cooperative during the interview.  Pt  currently denied having any suicidal ideations or plans. He denied having any perceptual disturbances.    Associated Signs/Symptoms: Depression Symptoms:  depressed mood, difficulty concentrating, anxiety, (Hypo) Manic Symptoms:  none Anxiety Symptoms:  none Psychotic Symptoms:  Ideas of Reference, Paranoia, PTSD Symptoms: Negative NA  Past Psychiatric History:  H/o Psychiatric admission in [place] Has seen a Psychiatrist in [place]- Does not remember the name.   Previous Psychotropic Medications: Yes  Lithium Zoloft  Substance Abuse History in the last 12 months:  No.  Consequences of Substance Abuse: Negative NA  Past Medical History:  Past Medical History:  Diagnosis Date  . Bipolar disorder (HCC)   . Chicken pox   . Glaucoma   . Heart murmur   . History of blood transfusion   . Hypertension     Past Surgical History:  Procedure Laterality Date   . ADENOIDECTOMY    . CATARACT EXTRACTION W/PHACO Right 01/13/2016   Procedure: CATARACT EXTRACTION PHACO AND INTRAOCULAR LENS PLACEMENT (IOC);  Surgeon: Lockie Mola, MD;  Location: [facility];  Service: Ophthalmology;  Laterality: Right;  . CATARACT EXTRACTION W/PHACO Left 09/07/2016   Procedure: CATARACT EXTRACTION PHACO AND INTRAOCULAR LENS PLACEMENT (IOC);  Surgeon: Lockie Mola, MD;  Location: [facility];  Service: Ophthalmology;  Laterality: Left;  PT WOULD LIKE LATER APPT  . CHOLECYSTECTOMY    . EYE SURGERY    . PENILE PROSTHESIS IMPLANT    . 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  Family History:  Family History  Problem Relation Age of Onset  . Arthritis Mother   . Cancer Mother     lung   . Hypertension Mother   . Anxiety disorder Mother   . Depression Mother   . Arthritis Father   . Cancer Father     lung and prostate  . Hypertension Father   . Diabetes Father   . Diabetes Sister   . Leukemia Sister     Social History:   Social History   Social History  . Marital status: Single    Spouse name: N/A  . Number of children: N/A  . Years of education: N/A   Social History Main Topics  . Smoking status: Never Smoker  . Smokeless tobacco: Never Used  .  Alcohol use No  . Drug use: No  . Sexual activity: Not Currently   Other Topics Concern  . None   Social History Narrative  . None    Additional Social History:  Patient was married 3 times. His first marriage was in [id number]. He reported that she passed away. Second time he got married in 1989 and it lasted  lasted for 5 years. His third marriage was in 2009  Allergies:  No Known Allergies  Metabolic Disorder Labs: Lab Results  Component Value Date   HGBA1C 5.5 07/31/2015   No results found for: PROLACTIN Lab  Results  Component Value Date   CHOL 116 05/18/2016   TRIG 119.0 05/18/2016   HDL 40.00 05/18/2016   CHOLHDL 3 05/18/2016   VLDL 23.8 05/18/2016   LDLCALC 52 05/18/2016   LDLCALC 115 (H) 07/31/2015     Current Medications: Current Outpatient Prescriptions  Medication Sig Dispense Refill  . amLODipine (NORVASC) 5 MG tablet TAKE 1 TABLET DAILY 90 tablet 3  . aspirin EC 81 MG tablet Take 1 tablet (81 mg total) by mouth daily. 90 tablet 3  . atorvastatin (LIPITOR) 40 MG tablet TAKE 1 TABLET DAILY 90 tablet 3  . escitalopram (LEXAPRO) 5 MG tablet Take 1 tablet (5 mg total) by mouth daily. 90 tablet 3  . QUEtiapine (SEROQUEL) 50 MG tablet Take 1 tablet (50 mg total) by mouth at bedtime. 90 tablet 1  . rivastigmine (EXELON) 6 MG capsule Take 1 capsule (6 mg total) by mouth 2 (two) times daily. 180 capsule 1   No current facility-administered medications for this visit.     Neurologic: Headache: No Seizure: No Paresthesias:No  Musculoskeletal: Strength & Muscle Tone: within normal limits Gait & Station: normal Patient leans: N/A  Psychiatric Specialty Exam: ROS  Blood pressure (!) 168/76, pulse (!) 36, weight 209 lb 3.2 oz (94.9 kg).Body mass index is 30.89 kg/m.  General Appearance: Bad Body Odor  Eye Contact:  Fair  Speech:  Clear and Coherent  Volume:  Normal  Mood:  Anxious and Depressed  Affect:  Congruent  Thought Process:  Coherent  Orientation:  Full (Time, Place, and Person)  Thought Content:  WDL  Suicidal Thoughts:  No  Homicidal Thoughts:  No  Memory:  Immediate;   Fair  Judgement:  Good  Insight:  Fair  Psychomotor Activity:  Normal  Concentration:  Fair  Recall:  Poor  Fund of Knowledge:Fair  Language: Fair  Akathisia:  No  Handed:  Right  AIMS (if indicated):    Assets:  Community education officerCommunication Skills Housing Transportation  ADL's:  Intact  Cognition: Impaired,  Mild  Sleep:  fair    Treatment Plan Summary: Medication management   Discussed with  patient about the medications treatment risks benefits and alternatives. He reported that he is compliant with her medications and will take them as prescribed.  Continue Seroquel 50  mg by mouth daily at bedtime for insomnia and hallucinations.  Continue  Exelon 6 mg by mouth twice a day for his memory issues Continue Lexapro 5 mg daily  Follow-up in 2 months or earlier depending on his symptoms.  Discussed with him about side effects of the medication in detail and he demonstrated understanding   More than 50% of the time spent in psychoeducation, counseling and coordination of care.       This note was generated in part or whole with voice recognition software. Voice regonition is usually quite accurate but there are transcription errors  that can and very often do occur. I apologize for any typographical errors that were not detected and corrected.    Brandy Hale, MD 1/10/20182:57 PM

## 2016-11-18 ENCOUNTER — Encounter: Payer: Self-pay | Admitting: Family Medicine

## 2016-11-18 ENCOUNTER — Ambulatory Visit (INDEPENDENT_AMBULATORY_CARE_PROVIDER_SITE_OTHER): Payer: Medicare Other | Admitting: Family Medicine

## 2016-11-18 VITALS — BP 157/91 | HR 64 | Temp 98.6°F | Wt 204.0 lb

## 2016-11-18 DIAGNOSIS — N182 Chronic kidney disease, stage 2 (mild): Secondary | ICD-10-CM | POA: Insufficient documentation

## 2016-11-18 DIAGNOSIS — E785 Hyperlipidemia, unspecified: Secondary | ICD-10-CM

## 2016-11-18 DIAGNOSIS — Z23 Encounter for immunization: Secondary | ICD-10-CM

## 2016-11-18 DIAGNOSIS — F203 Undifferentiated schizophrenia: Secondary | ICD-10-CM

## 2016-11-18 DIAGNOSIS — I10 Essential (primary) hypertension: Secondary | ICD-10-CM | POA: Diagnosis not present

## 2016-11-18 DIAGNOSIS — Z0000 Encounter for general adult medical examination without abnormal findings: Secondary | ICD-10-CM

## 2016-11-18 MED ORDER — AMLODIPINE BESYLATE 10 MG PO TABS: 10.0000 mg | ORAL_TABLET | Freq: Every day | ORAL | 3 refills | Status: DC

## 2016-11-18 NOTE — Patient Instructions (Addendum)
Your hand looks well.  I have increased her amlodipine. Please follow-up in one month for blood pressure check.  Continue your other medications  Take care  Dr. Adriana Simasook    These are the goals we discussed: Goals    . Increase physical activity          Add an additional day out of the week when walking for exercise, 30-45 minutes.       This is a list of the screening recommended for you and due dates:  Health Maintenance  Topic Date Due  . Cologuard (Stool DNA test)  06/13/1994  . Pneumonia vaccines (2 of 2 - PPSV23) 11/18/2016  . Tetanus Vaccine  11/18/2025  . Flu Shot  Addressed  . Shingles Vaccine  Addressed  .  Hepatitis C: One time screening is recommended by Center for Disease Control  (CDC) for  adults born from [birth year] through 1967.   Completed

## 2016-11-18 NOTE — Progress Notes (Signed)
Care was provided under my supervision. I agree with the management as indicated in the note.  Hartlee Amedee DO  

## 2016-11-18 NOTE — Progress Notes (Signed)
Subjective:   Danny Mcgrath. is a 71 y.o. male who presents for an Initial Medicare Annual Wellness Visit.  Review of Systems  No ROS.  Medicare Wellness Visit.  Cardiac Risk Factors include: advanced age (>43men, >46 women);male gender;hypertension;obesity (BMI >30kg/m2)    Objective:    Today's Vitals   11/18/16 1323  BP: (!) 157/91  Pulse: 64  Temp: 98.6 F (37 C)  TempSrc: Oral  SpO2: 96%  Weight: 204 lb (92.5 kg)   Body mass index is 30.13 kg/m.  Current Medications (verified) Outpatient Encounter Prescriptions as of 11/18/2016  Medication Sig  . amLODipine (NORVASC) 10 MG tablet Take 1 tablet (10 mg total) by mouth daily.  Marland Kitchen aspirin EC 81 MG tablet Take 1 tablet (81 mg total) by mouth daily.  Marland Kitchen atorvastatin (LIPITOR) 40 MG tablet TAKE 1 TABLET DAILY  . escitalopram (LEXAPRO) 5 MG tablet Take 1 tablet (5 mg total) by mouth daily.  . QUEtiapine (SEROQUEL) 50 MG tablet Take 1 tablet (50 mg total) by mouth at bedtime.  . rivastigmine (EXELON) 6 MG capsule Take 1 capsule (6 mg total) by mouth 2 (two) times daily.  . [DISCONTINUED] amLODipine (NORVASC) 5 MG tablet TAKE 1 TABLET DAILY   No facility-administered encounter medications on file as of 11/18/2016.     Allergies (verified) Patient has no known allergies.   History: Past Medical History:  Diagnosis Date  . Bipolar disorder (HCC)   . Chicken pox   . Glaucoma   . Heart murmur   . History of blood transfusion   . Hypertension    Past Surgical History:  Procedure Laterality Date  . ADENOIDECTOMY    . CATARACT EXTRACTION W/PHACO Right 01/13/2016   Procedure: CATARACT EXTRACTION PHACO AND INTRAOCULAR LENS PLACEMENT (IOC);  Surgeon: Lockie Mola, MD;  Location: [facility];  Service: Ophthalmology;  Laterality: Right;  . CATARACT EXTRACTION W/PHACO Left 09/07/2016   Procedure: CATARACT EXTRACTION PHACO AND INTRAOCULAR LENS PLACEMENT (IOC);  Surgeon: Lockie Mola, MD;  Location:  [facility];  Service: Ophthalmology;  Laterality: Left;  PT WOULD LIKE LATER APPT  . CHOLECYSTECTOMY    . EYE SURGERY    . PENILE PROSTHESIS IMPLANT    . 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 Arthritis Mother   . Cancer Mother     lung   . Hypertension Mother   . Anxiety disorder Mother   . Depression Mother   . Arthritis Father   . Cancer Father     lung and prostate  . Hypertension Father   . Diabetes Father   . Diabetes Sister   . Leukemia Sister    Social History   Occupational History  . Not on file.   Social History Main Topics  . Smoking status: Never Smoker  . Smokeless tobacco: Never Used  . Alcohol use No  . Drug use: No  . Sexual activity: Not Currently   Tobacco Counseling Counseling given: Not Answered   Activities of Daily Living In your present state of health, do you have any difficulty performing the following activities: 11/18/2016 09/07/2016  Hearing? N N  Vision? N N  Difficulty concentrating or making decisions? Y N  Walking or climbing stairs? N N  Dressing or bathing? N N  Doing errands, shopping? N -  Preparing Food and eating ? N -  Using the Toilet?  N -  In the past six months, have you accidently leaked urine? N -  Do you have problems with loss of bowel control? N -  Managing your Medications? N -  Managing your Finances? N -  Housekeeping or managing your Housekeeping? N -  Some recent data might be hidden    Immunizations and Health Maintenance Immunization History  Administered Date(s) Administered  . Influenza, High Dose Seasonal PF 11/18/2016  . Influenza,inj,Quad PF,36+ Mos 07/23/2015  . Pneumococcal Conjugate-13 11/19/2015  . Tdap 11/19/2015   Health Maintenance Due  Topic Date Due  . Fecal DNA (Cologuard)  06/13/1994  . PNA vac Low Risk Adult (2 of 2 - PPSV23) 11/18/2016    Patient Care Team: Tommie Sams,  DO as PCP - General (Family Medicine)  Indicate any recent Medical Services you may have received from other than [facility] providers in the past year (date may be approximate).    Assessment:   This is a routine wellness examination for Danny Mcgrath. The goal of the wellness visit is to assist the patient how to close the gaps in care and create a preventative care plan for the patient.   Osteoporosis risk reviewed.  Medications reviewed; taking without issues or barriers.  Safety issues reviewed; smoke detectors in the home. No firearms in the home. Wears seatbelts when driving or riding with others. No violence in the home.  No identified risk were noted; The patient was oriented x 3; appropriate in dress and manner and no objective failures at ADL's or IADL's.   BMI; discussed the importance of a healthy diet, water intake and exercise. Educational material provided.  HTN; followed by PCP.  Patient Concerns: None at this time. Follow up with PCP as needed.  Hearing/Vision screen Hearing Screening Comments: Patient passes the whisper test Vision Screening Comments: Followed by [facility] (Dr. Inez Pilgrim) Cataracts extracted, bilateral Wears glasses Visual acuity not assessed per patient preference since he has regular follow up with his ophthalmologist  Dietary issues and exercise activities discussed: Current Exercise Habits: Home exercise routine (Walks his dog for exercise), Type of exercise: walking, Time (Minutes): 45, Frequency (Times/Week): 2, Weekly Exercise (Minutes/Week): 90, Intensity: Moderate  Goals    . Increase physical activity          Add an additional day out of the week when walking for exercise, 30-45 minutes.      Depression Screen PHQ 2/9 Scores 11/18/2016  PHQ - 2 Score 0    Fall Risk Fall Risk  11/18/2016  Falls in the past year? Yes  Number falls in past yr: 2 or more  Injury with Fall? No    Cognitive Function: MMSE - Mini Mental  State Exam 11/18/2016  Orientation to time 5  Orientation to Place 5  Registration 3  Attention/ Calculation 5  Recall 3  Language- name 2 objects 2  Language- repeat 1  Language- follow 3 step command 3  Language- read & follow direction 1  Write a sentence 1  Copy design 1  Total score 30        Screening Tests Health Maintenance  Topic Date Due  . Fecal DNA (Cologuard)  06/13/1994  . PNA vac Low Risk Adult (2 of 2 - PPSV23) 11/18/2016  . TETANUS/TDAP  11/18/2025  . INFLUENZA VACCINE  Addressed  . ZOSTAVAX  Addressed  . Hepatitis C Screening  Completed        Plan:    End of life planning; Advance aging;  Advanced directives discussed. No HCPOA/Living Will.  Additional information declined at this time.    Medicare Attestation I have personally reviewed: The patient's medical and social history Their use of alcohol, tobacco or illicit drugs Their current medications and supplements The patient's functional ability including ADLs,fall risks, home safety risks, cognitive, and hearing and visual impairment Diet and physical activities Evidence for depression   The patient's weight, height, BMI, and visual acuity have been recorded in the chart.  I have made referrals and provided education to the patient based on review of the above and I have provided the patient with a written personalized care plan for preventive services.    During the course of the visit Jomarie LongsJoseph was educated and counseled about the following appropriate screening and preventive services:   Vaccines to include Pneumoccal, Influenza, Hepatitis B, Td, Zostavax, HCV  Electrocardiogram  Colorectal cancer screening  Cardiovascular disease screening  Diabetes screening  Glaucoma screening  Nutrition counseling  Prostate cancer screening  Smoking cessation counseling  Patient Instructions (the written plan) were given to the patient.   Ashok PallOBrien-Blaney, Sharonne Ricketts L, LPN   9/6/04542/11/2016

## 2016-11-18 NOTE — Assessment & Plan Note (Signed)
Stable. Continue lipitor. 

## 2016-11-18 NOTE — Addendum Note (Signed)
Addended by: Varney Biles'BRIEN-BLANEY, DENISA L on: 11/18/2016 02:35 PM   Modules accepted: Level of Service, SmartSet

## 2016-11-18 NOTE — Progress Notes (Signed)
Subjective:  Patient ID: Danny MathJoseph Profeta Jr., male    DOB: 10-04-1946  Age: 71 y.o. MRN: [id number]  CC: 6 month follow up  HPI:  71 year old male with schizophrenia, hypertension, hyperlipidemia presents for follow-up.  Schizophrenia  Follows with psychiatry.  Has been stable and compliant with his medication.  He is currently on Seroquel, Lexapro, and Exelon.  Hypertension  BP elevated today.  Patient endorses compliance with amlodipine 5 mg daily.  Will discuss today.   Hyperlipidemia  Has been stable.  Patient has significant improvement following treatment with Lipitor.  Endorses compliance.  No side effects.   Social Hx   Social History   Social History  . Marital status: Single    Spouse name: N/A  . Number of children: N/A  . Years of education: N/A   Social History Main Topics  . Smoking status: Never Smoker  . Smokeless tobacco: Never Used  . Alcohol use No  . Drug use: No  . Sexual activity: Not Currently   Other Topics Concern  . None   Social History Narrative  . None    Review of Systems  Constitutional: Negative.   Musculoskeletal:       ? Recent hand injury.   Objective:  BP (!) 157/91   Pulse 64   Temp 98.6 F (37 C) (Oral)   Wt 204 lb (92.5 kg)   SpO2 96%   BMI 30.13 kg/m   BP/Weight 11/18/2016 10/26/2016 09/07/2016  Systolic BP 157 168 117  Diastolic BP 91 76 72  Wt. (Lbs) 204 209.2 -  BMI 30.13 30.89 30.27   Physical Exam  Constitutional: He appears well-developed. No distress.  Cardiovascular: Normal rate and regular rhythm.   2/6 systolic murmur.  Pulmonary/Chest: Effort normal and breath sounds normal.  Neurological: He is alert.  Psychiatric:  Flat affect. In good spirits.  Vitals reviewed.  Lab Results  Component Value Date   WBC 7.0 07/31/2015   HGB 13.7 07/31/2015   HCT 42.8 07/31/2015   PLT 182.0 07/31/2015   GLUCOSE 94 05/18/2016   CHOL 116 05/18/2016   TRIG 119.0 05/18/2016   HDL 40.00  05/18/2016   LDLCALC 52 05/18/2016   ALT 31 05/18/2016   AST 30 05/18/2016   NA 140 05/18/2016   K 4.0 05/18/2016   CL 106 05/18/2016   CREATININE 1.24 05/18/2016   BUN 16 05/18/2016   CO2 27 05/18/2016   HGBA1C 5.5 07/31/2015    Assessment & Plan:   Problem List Items Addressed This Visit    HTN (hypertension)    Established problem, uncontrolled this time. BP elevated today. Increasing Norvasc to 10 mg daily. BP check in 1 month.      Relevant Medications   amLODipine (NORVASC) 10 MG tablet   Hyperlipidemia    Stable. Continue lipitor.      Relevant Medications   amLODipine (NORVASC) 10 MG tablet   Undifferentiated schizophrenia (HCC) - Primary    Stable. Continue to follow closely with psychiatry. Continue current medications: Seroquel, Exelon, Lexapro.       Other Visit Diagnoses    Encounter for immunization       Relevant Orders   Flu vaccine HIGH DOSE PF (Completed)     Meds ordered this encounter  Medications  . amLODipine (NORVASC) 10 MG tablet    Sig: Take 1 tablet (10 mg total) by mouth daily.    Dispense:  90 tablet    Refill:  3   Follow-up: Return in  about 1 month (around 12/16/2016) for BP check - Nurse visit.  Everlene Other DO [facility]

## 2016-11-18 NOTE — Assessment & Plan Note (Signed)
Established problem, uncontrolled this time. BP elevated today. Increasing Norvasc to 10 mg daily. BP check in 1 month.

## 2016-11-18 NOTE — Assessment & Plan Note (Signed)
Stable. Continue to follow closely with psychiatry. Continue current medications: Seroquel, Exelon, Lexapro.

## 2016-11-21 ENCOUNTER — Emergency Department
Admission: EM | Admit: 2016-11-21 | Discharge: 2016-11-21 | Disposition: A | Discharge status: Home / Self Care | Payer: Medicare Other | Attending: Emergency Medicine | Admitting: Emergency Medicine

## 2016-11-21 DIAGNOSIS — R040 Epistaxis: Secondary | ICD-10-CM | POA: Insufficient documentation

## 2016-11-21 DIAGNOSIS — Z7982 Long term (current) use of aspirin: Secondary | ICD-10-CM | POA: Insufficient documentation

## 2016-11-21 DIAGNOSIS — N182 Chronic kidney disease, stage 2 (mild): Secondary | ICD-10-CM | POA: Insufficient documentation

## 2016-11-21 DIAGNOSIS — Z79899 Other long term (current) drug therapy: Secondary | ICD-10-CM | POA: Diagnosis not present

## 2016-11-21 DIAGNOSIS — I129 Hypertensive chronic kidney disease with stage 1 through stage 4 chronic kidney disease, or unspecified chronic kidney disease: Secondary | ICD-10-CM | POA: Insufficient documentation

## 2016-11-21 NOTE — ED Triage Notes (Signed)
Pt started with nose bleed at 530pm that he is unable to stop

## 2016-11-21 NOTE — ED Notes (Signed)
Pt alert and oriented X4, active, cooperative, pt in NAD. RR even and unlabored, color WNL.  Pt informed to return if any life threatening symptoms occur.   

## 2016-11-21 NOTE — ED Provider Notes (Signed)
[facility] Emergency Department Provider Note   ____________________________________________   First MD Initiated Contact with Patient 11/21/16 1926     (approximate)  I have reviewed the triage vital signs and the nursing notes.   HISTORY  Chief Complaint Epistaxis    HPI Danny MathJoseph Knowles Jr. is a 71 y.o. male reports that couple hours before coming emergency department he started having bleeding out of his left nostril. He was sitting comfortably reading a book when it began to bleed from the front of his left nose. He reports he doesn't have any history of trouble bleeding from the nostril in the past. There was no trauma to it. He is not taking and it  No fevers or chills. He attempted to put gauze in it and hold pressure for about 20 minutes, but he continued to bleed through the gauze.  At the present time he reports after having nasal clip placed upon it that he doesn't feel any further bleeding, he is not having any blood running down back in his throat. He describes as a moderate amount of blood. Not associated lightheadedness. He denies taking any blood thinners.  Headache, no nausea or vomiting  Past Medical History:  Diagnosis Date  . Bipolar disorder (HCC)   . Chicken pox   . Glaucoma   . Heart murmur   . History of blood transfusion   . Hypertension     Patient Active Problem List   Diagnosis Date Noted  . CKD (chronic kidney disease) stage 2, GFR 60-89 ml/min 11/18/2016  . Undifferentiated schizophrenia (HCC) 05/18/2016  . Hyperlipidemia 11/19/2015  . Glaucoma 07/24/2015  . Heart murmur, systolic 07/24/2015  . HTN (hypertension) 07/24/2015  . Preventative health care 07/24/2015    Past Surgical History:  Procedure Laterality Date  . ADENOIDECTOMY    . CATARACT EXTRACTION W/PHACO Right 01/13/2016   Procedure: CATARACT EXTRACTION PHACO AND INTRAOCULAR LENS PLACEMENT (IOC);  Surgeon: Lockie Molahadwick Brasington, MD;  [facility];  Service: Ophthalmology;  Laterality: Right;  . CATARACT EXTRACTION W/PHACO Left 09/07/2016   Procedure: CATARACT EXTRACTION PHACO AND INTRAOCULAR LENS PLACEMENT (IOC);  Surgeon: Lockie Molahadwick Brasington, MD;  [facility];  Service: Ophthalmology;  Laterality: Left;  PT WOULD LIKE LATER APPT  . CHOLECYSTECTOMY    . EYE SURGERY    . PENILE PROSTHESIS IMPLANT    . 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 Authorizing Provider  amLODipine (NORVASC) 10 MG tablet Take 1 tablet (10 mg total) by mouth daily. 11/18/16   [name], DO  aspirin EC 81 MG tablet Take 1 tablet (81 mg total) by mouth daily. 07/23/15   [name], DO  atorvastatin (LIPITOR) 40 MG tablet TAKE 1 TABLET DAILY 07/20/16   [name], DO  escitalopram (LEXAPRO) 5 MG tablet Take 1 tablet (5 mg total) by mouth daily. 10/26/16   [name], MD  QUEtiapine (SEROQUEL) 50 MG tablet Take 1 tablet (50 mg total) by mouth at bedtime. 10/26/16   [name], MD  rivastigmine (EXELON) 6 MG capsule Take 1 capsule (6 mg total) by mouth 2 (two) times daily. 10/26/16   [name], MD    Allergies Patient has no known allergies.  Family History  Problem Relation Age of Onset  . Arthritis Mother   . Cancer Mother  lung   . Hypertension Mother   . Anxiety disorder Mother   . Depression Mother   . Arthritis Father   . Cancer Father     lung and prostate  . Hypertension Father   . Diabetes Father   . Diabetes Sister   . Leukemia Sister     Social History Social History  Substance Use Topics  . Smoking status: Never Smoker  . Smokeless tobacco: Never Used  . Alcohol use No    Review of Systems Constitutional: No fever/chills Eyes: No visual changes. ENT: No sore throat.See history of present illness Cardiovascular: Denies chest pain. Respiratory: Denies shortness of  breath. Gastrointestinal:  No nausea, no vomiting.   Neurological: Negative for headaches.   ____________________________________________   PHYSICAL EXAM:  VITAL SIGNS: ED Triage Vitals  Enc Vitals Group     BP 11/21/16 1853 (!) 131/93     Pulse Rate 11/21/16 1853 84     Resp 11/21/16 1853 16     Temp 11/21/16 1853 98.6 F (37 C)     Temp Source 11/21/16 1853 Oral     SpO2 11/21/16 1853 96 %     Weight 11/21/16 1852 200 lb (90.7 kg)     Height 11/21/16 1852 5\' 10"  (1.778 m)     Head Circumference --      Peak Flow --      Pain Score 11/21/16 1852 0     Pain Loc --      Pain Edu? --      Excl. in GC? --     Constitutional: Alert and oriented. Well appearing and in no acute distress. Eyes: Conjunctivae are normal. PERRL. EOMI. Head: Atraumatic. Nose: Patient with no evidence of ongoing active bleeding. The gauze place is left there is removed along with the clip. He blew out his nose, there is no evidence of ongoing bleeding. His exam and carefully, no evidence of bleeding in the back of the throat, patient reports she no longer feels that there is any bleeding going on, and there is a small amount of clot noted in the left anterior naris in the region of Hesselbach's plexus appears consistent with an anterior nosebleed that has now stopped bleeding. Mouth/Throat: Mucous membranes are moist.  Oropharynx non-erythematous. Neck: No stridor.   Cardiovascular: Normal rate, regular rhythm. Grossly normal heart sounds.  Good peripheral circulation. Respiratory: Normal respiratory effort.  No retractions. Lungs CTAB. Neurologic:  Normal speech and language. No gross focal neurologic deficits are appreciated.  Skin:  Skin is warm, dry and intact. No rash noted. Psychiatric: Mood and affect are normal. Speech and behavior are normal.  ____________________________________________   LABS (all labs ordered are listed, but only abnormal results are displayed)  Labs Reviewed - No data  to display ____________________________________________  EKG   ____________________________________________  RADIOLOGY    ____________________________________________   PROCEDURES  Procedure(s) performed: None  Procedures  Critical Care performed: No  ____________________________________________   INITIAL IMPRESSION / ASSESSMENT AND PLAN / ED COURSE  Pertinent labs & imaging results that were available during my care of the patient were reviewed by me and considered in my medical decision making (see chart for details).  Patient seen for epistaxis. Now controlled. Evidence of left anterior epistaxis, now resolved. Observe the patient for about 30 minutes after removing nasal clips and packing that he placed. There is no longer any evidence of ongoing bleeding. Discussed with the patient, careful return precautions, and follow-up recommendations. Patient will follow-up with  his doctor if he has any further issues, or return to the emergency room if he has any ongoing bleeding that won't improve after simple nasal pressure, increased bleeding, heavy bleeding, or other new concerns arise      ____________________________________________   FINAL CLINICAL IMPRESSION(S) / ED DIAGNOSES  Final diagnoses:  Acute anterior epistaxis      NEW MEDICATIONS STARTED DURING THIS VISIT:  New Prescriptions   No medications on file     Note:  This document was prepared using Dragon voice recognition software and may include unintentional dictation errors.     Sharyn Creamer, MD 11/21/16 2008

## 2016-11-21 NOTE — Discharge Instructions (Signed)
As we discussed, there are several techniques you can use to prevent or stop nosebleeds in the future.    If the bleeding starts up again, gently blow your nose into a tissue to clear the blood and clots, then apply 1-2 sprays to each affected nostril of over-the-counter Afrin nasal spray (oxymetazoline).   Then squeeze your nose shut tightly and DO NOT PEEK for at least 15 minutes.  This will resolve most nosebleeds.  If you continue to have trouble after trying these techniques, or anything seems out of the ordinary or concerns you, please return tot he Emergency Department.

## 2016-11-28 ENCOUNTER — Emergency Department
Admission: EM | Admit: 2016-11-28 | Discharge: 2016-11-28 | Disposition: A | Discharge status: Home / Self Care | Payer: Medicare Other | Attending: Emergency Medicine | Admitting: Emergency Medicine

## 2016-11-28 DIAGNOSIS — I129 Hypertensive chronic kidney disease with stage 1 through stage 4 chronic kidney disease, or unspecified chronic kidney disease: Secondary | ICD-10-CM | POA: Insufficient documentation

## 2016-11-28 DIAGNOSIS — R040 Epistaxis: Secondary | ICD-10-CM | POA: Diagnosis not present

## 2016-11-28 DIAGNOSIS — Z7982 Long term (current) use of aspirin: Secondary | ICD-10-CM | POA: Insufficient documentation

## 2016-11-28 DIAGNOSIS — Z79899 Other long term (current) drug therapy: Secondary | ICD-10-CM | POA: Insufficient documentation

## 2016-11-28 DIAGNOSIS — N182 Chronic kidney disease, stage 2 (mild): Secondary | ICD-10-CM | POA: Insufficient documentation

## 2016-11-28 MED ORDER — PHENYLEPHRINE HCL 0.5 % NA SOLN
1.0000 [drp] | Freq: Once | NASAL | Status: AC
Start: 2016-11-28 — End: 2016-11-28
  Administered 2016-11-28: 1 [drp] via NASAL
  Filled 2016-11-28: qty 15

## 2016-11-28 MED ORDER — SILVER NITRATE-POT NITRATE 75-25 % EX MISC
2.0000 | Freq: Once | CUTANEOUS | Status: AC
  Administered 2016-11-28: 2 via TOPICAL

## 2016-11-28 MED ORDER — SILVER NITRATE-POT NITRATE 75-25 % EX MISC
CUTANEOUS | Status: AC
  Filled 2016-11-28: qty 2

## 2016-11-28 NOTE — ED Notes (Signed)
Pt states he was at home drawing and nose started bleeding - pt had nose bleed last week and was seen in the ED for it - at this time bleeding seems to be stopped with nasal clamp

## 2016-11-28 NOTE — ED Triage Notes (Signed)
Nose bleed that began today at 5PM. Pt had similar episode approx 1 week ago. No prior hx of nose bleeds before.

## 2016-11-28 NOTE — ED Triage Notes (Signed)
Nose clamp applied in triage.

## 2016-11-28 NOTE — ED Provider Notes (Signed)
-----------------------------------------   8:39 PM on 11/28/2016 -----------------------------------------  No further epistaxis 40 minutes after clamp removed. Patient continues to appear well he will follow-up with ENT. I discussed return precautions, he is agreeable to this plan.   Minna AntisKevin Jarica Plass, MD 11/28/16 2039

## 2016-11-28 NOTE — ED Provider Notes (Signed)
[facility] Emergency Department Provider Note  ____________________________________________  Time seen: Approximately 7:44 PM  I have reviewed the triage vital signs and the nursing notes.   HISTORY  Chief Complaint Epistaxis   HPI Danny MathJoseph Crunk Jr. is a 71 y.o. male with h/o HTN who presents for evaluation of epistaxis.Patient reports that he was sitting drawing when he started having epistaxis from his left nostril. Patient was seen here week ago with similar presentation. He is not on any blood thinners. He denies any trauma to his nose. No prior history of bleeding disorder. He arrives with a nasal clip in place. No dizziness, CP, SOB.  Past Medical History:  Diagnosis Date  . Bipolar disorder (HCC)   . Chicken pox   . Glaucoma   . Heart murmur   . History of blood transfusion   . Hypertension     Patient Active Problem List   Diagnosis Date Noted  . CKD (chronic kidney disease) stage 2, GFR 60-89 ml/min 11/18/2016  . Undifferentiated schizophrenia (HCC) 05/18/2016  . Hyperlipidemia 11/19/2015  . Glaucoma 07/24/2015  . Heart murmur, systolic 07/24/2015  . HTN (hypertension) 07/24/2015  . Preventative health care 07/24/2015    Past Surgical History:  Procedure Laterality Date  . ADENOIDECTOMY    . CATARACT EXTRACTION W/PHACO Right 01/13/2016   Procedure: CATARACT EXTRACTION PHACO AND INTRAOCULAR LENS PLACEMENT (IOC);  Surgeon: Lockie Molahadwick Brasington, MD;  [facility];  Service: Ophthalmology;  Laterality: Right;  . CATARACT EXTRACTION W/PHACO Left 09/07/2016   Procedure: CATARACT EXTRACTION PHACO AND INTRAOCULAR LENS PLACEMENT (IOC);  Surgeon: Lockie Molahadwick Brasington, MD;  [facility];  Service: Ophthalmology;  Laterality: Left;  PT WOULD LIKE LATER APPT  . CHOLECYSTECTOMY    . EYE SURGERY    . PENILE PROSTHESIS IMPLANT    . SURGERY SCROTAL / TESTICULAR     Unclear history; Patient has no testicles in  scrotum    Prior to Admission medications   Medication Sig Start Date End Date Taking? Authorizing Provider  amLODipine (NORVASC) 10 MG tablet Take 1 tablet (10 mg total) by mouth daily. 11/18/16   [name], DO  aspirin EC 81 MG tablet Take 1 tablet (81 mg total) by mouth daily. 07/23/15   [name], DO  atorvastatin (LIPITOR) 40 MG tablet TAKE 1 TABLET DAILY 07/20/16   [name], DO  escitalopram (LEXAPRO) 5 MG tablet Take 1 tablet (5 mg total) by mouth daily. 10/26/16   [name], MD  QUEtiapine (SEROQUEL) 50 MG tablet Take 1 tablet (50 mg total) by mouth at bedtime. 10/26/16   [name], MD  rivastigmine (EXELON) 6 MG capsule Take 1 capsule (6 mg total) by mouth 2 (two) times daily. 10/26/16   [name], MD    Allergies Patient has no known allergies.  Family History  Problem Relation Age of Onset  . Arthritis Mother   . Cancer Mother     lung   . Hypertension Mother   . Anxiety disorder Mother   . Depression Mother   . Arthritis Father   . Cancer Father     lung and prostate  . Hypertension Father   . Diabetes Father   . Diabetes Sister   . Leukemia Sister     Social History Social History  Substance Use Topics  . Smoking status: Never Smoker  . Smokeless tobacco: Never Used  . Alcohol use No    Review of Systems  Constitutional: Negative for  fever. Eyes: Negative for visual changes. ENT: Negative for sore throat. + epistaxis Neck: No neck pain  Cardiovascular: Negative for chest pain. Respiratory: Negative for shortness of breath. Gastrointestinal: Negative for abdominal pain, vomiting or diarrhea. Genitourinary: Negative for dysuria. Musculoskeletal: Negative for back pain. Skin: Negative for rash. Neurological: Negative for headaches, weakness or numbness. Psych: No SI or HI  ____________________________________________   PHYSICAL EXAM:  VITAL SIGNS: ED Triage Vitals  Enc Vitals Group     BP 11/28/16 1854 (!) 136/94     Pulse Rate  11/28/16 1854 69     Resp 11/28/16 1854 20     Temp 11/28/16 1854 99 F (37.2 C)     Temp Source 11/28/16 1854 Oral     SpO2 11/28/16 1854 96 %     Weight 11/28/16 1855 200 lb (90.7 kg)     Height 11/28/16 1855 5\' 10"  (1.778 m)     Head Circumference --      Peak Flow --      Pain Score 11/28/16 1907 0     Pain Loc --      Pain Edu? --      Excl. in GC? --     Constitutional: Alert and oriented. Well appearing and in no apparent distress. HEENT:      Head: Normocephalic and atraumatic.         Eyes: Conjunctivae are normal. Sclera is non-icteric. EOMI. PERRL      Mouth/Throat: Mucous membranes are moist.       Nose: No evidence of active bleeding or mucosal tear      Neck: Supple with no signs of meningismus. Cardiovascular: Regular rate and rhythm. No murmurs, gallops, or rubs. 2+ symmetrical distal pulses are present in all extremities. No JVD. Respiratory: Normal respiratory effort. Lungs are clear to auscultation bilaterally. No wheezes, crackles, or rhonchi.  Musculoskeletal: Nontender with normal range of motion in all extremities. No edema, cyanosis, or erythema of extremities. Neurologic: Normal speech and language. Face is symmetric. Moving all extremities. No gross focal neurologic deficits are appreciated. Skin: Skin is warm, dry and intact. No rash noted. Psychiatric: Mood and affect are normal. Speech and behavior are normal.  ____________________________________________   LABS (all labs ordered are listed, but only abnormal results are displayed)  Labs Reviewed - No data to display ____________________________________________  EKG  none ____________________________________________  RADIOLOGY  none  ____________________________________________   PROCEDURES  Procedure(s) performed: None Procedures Critical Care performed:  None ____________________________________________   INITIAL IMPRESSION / ASSESSMENT AND PLAN / ED COURSE  71 y.o. male with  h/o HTN who presents for evaluation of epistaxis.Clots were evacuated upon arrival, Afrin was placed on the left nostril with pressure for 15 minutes. When I examine his left nostril did not see any active bleeding or mucosal tears. Since this is patient's second epistaxis in one week with no source, I recommended that he f/u with ENT to rule out a posterior source of bleeding such as mass. Patient will be observed for 30 min and dc if epistaxis does not recur.      Pertinent labs & imaging results that were available during my care of the patient were reviewed by me and considered in my medical decision making (see chart for details).    ____________________________________________   FINAL CLINICAL IMPRESSION(S) / ED DIAGNOSES  Final diagnoses:  Epistaxis      NEW MEDICATIONS STARTED DURING THIS VISIT:  New Prescriptions   No medications on file  Note:  This document was prepared using Dragon voice recognition software and may include unintentional dictation errors.    Nita Sickle, MD 11/28/16 2007

## 2016-12-20 ENCOUNTER — Ambulatory Visit (INDEPENDENT_AMBULATORY_CARE_PROVIDER_SITE_OTHER): Payer: Medicare Other | Admitting: *Deleted

## 2016-12-20 ENCOUNTER — Encounter: Payer: Self-pay | Admitting: *Deleted

## 2016-12-20 VITALS — BP 146/84 | HR 67 | Resp 12

## 2016-12-20 DIAGNOSIS — I10 Essential (primary) hypertension: Secondary | ICD-10-CM | POA: Diagnosis not present

## 2016-12-20 NOTE — Progress Notes (Signed)
Patient presented for re-check on BP from last OV patient   allowed to rest for 5-8 minutes BP attained in left arm 152/88 pulse 68, patient am elevated to heart level and supported by nurse. Patient was allowed to relax an additional 5 minutes and BP attained in right arm , elevated to heart level and supported by nurse. BP 146/84 pulse 67.

## 2016-12-21 ENCOUNTER — Ambulatory Visit: Payer: 59 | Admitting: Psychiatry

## 2016-12-27 LAB — COLOGUARD: Cologuard: NEGATIVE

## 2016-12-27 NOTE — Progress Notes (Signed)
Care was provided under my supervision. I agree with the management as indicated in the note.  Rayner Erman DO  

## 2017-01-03 ENCOUNTER — Encounter: Payer: Self-pay | Admitting: Family Medicine

## 2017-01-03 ENCOUNTER — Ambulatory Visit: Payer: Medicare Other | Admitting: Family Medicine

## 2017-01-03 NOTE — Progress Notes (Unsigned)
Pt informed of results. He gave verbal understanding.

## 2017-01-05 ENCOUNTER — Encounter: Payer: Self-pay | Admitting: Family Medicine

## 2017-01-05 ENCOUNTER — Ambulatory Visit (INDEPENDENT_AMBULATORY_CARE_PROVIDER_SITE_OTHER): Payer: Medicare Other

## 2017-01-05 ENCOUNTER — Ambulatory Visit (INDEPENDENT_AMBULATORY_CARE_PROVIDER_SITE_OTHER): Payer: Medicare Other | Admitting: Family Medicine

## 2017-01-05 VITALS — BP 126/78 | HR 66 | Temp 98.6°F | Wt 202.8 lb

## 2017-01-05 DIAGNOSIS — M25361 Other instability, right knee: Secondary | ICD-10-CM | POA: Diagnosis not present

## 2017-01-05 NOTE — Progress Notes (Signed)
Pre visit review using our clinic review tool, if applicable. No additional management support is needed unless otherwise documented below in the visit note. 

## 2017-01-05 NOTE — Patient Instructions (Addendum)
We will call with the xray results.  Knee sleeve if you desire.  Follow in 6 months.  Take care  Dr. Adriana Simasook

## 2017-01-05 NOTE — Assessment & Plan Note (Signed)
New problem. Uncertain etiology/prognosis at this time. Exam unremarkable. Xray today for further evaluation.

## 2017-01-05 NOTE — Progress Notes (Signed)
   Subjective:  Patient ID: Danny MathJoseph Velazquez Jr., male    DOB: 17-Oct-1946  Age: 71 y.o. MRN: [id number]  CC: Knee instability, giving way  HPI:  71 year old male with hypertension, hyperlipidemia, schizophrenia, presents with the above complaint.  Patient reports that last month he had significant right knee instability. His occurred for about 2 weeks. He states that he felt as if his knee was going to give way. He states that he actually fell once due to instability. He reports that the difficulty was with ambulation. He denies any pain. No known inciting factor or injury. No associated swelling. He states that this is not occurred again for the past month. No other complaints or concerns at this time.  Social Hx   Social History   Social History  . Marital status: Single    Spouse name: N/A  . Number of children: N/A  . Years of education: N/A   Social History Main Topics  . Smoking status: Never Smoker  . Smokeless tobacco: Never Used  . Alcohol use No  . Drug use: No  . Sexual activity: Not Currently   Other Topics Concern  . None   Social History Narrative  . None    Review of Systems  Constitutional: Negative.   Musculoskeletal:       R knee instability.    Objective:  BP 126/78 (BP Location: Left Arm, Patient Position: Sitting, Cuff Size: Large)   Pulse 66   Temp 98.6 F (37 C) (Oral)   Wt 202 lb 12.8 oz (92 kg)   SpO2 98%   BMI 29.10 kg/m   BP/Weight 01/05/2017 12/20/2016 11/28/2016  Systolic BP 126 146 136  Diastolic BP 78 84 88  Wt. (Lbs) 202.8 - 200  BMI 29.1 - 28.7    Physical Exam  Constitutional: He appears well-developed. No distress.  HENT:  Head: Normocephalic and atraumatic.  Musculoskeletal:  Knee: Normal to inspection with no erythema or effusion or obvious bony abnormalities.  Palpation normal with no warmth, joint line tenderness, patellar tenderness, or condyle tenderness. ROM full in flexion and extension and lower leg  rotation. Ligaments with solid consistent endpoints including ACL, PCL, LCL, MCL. Negative Mcmurray's. Patellar glide without crepitus. Patellar and quadriceps tendons unremarkable.   Neurological: He is alert.  Psychiatric:  Flat affect.  Vitals reviewed.   Lab Results  Component Value Date   WBC 7.0 07/31/2015   HGB 13.7 07/31/2015   HCT 42.8 07/31/2015   PLT 182.0 07/31/2015   GLUCOSE 94 05/18/2016   CHOL 116 05/18/2016   TRIG 119.0 05/18/2016   HDL 40.00 05/18/2016   LDLCALC 52 05/18/2016   ALT 31 05/18/2016   AST 30 05/18/2016   NA 140 05/18/2016   K 4.0 05/18/2016   CL 106 05/18/2016   CREATININE 1.24 05/18/2016   BUN 16 05/18/2016   CO2 27 05/18/2016   HGBA1C 5.5 07/31/2015    Assessment & Plan:   Problem List Items Addressed This Visit    Knee instability, right - Primary    New problem. Uncertain etiology/prognosis at this time. Exam unremarkable. Xray today for further evaluation.      Relevant Orders   DG Knee 3 Views Right     Follow-up: 6 months.  Everlene OtherJayce Ranee Peasley DO [facility] [place]

## 2017-01-09 ENCOUNTER — Ambulatory Visit: Payer: 59 | Admitting: Psychiatry

## 2017-02-15 ENCOUNTER — Ambulatory Visit: Payer: 59 | Admitting: Psychiatry

## 2017-03-15 ENCOUNTER — Ambulatory Visit: Payer: 59 | Admitting: Psychiatry

## 2017-05-01 ENCOUNTER — Ambulatory Visit: Payer: 59 | Admitting: Psychiatry

## 2017-05-09 ENCOUNTER — Encounter: Payer: Self-pay | Admitting: Family Medicine

## 2017-05-15 ENCOUNTER — Ambulatory Visit: Payer: Medicare Other | Admitting: Family Medicine

## 2017-05-18 ENCOUNTER — Ambulatory Visit: Payer: Medicare Other | Admitting: Family Medicine

## 2017-05-22 ENCOUNTER — Ambulatory Visit (INDEPENDENT_AMBULATORY_CARE_PROVIDER_SITE_OTHER): Payer: 59 | Admitting: Psychiatry

## 2017-05-22 ENCOUNTER — Encounter: Payer: Self-pay | Admitting: Psychiatry

## 2017-05-22 VITALS — BP 118/70 | HR 78 | Wt 199.8 lb

## 2017-05-22 DIAGNOSIS — R4189 Other symptoms and signs involving cognitive functions and awareness: Secondary | ICD-10-CM | POA: Diagnosis not present

## 2017-05-22 DIAGNOSIS — F3181 Bipolar II disorder: Secondary | ICD-10-CM

## 2017-05-22 MED ORDER — QUETIAPINE FUMARATE 50 MG PO TABS: 50.0000 mg | ORAL_TABLET | Freq: Every day | ORAL | 1 refills | Status: DC

## 2017-05-22 MED ORDER — ESCITALOPRAM OXALATE 10 MG PO TABS: 10.0000 mg | ORAL_TABLET | Freq: Every day | ORAL | 1 refills | Status: DC

## 2017-05-22 MED ORDER — RIVASTIGMINE TARTRATE 6 MG PO CAPS: 6.0000 mg | ORAL_CAPSULE | Freq: Two times a day (BID) | ORAL | 1 refills | Status: DC

## 2017-05-22 NOTE — Progress Notes (Signed)
Psychiatric MD Progress Note  Patient Identification: Danny Mcgrath. MRN:  [id number] Date of Evaluation:  05/22/2017 Referral Source: Dr Adriana SimasErlene Quan.  Chief Complaint:   Chief Complaint    Medication Refill     Visit Diagnosis:    ICD-10-CM   1. Bipolar 2 disorder (HCC) F31.81   2. Cognitive decline R41.89     History of Present Illness:   Pt is  71 year old divorced white male with history of bipolar  presented for the follow-up appointment. He[place]. He reported that he has been compliant with his medications. He reported that he does not have any refills of his medication. He reported that he had bouts of depression and he wants to have his medications increased. He is currently taking Lexapro. He discuss about the medications in detail. He spends time reading books. He appeared calm and alert during the interview. No perceptual disturbances noted at this time.  He reported that his memory is getting better. He was pleasant and cooperative during the interview.  Pt  currently denied having any suicidal ideations or plans. He denied having any perceptual disturbances.    Associated Signs/Symptoms: Depression Symptoms:  depressed mood, difficulty concentrating, anxiety, (Hypo) Manic Symptoms:  none Anxiety Symptoms:  none Psychotic Symptoms:  Ideas of Reference, Paranoia, PTSD Symptoms: Negative NA  Past Psychiatric History:  H/o Psychiatric admission in [place] Has seen a Psychiatrist in [place]- Does not remember the name.   Previous Psychotropic Medications: Yes  Lithium Zoloft  Substance Abuse History in the last 12 months:  No.  Consequences of Substance Abuse: Negative NA  Past Medical History:  Past Medical History:  Diagnosis Date  . Bipolar disorder (HCC)   . Chicken pox   . Glaucoma   . Heart murmur   . History of blood transfusion   . Hypertension     Past Surgical History:  Procedure Laterality Date  . ADENOIDECTOMY     . CATARACT EXTRACTION W/PHACO Right 01/13/2016   Procedure: CATARACT EXTRACTION PHACO AND INTRAOCULAR LENS PLACEMENT (IOC);  Surgeon: Lockie Mola, MD;  Location: [facility];  Service: Ophthalmology;  Laterality: Right;  . CATARACT EXTRACTION W/PHACO Left 09/07/2016   Procedure: CATARACT EXTRACTION PHACO AND INTRAOCULAR LENS PLACEMENT (IOC);  Surgeon: Lockie Mola, MD;  Location: [facility];  Service: Ophthalmology;  Laterality: Left;  PT WOULD LIKE LATER APPT  . CHOLECYSTECTOMY    . EYE SURGERY    . PENILE PROSTHESIS IMPLANT    . 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  Family History:  Family History  Problem Relation Age of Onset  . Arthritis Mother   . Cancer Mother        lung   . Hypertension Mother   . Anxiety disorder Mother   . Depression Mother   . Arthritis Father   . Cancer Father        lung and prostate  . Hypertension Father   . Diabetes Father   . Diabetes Sister   . Leukemia Sister     Social History:   Social History   Social History  . Marital status: Single    Spouse name: N/A  . Number of children: N/A  . Years of education: N/A   Social History Main Topics  . Smoking status: Never Smoker  . Smokeless  tobacco: Never Used  . Alcohol use No  . Drug use: No  . Sexual activity: Not Currently   Other Topics Concern  . None   Social History Narrative  . None    Additional Social History:  Patient was married 3 times. His first marriage was in 67. He reported that she passed away. Second time he got married in 1989 and it lasted  lasted for 5 years. His third marriage was in 2009  Allergies:  No Known Allergies  Metabolic Disorder Labs: Lab Results  Component Value Date   HGBA1C 5.5 07/31/2015   No results found for: PROLACTIN Lab Results   Component Value Date   CHOL 116 05/18/2016   TRIG 119.0 05/18/2016   HDL 40.00 05/18/2016   CHOLHDL 3 05/18/2016   VLDL 23.8 05/18/2016   LDLCALC 52 05/18/2016   LDLCALC 115 (H) 07/31/2015     Current Medications: Current Outpatient Prescriptions  Medication Sig Dispense Refill  . amLODipine (NORVASC) 10 MG tablet Take 1 tablet (10 mg total) by mouth daily. 90 tablet 3  . aspirin EC 81 MG tablet Take 1 tablet (81 mg total) by mouth daily. 90 tablet 3  . atorvastatin (LIPITOR) 40 MG tablet TAKE 1 TABLET DAILY 90 tablet 3  . escitalopram (LEXAPRO) 10 MG tablet Take 1 tablet (10 mg total) by mouth daily. 90 tablet 1  . QUEtiapine (SEROQUEL) 50 MG tablet Take 1 tablet (50 mg total) by mouth at bedtime. 90 tablet 1  . rivastigmine (EXELON) 6 MG capsule Take 1 capsule (6 mg total) by mouth 2 (two) times daily. 180 capsule 1   No current facility-administered medications for this visit.     Neurologic: Headache: No Seizure: No Paresthesias:No  Musculoskeletal: Strength & Muscle Tone: within normal limits Gait & Station: normal Patient leans: N/A  Psychiatric Specialty Exam: ROS  There were no vitals taken for this visit.There is no height or weight on file to calculate BMI.  General Appearance: Bad Body Odor  Eye Contact:  Fair  Speech:  Clear and Coherent  Volume:  Normal  Mood:  Anxious and Depressed  Affect:  Congruent  Thought Process:  Coherent  Orientation:  Full (Time, Place, and Person)  Thought Content:  WDL  Suicidal Thoughts:  No  Homicidal Thoughts:  No  Memory:  Immediate;   Fair  Judgement:  Good  Insight:  Fair  Psychomotor Activity:  Normal  Concentration:  Fair  Recall:  Poor  Fund of Knowledge:Fair  Language: Fair  Akathisia:  No  Handed:  Right  AIMS (if indicated):    Assets:  Community education officer  ADL's:  Intact  Cognition: Impaired,  Mild  Sleep:  fair    Treatment Plan Summary: Medication management    Discussed with patient about the medications treatment risks benefits and alternatives. He reported that he is compliant with her medications and will take them as prescribed.  Continue Seroquel 50  mg by mouth daily at bedtime for insomnia and hallucinations.  Continue  Exelon 6 mg by mouth twice a day for his memory issues Increase  Lexapro 10 mg daily  Follow-up in 2 months or earlier depending on his symptoms.  Discussed with him about side effects of the medication in detail and he demonstrated understanding   More than 50% of the time spent in psychoeducation, counseling and coordination of care.       This note was generated in part or whole with voice recognition software. Voice  regonition is usually quite accurate but there are transcription errors that can and very often do occur. I apologize for any typographical errors that were not detected and corrected.    Brandy HaleUzma Nora Sabey, MD 8/6/20183:45 PM

## 2017-05-23 ENCOUNTER — Ambulatory Visit
Admission: RE | Admit: 2017-05-23 | Discharge: 2017-05-23 | Disposition: A | Discharge status: Home / Self Care | Payer: Medicare Other | Source: Ambulatory Visit | Attending: Family Medicine | Admitting: Family Medicine

## 2017-05-23 ENCOUNTER — Ambulatory Visit (INDEPENDENT_AMBULATORY_CARE_PROVIDER_SITE_OTHER): Payer: Medicare Other | Admitting: Family Medicine

## 2017-05-23 ENCOUNTER — Encounter: Payer: Self-pay | Admitting: Family Medicine

## 2017-05-23 VITALS — BP 126/78 | HR 62 | Temp 99.0°F | Resp 16 | Wt 201.0 lb

## 2017-05-23 DIAGNOSIS — E785 Hyperlipidemia, unspecified: Secondary | ICD-10-CM

## 2017-05-23 DIAGNOSIS — Z79899 Other long term (current) drug therapy: Secondary | ICD-10-CM

## 2017-05-23 DIAGNOSIS — R600 Localized edema: Secondary | ICD-10-CM | POA: Insufficient documentation

## 2017-05-23 DIAGNOSIS — F203 Undifferentiated schizophrenia: Secondary | ICD-10-CM | POA: Diagnosis not present

## 2017-05-23 DIAGNOSIS — I10 Essential (primary) hypertension: Secondary | ICD-10-CM

## 2017-05-23 NOTE — Patient Instructions (Signed)
You can stop using the brace.  Continue your meds.  We will call with your results.  Take care  Dr. Adriana Simasook

## 2017-05-24 DIAGNOSIS — R600 Localized edema: Secondary | ICD-10-CM | POA: Insufficient documentation

## 2017-05-24 LAB — LIPID PANEL
Cholesterol: 110 mg/dL (ref 0–200)
HDL: 42.9 mg/dL (ref >39.00)
LDL CALC: 54 mg/dL (ref 0–99)
NonHDL: 67.54
Total CHOL/HDL Ratio: 3
Triglycerides: 66 mg/dL (ref 0.0–149.0)
VLDL: 13.2 mg/dL (ref 0.0–40.0)

## 2017-05-24 LAB — COMPREHENSIVE METABOLIC PANEL
ALBUMIN: 4 g/dL (ref 3.5–5.2)
ALK PHOS: 132 U/L — AB (ref 39–117)
ALT: 16 U/L (ref 0–53)
AST: 19 U/L (ref 0–37)
BUN: 9 mg/dL (ref 6–23)
CHLORIDE: 106 meq/L (ref 96–112)
CO2: 30 mEq/L (ref 19–32)
Calcium: 9 mg/dL (ref 8.4–10.5)
Creatinine, Ser: 1.24 mg/dL (ref 0.40–1.50)
GFR: 60.75 mL/min (ref >60.00)
GLUCOSE: 93 mg/dL (ref 70–99)
POTASSIUM: 3.9 meq/L (ref 3.5–5.1)
SODIUM: 140 meq/L (ref 135–145)
TOTAL PROTEIN: 7.2 g/dL (ref 6.0–8.3)
Total Bilirubin: 0.8 mg/dL (ref 0.2–1.2)

## 2017-05-24 LAB — CBC
HEMATOCRIT: 37.8 % — AB (ref 39.0–52.0)
HEMOGLOBIN: 12.1 g/dL — AB (ref 13.0–17.0)
MCHC: 32.1 g/dL (ref 30.0–36.0)
MCV: 85.1 fl (ref 78.0–100.0)
Platelets: 162 10*3/uL (ref 150.0–400.0)
RBC: 4.44 Mil/uL (ref 4.22–5.81)
RDW: 17.5 % — ABNORMAL HIGH (ref 11.5–15.5)
WBC: 6.5 10*3/uL (ref 4.0–10.5)

## 2017-05-24 LAB — HEMOGLOBIN A1C: Hgb A1c MFr Bld: 5.6 % (ref 4.6–6.5)

## 2017-05-24 NOTE — Progress Notes (Signed)
Subjective:  Patient ID: Danny MathJoseph Petrich Jr., male    DOB: 10-04-1946  Age: 71 y.o. MRN: [id number]  CC: Follow up  HPI:  71 year old male with schizophrenia, hypertension, hyperlipidemia presents for follow-up. Issues/concerns are below.  Schizophrenia  Appears to be stable at this time. Continues to see psychiatry. Is currently on Seroquel, Exelon, and Lexapro.  Hypertension  At goal on amlodipine.  Hyperlipidemia  Has been stable on Lipitor.  Right leg edema  New problem.  Patient has severe edema of the right lower leg.  He attributes this to a knee brace that he's been wearing. He has been wearing this brace for months and has not taken it off. He states that he was instructed to do so by our office. I think there was some miscommunication.  No recent fall, trauma, injury.  No recent travel.  Social Hx   Social History   Social History  . Marital status: Single    Spouse name: N/A  . Number of children: N/A  . Years of education: N/A   Social History Main Topics  . Smoking status: Never Smoker  . Smokeless tobacco: Never Used  . Alcohol use No  . Drug use: No  . Sexual activity: Not Currently   Other Topics Concern  . None   Social History Narrative  . None    Review of Systems  Constitutional: Negative.   Respiratory: Negative.   Cardiovascular: Positive for leg swelling.   Objective:  BP 126/78 (BP Location: Left Arm, Patient Position: Sitting, Cuff Size: Normal)   Pulse 62   Temp 99 F (37.2 C) (Oral)   Resp 16   Wt 201 lb (91.2 kg)   SpO2 94%   BMI 28.84 kg/m   BP/Weight 05/23/2017 05/22/2017 01/05/2017  Systolic BP 126 118 126  Diastolic BP 78 70 78  Wt. (Lbs) 201 199.8 202.8  BMI 28.84 28.67 29.1   Physical Exam  Constitutional:  Well dressed and well groomed but has bad body odor.  Cardiovascular: Normal rate and regular rhythm.   Murmur heard. Right lower leg with 2-3+ pitting lower extremity edema.  Pulmonary/Chest:  Effort normal. He has no wheezes. He has no rales.  Neurological: He is alert.  Skin: Skin is warm. No rash noted.  Psychiatric:  Flat affect. Tangential.  Vitals reviewed.  Lab Results  Component Value Date   WBC 7.0 07/31/2015   HGB 13.7 07/31/2015   HCT 42.8 07/31/2015   PLT 182.0 07/31/2015   GLUCOSE 94 05/18/2016   CHOL 116 05/18/2016   TRIG 119.0 05/18/2016   HDL 40.00 05/18/2016   LDLCALC 52 05/18/2016   ALT 31 05/18/2016   AST 30 05/18/2016   NA 140 05/18/2016   K 4.0 05/18/2016   CL 106 05/18/2016   CREATININE 1.24 05/18/2016   BUN 16 05/18/2016   CO2 27 05/18/2016   HGBA1C 5.5 07/31/2015    Assessment & Plan:   Problem List Items Addressed This Visit    HTN (hypertension)    Stable. Continue amlodipine.      Relevant Orders   Comprehensive metabolic panel   Hyperlipidemia    Stable. Labs today. Continue Lipitor.      Relevant Orders   Lipid panel   Lower extremity edema    Ultrasound obtained and was negative for DVT. Patient's edema appears to be from excessive compression. Discontinue use of knee sleeve. Supportive care.      Relevant Orders   US Venous Img Lower Unilateral Right (  Completed)   Undifferentiated schizophrenia (HCC) - Primary    Appears to be stable. Continue current medications. Continue to follow with psychiatry.       Other Visit Diagnoses    High risk medication use       Relevant Orders   CBC   Hemoglobin A1c     Follow-up: 6 months  Dalynn Jhaveri Adriana Simas DO [facility]

## 2017-05-24 NOTE — Assessment & Plan Note (Signed)
Stable. °-Continue amlodipine °

## 2017-05-24 NOTE — Assessment & Plan Note (Signed)
Stable. Labs today. Continue Lipitor. 

## 2017-05-24 NOTE — Assessment & Plan Note (Signed)
Ultrasound obtained and was negative for DVT. Patient's edema appears to be from excessive compression. Discontinue use of knee sleeve. Supportive care.

## 2017-05-24 NOTE — Assessment & Plan Note (Signed)
Appears to be stable. Continue current medications. Continue to follow with psychiatry.

## 2017-06-07 ENCOUNTER — Telehealth: Payer: Self-pay | Admitting: *Deleted

## 2017-06-07 ENCOUNTER — Other Ambulatory Visit: Payer: Self-pay | Admitting: Family Medicine

## 2017-06-07 DIAGNOSIS — D649 Anemia, unspecified: Secondary | ICD-10-CM

## 2017-06-07 NOTE — Telephone Encounter (Signed)
Orders have been placed by Dr. Adriana Simas

## 2017-06-07 NOTE — Telephone Encounter (Signed)
Pt coming in for labs tomorrow (8/23). Please place future lab orders.  Thanks  

## 2017-06-08 ENCOUNTER — Other Ambulatory Visit (INDEPENDENT_AMBULATORY_CARE_PROVIDER_SITE_OTHER): Payer: Medicare Other

## 2017-06-08 DIAGNOSIS — D649 Anemia, unspecified: Secondary | ICD-10-CM | POA: Diagnosis not present

## 2017-06-08 LAB — VITAMIN B12: VITAMIN B 12: 292 pg/mL (ref 211–911)

## 2017-06-08 LAB — FOLATE: FOLATE: 8.5 ng/mL (ref >5.9)

## 2017-06-09 LAB — IRON,TIBC AND FERRITIN PANEL
%SAT: 9 % — ABNORMAL LOW (ref 15–60)
Ferritin: 12 ng/mL — ABNORMAL LOW (ref 20–380)
Iron: 35 ug/dL — ABNORMAL LOW (ref 50–180)
TIBC: 374 ug/dL (ref 250–425)

## 2017-06-26 ENCOUNTER — Telehealth: Payer: Self-pay | Admitting: *Deleted

## 2017-06-26 ENCOUNTER — Other Ambulatory Visit: Payer: Medicare Other

## 2017-06-26 ENCOUNTER — Other Ambulatory Visit: Payer: Self-pay | Admitting: Family Medicine

## 2017-06-26 DIAGNOSIS — D649 Anemia, unspecified: Secondary | ICD-10-CM

## 2017-06-26 NOTE — Telephone Encounter (Signed)
Pt dropped off ifob. Please place FUTURE order.  Thanks

## 2017-06-27 ENCOUNTER — Other Ambulatory Visit (INDEPENDENT_AMBULATORY_CARE_PROVIDER_SITE_OTHER): Payer: Medicare Other

## 2017-06-27 DIAGNOSIS — D649 Anemia, unspecified: Secondary | ICD-10-CM

## 2017-06-27 LAB — FECAL OCCULT BLOOD, IMMUNOCHEMICAL: FECAL OCCULT BLD: NEGATIVE

## 2017-06-29 ENCOUNTER — Other Ambulatory Visit: Payer: Self-pay | Admitting: Family Medicine

## 2017-06-29 ENCOUNTER — Telehealth: Payer: Self-pay | Admitting: *Deleted

## 2017-06-29 MED ORDER — FERROUS SULFATE 325 (65 FE) MG PO TABS: 325.0000 mg | ORAL_TABLET | Freq: Two times a day (BID) | ORAL | 3 refills | Status: DC

## 2017-06-29 NOTE — Telephone Encounter (Signed)
Pt quested lab results Pt contact [phone]

## 2017-06-29 NOTE — Telephone Encounter (Signed)
Left voice mail to call back 

## 2017-06-29 NOTE — Telephone Encounter (Signed)
Patient advised of lab results see lab results for documentation

## 2017-07-04 ENCOUNTER — Other Ambulatory Visit: Payer: Self-pay | Admitting: Family Medicine

## 2017-07-11 ENCOUNTER — Other Ambulatory Visit: Payer: Self-pay | Admitting: *Deleted

## 2017-07-11 MED ORDER — FERROUS SULFATE 325 (65 FE) MG PO TABS: 325.0000 mg | ORAL_TABLET | Freq: Two times a day (BID) | ORAL | 3 refills | Status: DC

## 2017-07-24 ENCOUNTER — Ambulatory Visit: Payer: 59 | Admitting: Psychiatry

## 2017-08-02 ENCOUNTER — Telehealth: Payer: Self-pay | Admitting: *Deleted

## 2017-08-02 DIAGNOSIS — D649 Anemia, unspecified: Secondary | ICD-10-CM

## 2017-08-02 NOTE — Telephone Encounter (Signed)
Pt was scheduled a lab appt on 06/29/17. No documentation has been found explaining why he needed a lab appt. There are no lab orders found. Please contact patient & cancel lab appt if not needed. Otherwise, future lab orders need to be placed prior to his appt on 08/03/17.

## 2017-08-02 NOTE — Telephone Encounter (Signed)
Order placed. Please see if he ever got set up for a colonoscopy. It appears this was recommended by Dr Adriana Simasook.

## 2017-08-02 NOTE — Telephone Encounter (Signed)
Looks like Dr Adriana Simasook wanted to check from lab 06/08/17.

## 2017-08-03 ENCOUNTER — Other Ambulatory Visit (INDEPENDENT_AMBULATORY_CARE_PROVIDER_SITE_OTHER): Payer: Medicare Other

## 2017-08-03 DIAGNOSIS — D649 Anemia, unspecified: Secondary | ICD-10-CM | POA: Diagnosis not present

## 2017-08-03 NOTE — Telephone Encounter (Signed)
Pt declined the colonoscopy because the last time he went they sent him home because he did not have anyone with him that could drive him home. So he ended up doing the Cologuard & pt states that it was normal (see health maintenance)

## 2017-08-03 NOTE — Addendum Note (Signed)
Addended by: Penne LashWIGGINS, Fortunato Nordin N on: 08/03/2017 01:30 PM   Modules accepted: Orders

## 2017-08-03 NOTE — Telephone Encounter (Signed)
Noted and seen.  

## 2017-08-04 ENCOUNTER — Ambulatory Visit (INDEPENDENT_AMBULATORY_CARE_PROVIDER_SITE_OTHER): Payer: Medicare Other

## 2017-08-04 DIAGNOSIS — Z23 Encounter for immunization: Secondary | ICD-10-CM | POA: Diagnosis not present

## 2017-08-04 LAB — CBC
HEMATOCRIT: 40.2 % (ref 38.5–50.0)
HEMOGLOBIN: 13.7 g/dL (ref 13.2–17.1)
MCH: 29 pg (ref 27.0–33.0)
MCHC: 34.1 g/dL (ref 32.0–36.0)
MCV: 85 fL (ref 80.0–100.0)
MPV: 10.1 fL (ref 7.5–12.5)
Platelets: 175 10*3/uL (ref 140–400)
RBC: 4.73 10*6/uL (ref 4.20–5.80)
RDW: 17.1 % — AB (ref 11.0–15.0)
WBC: 7.8 10*3/uL (ref 3.8–10.8)

## 2017-08-04 LAB — IRON,TIBC AND FERRITIN PANEL
%SAT: 37 % (ref 15–60)
FERRITIN: 34 ng/mL (ref 20–380)
Iron: 131 ug/dL (ref 50–180)
TIBC: 356 ug/dL (ref 250–425)

## 2017-08-08 ENCOUNTER — Other Ambulatory Visit: Payer: Self-pay | Admitting: Family Medicine

## 2017-08-08 DIAGNOSIS — D509 Iron deficiency anemia, unspecified: Secondary | ICD-10-CM

## 2017-09-11 ENCOUNTER — Ambulatory Visit: Payer: Medicare Other | Admitting: Psychiatry

## 2017-09-12 ENCOUNTER — Other Ambulatory Visit: Payer: Medicare Other

## 2017-09-14 ENCOUNTER — Other Ambulatory Visit (INDEPENDENT_AMBULATORY_CARE_PROVIDER_SITE_OTHER): Payer: Medicare Other

## 2017-09-14 DIAGNOSIS — D509 Iron deficiency anemia, unspecified: Secondary | ICD-10-CM

## 2017-09-14 LAB — CBC
HEMATOCRIT: 40.2 % (ref 39.0–52.0)
HEMOGLOBIN: 13.4 g/dL (ref 13.0–17.0)
MCHC: 33.4 g/dL (ref 30.0–36.0)
MCV: 90.6 fl (ref 78.0–100.0)
Platelets: 190 10*3/uL (ref 150.0–400.0)
RBC: 4.44 Mil/uL (ref 4.22–5.81)
RDW: 16.3 % — AB (ref 11.5–15.5)
WBC: 5.7 10*3/uL (ref 4.0–10.5)

## 2017-09-15 LAB — IRON,TIBC AND FERRITIN PANEL
%SAT: 12 % — AB (ref 15–60)
Ferritin: 92 ng/mL (ref 20–380)
Iron: 34 ug/dL — ABNORMAL LOW (ref 50–180)
TIBC: 284 mcg/dL (calc) (ref 250–425)

## 2017-09-19 ENCOUNTER — Telehealth: Payer: Self-pay | Admitting: Family Medicine

## 2017-09-19 ENCOUNTER — Other Ambulatory Visit: Payer: Self-pay | Admitting: Family Medicine

## 2017-09-19 DIAGNOSIS — D509 Iron deficiency anemia, unspecified: Secondary | ICD-10-CM

## 2017-09-19 DIAGNOSIS — R319 Hematuria, unspecified: Secondary | ICD-10-CM

## 2017-09-19 NOTE — Telephone Encounter (Signed)
Pt scheduled for U/A 09/21/17 @ 1400

## 2017-09-21 ENCOUNTER — Other Ambulatory Visit (INDEPENDENT_AMBULATORY_CARE_PROVIDER_SITE_OTHER): Payer: Medicare Other

## 2017-09-21 DIAGNOSIS — R319 Hematuria, unspecified: Secondary | ICD-10-CM | POA: Diagnosis not present

## 2017-09-21 DIAGNOSIS — D509 Iron deficiency anemia, unspecified: Secondary | ICD-10-CM

## 2017-09-21 LAB — POCT URINALYSIS DIPSTICK
Bilirubin, UA: NEGATIVE
Glucose, UA: NEGATIVE
KETONES UA: NEGATIVE
Nitrite, UA: POSITIVE
PH UA: 6.5 (ref 5.0–8.0)
PROTEIN UA: 30
Spec Grav, UA: 1.015 (ref 1.010–1.025)
Urobilinogen, UA: 0.2 E.U./dL

## 2017-09-21 LAB — URINALYSIS, MICROSCOPIC ONLY

## 2017-09-21 NOTE — Addendum Note (Signed)
Addended by: Penne LashWIGGINS, Nastasha Reising N on: 09/21/2017 03:03 PM   Modules accepted: Orders

## 2017-09-28 ENCOUNTER — Other Ambulatory Visit: Payer: Self-pay | Admitting: Family Medicine

## 2017-09-28 DIAGNOSIS — R8290 Unspecified abnormal findings in urine: Secondary | ICD-10-CM

## 2017-09-29 ENCOUNTER — Other Ambulatory Visit: Payer: Medicare Other

## 2017-10-02 ENCOUNTER — Other Ambulatory Visit: Payer: Medicare Other

## 2017-10-02 NOTE — Addendum Note (Signed)
Addended by: Penne LashWIGGINS, Santiago Graf N on: 10/02/2017 03:08 PM   Modules accepted: Orders

## 2017-10-03 ENCOUNTER — Other Ambulatory Visit: Payer: Medicare Other

## 2017-10-03 DIAGNOSIS — R8290 Unspecified abnormal findings in urine: Secondary | ICD-10-CM

## 2017-10-05 LAB — URINE CULTURE
MICRO NUMBER:: 81421507
SPECIMEN QUALITY:: ADEQUATE

## 2017-10-06 ENCOUNTER — Telehealth: Payer: Self-pay | Admitting: Family Medicine

## 2017-10-06 NOTE — Telephone Encounter (Signed)
Left message to return call to ask patient if his urinary frequency has increased, and if it has we will send in antibiotics, ok for pec to speak to patient. Please also verify pharmacy

## 2017-10-06 NOTE — Telephone Encounter (Signed)
See result note.  

## 2017-10-06 NOTE — Telephone Encounter (Signed)
-----   Message from Inetta FermoJessica S Hendricks, [place] sent at 10/06/2017  1:53 PM EST ----- Left message to return call, ok for PEC to give results and speak to patient

## 2017-10-06 NOTE — Telephone Encounter (Signed)
fyi

## 2017-10-06 NOTE — Telephone Encounter (Signed)
Called in returning a call  The office made to him regarding a urine test.   He said he spoke with someone today about his urine test already.   He was wondering if he needed antibiotics for the urine infection (E coli).  I am routing a note to Dr. Purvis SheffieldSonnenberg's nurse pool for clarification.

## 2017-10-06 NOTE — Telephone Encounter (Signed)
FYI  Copied from CRM [phone]. Topic: General - Call Back - No Documentation >> Oct 06, 2017  9:13 AM Gerrianne ScalePayne, Angela L wrote: Reason for CRM: patient states that someone called him today about labs patient states that he is not having any urinary symptoms he just go every one to one in a half hours to urinate please call pt back

## 2017-10-06 NOTE — Telephone Encounter (Signed)
Please contact him and let him know there was bacteria in his urine. Please see if his urinary frequency has increased and if it has let him know that we will place him on an antibiotic for the bacteria.

## 2017-10-09 ENCOUNTER — Ambulatory Visit: Payer: Medicare Other | Admitting: Psychiatry

## 2017-10-16 ENCOUNTER — Other Ambulatory Visit: Payer: Self-pay | Admitting: Psychiatry

## 2017-10-23 ENCOUNTER — Telehealth: Payer: Self-pay

## 2017-10-23 NOTE — Telephone Encounter (Signed)
Informed patient it was an appointment reminder call

## 2017-10-23 NOTE — Telephone Encounter (Signed)
Copied from CRM #[id number]. Topic: Quick Communication - Office Called Patient >> Oct 23, 2017  9:37 AM Danny Mcgrath, Danny Mcgrath, [place] wrote: CRM for notification. See Telephone encounter for: Patient had a missed call from the office and would like a call back at [phone]  10/23/17. Reason for CRM:

## 2017-10-24 ENCOUNTER — Other Ambulatory Visit: Payer: Self-pay

## 2017-10-24 ENCOUNTER — Encounter: Payer: Self-pay | Admitting: Family Medicine

## 2017-10-24 ENCOUNTER — Ambulatory Visit: Payer: Medicare Other | Admitting: Family Medicine

## 2017-10-24 VITALS — BP 144/84 | HR 61 | Temp 99.1°F | Ht 70.0 in | Wt 202.6 lb

## 2017-10-24 DIAGNOSIS — K089 Disorder of teeth and supporting structures, unspecified: Secondary | ICD-10-CM

## 2017-10-24 DIAGNOSIS — Z125 Encounter for screening for malignant neoplasm of prostate: Secondary | ICD-10-CM

## 2017-10-24 DIAGNOSIS — Z8042 Family history of malignant neoplasm of prostate: Secondary | ICD-10-CM | POA: Diagnosis not present

## 2017-10-24 DIAGNOSIS — I10 Essential (primary) hypertension: Secondary | ICD-10-CM

## 2017-10-24 DIAGNOSIS — R42 Dizziness and giddiness: Secondary | ICD-10-CM

## 2017-10-24 DIAGNOSIS — D649 Anemia, unspecified: Secondary | ICD-10-CM | POA: Diagnosis not present

## 2017-10-24 DIAGNOSIS — Z0001 Encounter for general adult medical examination with abnormal findings: Secondary | ICD-10-CM | POA: Diagnosis not present

## 2017-10-24 NOTE — Patient Instructions (Signed)
Nice to see you. Please continue to work on diet and exercise. Please try to decrease sugary food intake and increase vegetable intake. Please do the exercises for your dizziness.  If this does not improve please let us know. If your BPH symptoms get worse please let us know. Please try to see a dentist. We will get lab work and contact you with results.

## 2017-10-24 NOTE — Progress Notes (Signed)
Danny Alar, MD Phone: [phone]  Danny Mcgrath. is a 72 y.o. male who presents today for physical exam.  Exercises by lifting some weights and walking. Tries to watch what he eats.  Does not get very many vegetables.  Does drink sweet tea most days. Tetanus vaccination up-to-date. Father with prostate cancer history. Cologuard previously completed. Hepatitis C testing up-to-date. Flu vaccination up-to-date. No tobacco use or illicit drug use.  Rare alcohol use.  Patient reports intermittent vertigo symptoms for several weeks.  Typically occurs for about 5 seconds when he lays down at night or when he gets up out of bed.  No lightheadedness.  No tinnitus.  No loss of hearing.  No ear fullness.  BPH: Patient notes issues with starting and stopping with his urine.  Not as bad as it used to be.  Occasional leakage if he does not get to the bathroom quickly enough.  Feels like he mostly empties his bladder.  Nocturia 2-3 times a night.  Patient does have very poor dentition.  He has seen a dentist though cannot afford to have much done.  Active Ambulatory Problems    Diagnosis Date Noted  . Glaucoma 07/24/2015  . Heart murmur, systolic 07/24/2015  . HTN (hypertension) 07/24/2015  . Encounter for general adult medical examination with abnormal findings 07/24/2015  . Hyperlipidemia 11/19/2015  . Undifferentiated schizophrenia (HCC) 05/18/2016  . CKD (chronic kidney disease) stage 2, GFR 60-89 ml/min 11/18/2016  . 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 Lower extremity edema 05/24/2017  . Family history of prostate cancer in father 10/25/2017  . Vertigo 10/25/2017  . Poor dentition 10/25/2017   Resolved Ambulatory Problems    Diagnosis Date Noted  . Decreased GFR 11/19/2015   Past Medical History:  Diagnosis Date  . Bipolar disorder (HCC)   . Chicken pox   . Glaucoma   . Heart murmur   . History of blood transfusion   . Hypertension     Family History  Problem  Relation Age of Onset  . Arthritis Mother   . Cancer Mother        lung   . Hypertension Mother   . Anxiety disorder Mother   . Depression Mother   . Arthritis Father   . Cancer Father        lung and prostate  . Hypertension Father   . Diabetes Father   . Diabetes Sister   . Leukemia Sister     Social History   Socioeconomic History  . Marital status: Single    Spouse name: Not on file  . Number of children: Not on file  . Years of education: Not on file  . Highest education level: Not on file  Social Needs  . Financial resource strain: Not on file  . Food insecurity - worry: Not on file  . Food insecurity - inability: Not on file  . Transportation needs - medical: Not on file  . Transportation needs - non-medical: Not on file  Occupational History  . Not on file  Tobacco Use  . Smoking status: Never Smoker  . Smokeless tobacco: Never Used  Substance and Sexual Activity  . Alcohol use: No    Alcohol/week: 0.0 oz  . Drug use: No  . Sexual activity: Not Currently  Other Topics Concern  . Not on file  Social History Narrative  . Not on file    ROS  General:  Negative for nexplained weight loss, fever Skin: Negative for new or changing  mole, sore that won't heal HEENT: Negative for trouble hearing, trouble seeing, ringing in ears, mouth sores, hoarseness, change in voice, dysphagia. CV:  Negative for chest pain, dyspnea, edema, palpitations Resp: Negative for cough, dyspnea, hemoptysis GI: Negative for nausea, vomiting, diarrhea, constipation, abdominal pain, melena, hematochezia. GU: Positive for urinary incontinence, difficulty starting/keeping stream, negative for dysuria, hematuria, vaginal or penile discharge, polyuria, sexual difficulty, lumps in testicle or breasts MSK: Negative for muscle cramps or aches, joint pain or swelling Neuro: Positive for dizziness, negative for headaches, weakness, numbness, passing out/fainting Psych: Negative for depression,  anxiety, memory problems  Objective  Physical Exam Vitals:   10/24/17 1556  BP: (!) 144/84  Pulse: 61  Temp: 99.1 F (37.3 C)  SpO2: 97%    BP Readings from Last 3 Encounters:  10/24/17 (!) 144/84  05/23/17 126/78  01/05/17 126/78   Wt Readings from Last 3 Encounters:  10/24/17 202 lb 9.6 oz (91.9 kg)  05/23/17 201 lb (91.2 kg)  01/05/17 202 lb 12.8 oz (92 kg)    Physical Exam  Constitutional: No distress.  HENT:  Head: Normocephalic and atraumatic.  Mouth/Throat: Oropharynx is clear and moist. No oropharyngeal exudate.  Eyes: Conjunctivae are normal. Pupils are equal, round, and reactive to light.  Cardiovascular: Normal rate, regular rhythm and normal heart sounds.  Pulmonary/Chest: Effort normal and breath sounds normal.  Abdominal: Soft. Bowel sounds are normal. He exhibits no distension. There is no tenderness.  Genitourinary: Rectum normal.  Genitourinary Comments: Prostate mildly enlarged, no nodules  Musculoskeletal: He exhibits no edema.  Neurological: He is alert. Gait normal.  Skin: Skin is warm and dry. He is not diaphoretic.  Psychiatric: Mood and affect normal.  Negative Dix-Hallpike Patient has poor dentition.  Assessment/Plan:   Encounter for general adult medical examination with abnormal findings Physical exam completed.  Encourage diet and exercise.  Prior screening lab work completed.  Will order PSA as well as follow-up CBC.  HTN (hypertension) Has been well controlled.  Reports well-controlled at home.  He will monitor at home.  Continue current medication.  Family history of prostate cancer in father PSA checked.  Discussed yearly exam.  Vertigo History is most consistent with BPPV.  He had a negative Dix-Hallpike exam today and notes no vertigo today.  We will provide him with Epley maneuver to do at home.  If not improving consider vestibular rehab versus ENT evaluation.  Poor dentition Patient with pretty poor dentition.  Discussed  following up with his dentist.   Orders Placed This Encounter  Procedures  . PSA, Medicare  . CBC  . Iron, TIBC and Ferritin Panel    No orders of the defined types were placed in this encounter.    Danny AlarEric Jared Cahn, MD [facility]

## 2017-10-25 DIAGNOSIS — Z8042 Family history of malignant neoplasm of prostate: Secondary | ICD-10-CM | POA: Insufficient documentation

## 2017-10-25 DIAGNOSIS — R42 Dizziness and giddiness: Secondary | ICD-10-CM | POA: Insufficient documentation

## 2017-10-25 DIAGNOSIS — K089 Disorder of teeth and supporting structures, unspecified: Secondary | ICD-10-CM | POA: Insufficient documentation

## 2017-10-25 LAB — IRON,TIBC AND FERRITIN PANEL
%SAT: 91 % (calc) — ABNORMAL HIGH (ref 15–60)
FERRITIN: 34 ng/mL (ref 20–380)
IRON: 312 ug/dL — AB (ref 50–180)
TIBC: 344 mcg/dL (calc) (ref 250–425)

## 2017-10-25 LAB — CBC
HCT: 41.9 % (ref 39.0–52.0)
HEMOGLOBIN: 14.1 g/dL (ref 13.0–17.0)
MCHC: 33.8 g/dL (ref 30.0–36.0)
MCV: 93.7 fl (ref 78.0–100.0)
PLATELETS: 153 10*3/uL (ref 150.0–400.0)
RBC: 4.47 Mil/uL (ref 4.22–5.81)
RDW: 15.1 % (ref 11.5–15.5)
WBC: 7.1 10*3/uL (ref 4.0–10.5)

## 2017-10-25 LAB — PSA, MEDICARE: PSA: 0.56 ng/ml (ref 0.10–4.00)

## 2017-10-25 NOTE — Assessment & Plan Note (Signed)
History is most consistent with BPPV.  He had a negative Dix-Hallpike exam today and notes no vertigo today.  We will provide him with Epley maneuver to do at home.  If not improving consider vestibular rehab versus ENT evaluation.

## 2017-10-25 NOTE — Assessment & Plan Note (Signed)
PSA checked.  Discussed yearly exam.

## 2017-10-25 NOTE — Assessment & Plan Note (Signed)
Patient with pretty poor dentition.  Discussed following up with his dentist.

## 2017-10-25 NOTE — Assessment & Plan Note (Addendum)
Has been well controlled.  Reports well-controlled at home.  He will monitor at home.  Continue current medication.

## 2017-10-25 NOTE — Assessment & Plan Note (Signed)
Physical exam completed.  Encourage diet and exercise.  Prior screening lab work completed.  Will order PSA as well as follow-up CBC.

## 2017-10-28 ENCOUNTER — Other Ambulatory Visit: Payer: Self-pay | Admitting: Family Medicine

## 2017-10-28 DIAGNOSIS — E611 Iron deficiency: Secondary | ICD-10-CM

## 2017-11-02 ENCOUNTER — Ambulatory Visit: Payer: Medicare Other | Admitting: Psychiatry

## 2017-11-02 ENCOUNTER — Other Ambulatory Visit: Payer: Self-pay | Admitting: Family Medicine

## 2017-11-02 ENCOUNTER — Encounter: Payer: Self-pay | Admitting: Psychiatry

## 2017-11-02 VITALS — BP 154/84 | HR 73 | Temp 97.7°F | Wt 205.4 lb

## 2017-11-02 DIAGNOSIS — F3181 Bipolar II disorder: Secondary | ICD-10-CM

## 2017-11-02 DIAGNOSIS — F09 Unspecified mental disorder due to known physiological condition: Secondary | ICD-10-CM | POA: Diagnosis not present

## 2017-11-02 MED ORDER — QUETIAPINE FUMARATE 50 MG PO TABS: 50.0000 mg | ORAL_TABLET | Freq: Every day | ORAL | 0 refills | Status: DC

## 2017-11-02 MED ORDER — ESCITALOPRAM OXALATE 10 MG PO TABS: 10.0000 mg | ORAL_TABLET | Freq: Every day | ORAL | 0 refills | Status: DC

## 2017-11-02 MED ORDER — LAMOTRIGINE 25 MG PO TABS: 25.0000 mg | ORAL_TABLET | Freq: Every evening | ORAL | 1 refills | Status: DC

## 2017-11-02 MED ORDER — RIVASTIGMINE TARTRATE 6 MG PO CAPS: 6.0000 mg | ORAL_CAPSULE | Freq: Two times a day (BID) | ORAL | 0 refills | Status: DC

## 2017-11-02 NOTE — Patient Instructions (Signed)
Lamotrigine tablets What is this medicine? LAMOTRIGINE (la MOE tri jeen) is used to control seizures in adults and children with epilepsy and Lennox-Gastaut syndrome. It is also used in adults to treat bipolar disorder. This medicine may be used for other purposes; ask your health care provider or pharmacist if you have questions. COMMON BRAND NAME(S): Lamictal What should I tell my health care provider before I take this medicine? They need to know if you have any of these conditions: -a history of depression or bipolar disorder -aseptic meningitis during prior use of lamotrigine -folate deficiency -kidney disease -liver disease -suicidal thoughts, plans, or attempt; a previous suicide attempt by you or a family member -an unusual or allergic reaction to lamotrigine or other seizure medications, other medicines, foods, dyes, or preservatives -pregnant or trying to get pregnant -breast-feeding How should I use this medicine? Take this medicine by mouth with a glass of water. Follow the directions on the prescription label. Do not chew these tablets. If this medicine upsets your stomach, take it with food or milk. Take your doses at regular intervals. Do not take your medicine more often than directed. A special MedGuide will be given to you by the pharmacist with each new prescription and refill. Be sure to read this information carefully each time. Talk to your pediatrician regarding the use of this medicine in children. While this drug may be prescribed for children as young as 2 years for selected conditions, precautions do apply. Overdosage: If you think you have taken too much of this medicine contact a poison control center or emergency room at once. NOTE: This medicine is only for you. Do not share this medicine with others. What if I miss a dose? If you miss a dose, take it as soon as you can. If it is almost time for your next dose, take only that dose. Do not take double or extra  doses. What may interact with this medicine? -carbamazepine -male hormones, including contraceptive or birth control pills -methotrexate -phenobarbital -phenytoin -primidone -pyrimethamine -rifampin -trimethoprim -valproic acid This list may not describe all possible interactions. Give your health care provider a list of all the medicines, herbs, non-prescription drugs, or dietary supplements you use. Also tell them if you smoke, drink alcohol, or use illegal drugs. Some items may interact with your medicine. What should I watch for while using this medicine? Visit your doctor or health care professional for regular checks on your progress. If you take this medicine for seizures, wear a Medic Alert bracelet or necklace. Carry an identification card with information about your condition, medicines, and doctor or health care professional. It is important to take this medicine exactly as directed. When first starting treatment, your dose will need to be adjusted slowly. It may take weeks or months before your dose is stable. You should contact your doctor or health care professional if your seizures get worse or if you have any new types of seizures. Do not stop taking this medicine unless instructed by your doctor or health care professional. Stopping your medicine suddenly can increase your seizures or their severity. Contact your doctor or health care professional right away if you develop a rash while taking this medicine. Rashes may be very severe and sometimes require treatment in the hospital. Deaths from rashes have occurred. Serious rashes occur more often in children than adults taking this medicine. It is more common for these serious rashes to occur during the first 2 months of treatment, but a rash can   occur at any time. You may get drowsy, dizzy, or have blurred vision. Do not drive, use machinery, or do anything that needs mental alertness until you know how this medicine affects you.  To reduce dizzy or fainting spells, do not sit or stand up quickly, especially if you are an older patient. Alcohol can increase drowsiness and dizziness. Avoid alcoholic drinks. If you are taking this medicine for bipolar disorder, it is important to report any changes in your mood to your doctor or health care professional. If your condition gets worse, you get mentally depressed, feel very hyperactive or manic, have difficulty sleeping, or have thoughts of hurting yourself or committing suicide, you need to get help from your health care professional right away. If you are a caregiver for someone taking this medicine for bipolar disorder, you should also report these behavioral changes right away. The use of this medicine may increase the chance of suicidal thoughts or actions. Pay special attention to how you are responding while on this medicine. Your mouth may get dry. Chewing sugarless gum or sucking hard candy, and drinking plenty of water may help. Contact your doctor if the problem does not go away or is severe. Women who become pregnant while using this medicine may enroll in the [place] Antiepileptic Drug Pregnancy Registry by calling 1-888-233-2334. This registry collects information about the safety of antiepileptic drug use during pregnancy. What side effects may I notice from receiving this medicine? Side effects that you should report to your doctor or health care professional as soon as possible: -allergic reactions like skin rash, itching or hives, swelling of the face, lips, or tongue -blurred or double vision -difficulty walking or controlling muscle movements -fever -headache, stiff neck, and sensitivity to light -painful sores in the mouth, eyes, or nose -redness, blistering, peeling or loosening of the skin, including inside the mouth -severe muscle pain -swollen lymph glands -uncontrollable eye movements -unusual bruising or bleeding -unusually weak or  tired -vomiting -worsening of mood, thoughts or actions of suicide or dying -yellowing of the eyes or skin Side effects that usually do not require medical attention (report to your doctor or health care professional if they continue or are bothersome): -diarrhea or constipation -difficulty sleeping -nausea -tremors This list may not describe all possible side effects. Call your doctor for medical advice about side effects. You may report side effects to FDA at 1-800-FDA-1088. Where should I keep my medicine? Keep out of reach of children. Store at room temperature between 15 and 30 degrees C (59 and 86 degrees F). Throw away any unused medicine after the expiration date. NOTE: This sheet is a summary. It may not cover all possible information. If you have questions about this medicine, talk to your doctor, pharmacist, or health care provider.  2018 Elsevier/Gold Standard (2015-11-05 09:29:40)  

## 2017-11-02 NOTE — Progress Notes (Signed)
[facility] MD OP Progress Note  11/02/2017 12:41 PM Danny MathJoseph Kiker Jr.  MRN:  [id number]  Chief Complaint: ' I still have mood sx."  Chief Complaint    Follow-up; Medication Refill     HPI: Danny Mcgrath is a 72 year old divorced Caucasian male, lives in [place] by himself, is on SSI, has a history of bipolar disorder, cognitive disorder, presented to the clinic today for a follow-up visit.  Danny Mcgrath used to follow up with Dr. Garnetta BuddyFaheem and his last appointment was on 05/22/2017.  This is his first visit with Clinical research associatewriter.  Danny Mcgrath today continues to report some mood lability.  He also reports some impulsivity, spending sprees starting up multiple projects and not completing it and so on.  His thought process seemed to be circumstantial and he needed redirection several times during the evaluation.  Does report a history of perceptual disturbances but he was not clear about when was the last time he had hallucinations.  He reports a history of visual hallucinations and seeing things that are not there.  He reports his sleep as okay.  He reports he continues to take his medications.  He is on Lexapro as well as Seroquel.  He reports seroquel is helpful with his sleep.  He does have a history of cognitive disorder.  On evaluation today he seemed to be alert, oriented x3.  He was able to give information about his medication as well as personal information.  He also was able to draw a clock well.  He reports he continues to take the Exelon 6 mg twice a day which was started by Dr. Garnetta BuddyFaheem in the past.  He reports he never had a neurology appointment/evaluation for his memory issues.  He reports he continues to struggle with some short-term memory problems.  Discussed neurology referral, which may have to be done through his PMD.  He agrees with plan.  He denies any suicidality or homicidality at this time.  His blood pressure seemed to be elevated this visit.  Discussed with him to follow-up with his primary medical  doctor for the same. Visit Diagnosis:    ICD-10-CM   1. Bipolar 2 disorder (HCC) F31.81 escitalopram (LEXAPRO) 10 MG tablet    QUEtiapine (SEROQUEL) 50 MG tablet    lamoTRIgine (LAMICTAL) 25 MG tablet  2. Cognitive disorder F09 rivastigmine (EXELON) 6 MG capsule    Past Psychiatric History: [place] psychiatric [place] in [place] record.  He used to see a psychiatrist in [place] in the past. Used to follow-up with Dr. Garnetta BuddyFaheem here in clinic .Past history of being on medications like lithium, Zoloft.  Past Medical History:  Past Medical History:  Diagnosis Date  . Bipolar disorder (HCC)   . Chicken pox   . Glaucoma   . Heart murmur   . History of blood transfusion   . Hypertension     Past Surgical History:  Procedure Laterality Date  . ADENOIDECTOMY    . CATARACT EXTRACTION W/PHACO Right 01/13/2016   Procedure: CATARACT EXTRACTION PHACO AND INTRAOCULAR LENS PLACEMENT (IOC);  Surgeon: Lockie Molahadwick Brasington, MD;  [facility];  Service: Ophthalmology;  Laterality: Right;  . CATARACT EXTRACTION W/PHACO Left 09/07/2016   Procedure: CATARACT EXTRACTION PHACO AND INTRAOCULAR LENS PLACEMENT (IOC);  Surgeon: Lockie Molahadwick Brasington, MD;  Location[facility];  Service: Ophthalmology;  Laterality: Left;  PT WOULD LIKE LATER APPT  . CHOLECYSTECTOMY    . EYE SURGERY    . PENILE PROSTHESIS IMPLANT    . 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  Family History:  Family History  Problem Relation Age of Onset  . Arthritis Mother   . Cancer Mother        lung   . Hypertension Mother   . Anxiety disorder Mother   . Depression Mother   . Arthritis Father   . Cancer Father        lung and prostate  . Hypertension Father   . Diabetes Father   . Diabetes Sister   . Leukemia Sister     Social History: She was married x3.  He is divorced with his third wife  .  He is retired.  He is on SSI.  He reports he does not have any children. Social History   Socioeconomic History  . Marital status: Single    Spouse name: None  . Number of children: None  . Years of education: None  . Highest education level: None  Social Needs  . Financial resource strain: None  . Food insecurity - worry: None  . Food insecurity - inability: None  . Transportation needs - medical: None  . Transportation needs - non-medical: None  Occupational History  . None  Tobacco Use  . Smoking status: Never Smoker  . Smokeless tobacco: Never Used  Substance and Sexual Activity  . Alcohol use: No    Alcohol/week: 0.0 oz  . Drug use: No  . Sexual activity: Not Currently  Other Topics Concern  . None  Social History Narrative  . None    Allergies: No Known Allergies  Metabolic Disorder Labs: Lab Results  Component Value Date   HGBA1C 5.6 05/23/2017   No results found for: PROLACTIN Lab Results  Component Value Date   CHOL 110 05/23/2017   TRIG 66.0 05/23/2017   HDL 42.90 05/23/2017   CHOLHDL 3 05/23/2017   VLDL 13.2 05/23/2017   LDLCALC 54 05/23/2017   LDLCALC 52 05/18/2016   No results found for: TSH  Therapeutic Level Labs: No results found for: LITHIUM No results found for: VALPROATE No components found for:  CBMZ  Current Medications: Current Outpatient Medications  Medication Sig Dispense Refill  . amLODipine (NORVASC) 10 MG tablet Take 1 tablet (10 mg total) by mouth daily. 90 tablet 3  . aspirin EC 81 MG tablet Take 1 tablet (81 mg total) by mouth daily. 90 tablet 3  . atorvastatin (LIPITOR) 40 MG tablet TAKE 1 TABLET DAILY 90 tablet 3  . escitalopram (LEXAPRO) 10 MG tablet Take 1 tablet (10 mg total) by mouth daily. 90 tablet 0  . ferrous sulfate 325 (65 FE) MG tablet Take 1 tablet (325 mg total) by mouth 2 (two) times daily. 60 tablet 3  . QUEtiapine (SEROQUEL) 50 MG tablet Take 1 tablet (50 mg total) by mouth at bedtime. 90 tablet 0  .  rivastigmine (EXELON) 6 MG capsule Take 1 capsule (6 mg total) by mouth 2 (two) times daily. 180 capsule 0  . lamoTRIgine (LAMICTAL) 25 MG tablet Take 1 tablet (25 mg total) by mouth every evening. 30 tablet 1   No current facility-administered medications for this visit.      Musculoskeletal: Strength & Muscle Tone: within normal limits Gait & Station: uses a cane to walk Patient leans: N/A  Psychiatric Specialty Exam: Review of Systems  Psychiatric/Behavioral: Positive for memory loss (has forgetfulness). The patient is nervous/anxious.   All  other systems reviewed and are negative.   Blood pressure (!) 154/84, pulse 73, temperature 97.7 F (36.5 C), temperature source Oral, weight 205 lb 6.4 oz (93.2 kg).Body mass index is 29.47 kg/m.  General Appearance: Casual  Eye Contact:  Fair  Speech:  Clear and Coherent  Volume:  Normal  Mood:  Dysphoric  Affect:  Congruent  Thought Process:  Goal Directed and Descriptions of Associations: Intact  Orientation:  Full (Time, Place, and Person)  Thought Content: Logical   Suicidal Thoughts:  No  Homicidal Thoughts:  No  Memory:  Immediate;   Fair Recent;   Fair Remote;   Fair  Judgement:  Fair  Insight:  Fair  Psychomotor Activity:  Normal  Concentration:  Concentration: Fair and Attention Span: Fair  Recall:  [employer] of Knowledge: Fair  Language: Fair  Akathisia:  No  Handed:  Right  AIMS (if indicated): denies tremors , rigidity = 0  Assets:  Communication Skills Desire for Improvement Housing  ADL's:  Intact  Cognition: Impaired,  Mild  Sleep:  Fair   Screenings: Mini-Mental     Office Visit from 11/18/2016 in [facility]  Total Score (max 30 points )  30    PHQ2-9     Office Visit from 11/18/2016 in [facility]  PHQ-2 Total Score  0       Assessment and Plan: Samuel is a 72 year old Caucasian male who has a history of bipolar disorder type II, cognitive disorder,  presented to the clinic today for a follow-up visit.  Patient used to follow up with Dr. Garnetta Buddy and his last appointment was on 05/22/2017.  This is his first visit with Clinical research associate.  Based on review of medical records he is currently on Seroquel and Lexapro.  He is also on Exelon 6 mg twice a day for memory problems.  Discussed adding a mood stabilizer to help with his current mood lability/impulsivity.  Also discussed referral to neurology for his memory changes.  He agrees with plan.  Plan as noted below.  Plan  Bipolar type II Continue Seroquel 50 mg p.o. Nightly Continue Lexapro 10 mg p.o. daily Add lamotrigine 25 mg p.o. every afternoon AIMS - 0   For cognitive disorder Will make a referral to neurology. Continue Exelon 6 mg p.o. twice daily, initiated by Dr. Garnetta Buddy.  Will provide him with prescription today.  Provided medication education, provided handouts.  Follow-up in clinic in 2 months or sooner.  More than 50 % of the time was spent for psychoeducation and supportive psychotherapy and care coordination.  This note was generated in part or whole with voice recognition software. Voice recognition is usually quite accurate but there are transcription errors that can and very often do occur. I apologize for any typographical errors that were not detected and corrected.       Danny Longs, MD 11/03/2017, 12:41 PM

## 2017-11-03 ENCOUNTER — Encounter: Payer: Self-pay | Admitting: Psychiatry

## 2017-11-21 ENCOUNTER — Other Ambulatory Visit (INDEPENDENT_AMBULATORY_CARE_PROVIDER_SITE_OTHER): Payer: Medicare Other

## 2017-11-21 ENCOUNTER — Ambulatory Visit (INDEPENDENT_AMBULATORY_CARE_PROVIDER_SITE_OTHER): Payer: Medicare Other

## 2017-11-21 ENCOUNTER — Ambulatory Visit: Payer: Medicare Other

## 2017-11-21 VITALS — BP 120/80 | HR 68 | Temp 98.4°F | Resp 15 | Ht 69.0 in | Wt 205.1 lb

## 2017-11-21 DIAGNOSIS — E611 Iron deficiency: Secondary | ICD-10-CM

## 2017-11-21 DIAGNOSIS — Z23 Encounter for immunization: Secondary | ICD-10-CM

## 2017-11-21 DIAGNOSIS — Z0000 Encounter for general adult medical examination without abnormal findings: Secondary | ICD-10-CM

## 2017-11-21 NOTE — Patient Instructions (Addendum)
  [name] , Thank you for taking time to come for your Medicare Wellness Visit. I appreciate your ongoing commitment to your health goals. Please review the following plan we discussed and let me know if I can assist you in the future.   Follow up with Dr. Birdie SonsSonnenberg as needed.    Have a great day!  These are the goals we discussed: Goals    . Increase physical activity     Silver sneaker program water aerobic exercises or classroom exercises       This is a list of the screening recommended for you and due dates:  Health Maintenance  Topic Date Due  . Cologuard (Stool DNA test)  12/28/2019  . Tetanus Vaccine  11/18/2025  . Flu Shot  Completed  .  Hepatitis C: One time screening is recommended by Center for Disease Control  (CDC) for  adults born from [id number] through 1967.   Completed  . Pneumonia vaccines  Completed

## 2017-11-21 NOTE — Progress Notes (Signed)
Subjective:   Danny MathJoseph Berrie Jr. is a 72 y.o. male who presents for Medicare Annual/Subsequent preventive examination.  Review of Systems:  No ROS.  Medicare Wellness Visit. Additional risk factors are reflected in the social history. Cardiac Risk Factors include: advanced age (>155men, 10>65 women);male gender;hypertension     Objective:    Vitals: BP 120/80 (BP Location: Left Arm, Patient Position: Sitting, Cuff Size: Normal)   Pulse 68   Temp 98.4 F (36.9 C) (Oral)   Resp 15   Ht 5\' 9"  (1.753 m)   Wt 205 lb 1.9 oz (93 kg)   SpO2 96%   BMI 30.29 kg/m   Body mass index is 30.29 kg/m.  Advanced Directives 11/21/2017 11/28/2016 11/21/2016 11/18/2016 09/07/2016 01/13/2016  Does Patient Have a Medical Advance Directive? No No No No Yes No  Type of Advance Directive - - - - Therapist, artHealthcare Power of Attorney -  Copy of Healthcare Power of Attorney in Chart? - - - - No - copy requested -  Would patient like information on creating a medical advance directive? No - Patient declined - - No - Patient declined - No - patient declined information  Some encounter information is confidential and restricted. Go to Review Flowsheets activity to see all data.    Tobacco Social History   Tobacco Use  Smoking Status Never Smoker  Smokeless Tobacco Never Used     Counseling given: Not Answered   Clinical Intake:  Pre-visit preparation completed: Yes  Pain : No/denies pain     Nutritional Status: BMI 25 -29 Overweight Diabetes: No  How often do you need to have someone help you when you read instructions, pamphlets, or other written materials from your doctor or pharmacy?: 1 - Never  Interpreter Needed?: No     Past Medical History:  Diagnosis Date  . Bipolar disorder (HCC)   . Chicken pox   . Glaucoma   . Heart murmur   . History of blood transfusion   . Hypertension    Past Surgical History:  Procedure Laterality Date  . ADENOIDECTOMY    . CATARACT EXTRACTION W/PHACO Right  01/13/2016   Procedure: CATARACT EXTRACTION PHACO AND INTRAOCULAR LENS PLACEMENT (IOC);  Surgeon: Lockie Molahadwick Brasington, MD;  [facility];  Service: Ophthalmology;  Laterality: Right;  . CATARACT EXTRACTION W/PHACO Left 09/07/2016   Procedure: CATARACT EXTRACTION PHACO AND INTRAOCULAR LENS PLACEMENT (IOC);  Surgeon: Lockie Molahadwick Brasington, MD;  [facility];  Service: Ophthalmology;  Laterality: Left;  PT WOULD LIKE LATER APPT  . CHOLECYSTECTOMY    . EYE SURGERY    . PENILE PROSTHESIS IMPLANT    . 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 Arthritis Mother   . Cancer Mother        lung   . Hypertension Mother   . Anxiety disorder Mother   . Depression Mother   . Arthritis Father   . Cancer Father        lung and prostate  . Hypertension Father   . Diabetes Father   . Diabetes Sister   . Leukemia Sister    Social History   Socioeconomic History  . Marital status: Single    Spouse name: None  . Number of children: None  . Years of education: None  . Highest education level: None  Social Needs  . Financial resource strain: None  .  Food insecurity - worry: None  . Food insecurity - inability: None  . Transportation needs - medical: No  . Transportation needs - non-medical: No  Occupational History  . None  Tobacco Use  . Smoking status: Never Smoker  . Smokeless tobacco: Never Used  Substance and Sexual Activity  . Alcohol use: No    Alcohol/week: 0.0 oz  . Drug use: No  . Sexual activity: Not Currently  Other Topics Concern  . None  Social History Narrative  . None    Outpatient Encounter Medications as of 11/21/2017  Medication Sig  . amLODipine (NORVASC) 10 MG tablet TAKE 1 TABLET DAILY  . aspirin EC 81 MG tablet Take 1 tablet (81 mg total) by mouth daily.  Marland Kitchen atorvastatin (LIPITOR) 40 MG tablet TAKE 1 TABLET DAILY  . escitalopram  (LEXAPRO) 10 MG tablet Take 1 tablet (10 mg total) by mouth daily.  . ferrous sulfate 325 (65 FE) MG tablet Take 1 tablet (325 mg total) by mouth 2 (two) times daily.  Marland Kitchen lamoTRIgine (LAMICTAL) 25 MG tablet Take 1 tablet (25 mg total) by mouth every evening.  Marland Kitchen QUEtiapine (SEROQUEL) 50 MG tablet Take 1 tablet (50 mg total) by mouth at bedtime.  . rivastigmine (EXELON) 6 MG capsule Take 1 capsule (6 mg total) by mouth 2 (two) times daily.   No facility-administered encounter medications on file as of 11/21/2017.     Activities of Daily Living In your present state of health, do you have any difficulty performing the following activities: 11/21/2017  Hearing? N  Vision? N  Difficulty concentrating or making decisions? N  Walking or climbing stairs? Y  Dressing or bathing? N  Doing errands, shopping? N  Preparing Food and eating ? N  Using the Toilet? N  In the past six months, have you accidently leaked urine? N  Do you have problems with loss of bowel control? N  Managing your Medications? N  Managing your Finances? N  Housekeeping or managing your Housekeeping? N  Some recent data might be hidden    Patient Care Team: Glori Luis, MD as PCP - General (Family Medicine)   Assessment:   This is a routine wellness examination for Danny Mcgrath. The goal of the wellness visit is to assist the patient how to close the gaps in care and create a preventative care plan for the patient.   The roster of all physicians providing medical care to patient is listed in the Snapshot section of the chart.  Osteoporosis risk reviewed.    Safety issues reviewed; Smoke detectors in the home. Firearms locked up in the home. Wears seatbelts when driving or riding with others. Patient does wear sunscreen or protective clothing when in direct sunlight. No violence in the home.  Patient is alert, normal appearance, oriented to person/place/and time. Correctly identified the president of the [place], recall of  3/3 words, and performing simple calculations. Displays appropriate judgement and can read correct time from watch face.   No new identified risk were noted.  No failures at ADL's or IADL's.  Ambulates with cane as needed.  BMI- discussed the importance of a healthy diet, water intake and the benefits of aerobic exercise. Educational material provided.   24 hour diet recall: Breakfast: Cereal, fruit Lunch: Salad Dinner: Beef stew  Daily fluid intake: 2 cups of caffeine, 4 cups of water  Eye- Visual acuity not assessed per patient preference since they have regular follow up with the ophthalmologist.  Wears corrective lenses.  Sleep patterns- Sleeps 6 hours at night.  Wakes feeling rested.  Pneumovax 23 administered L deltoid, tolerated well. Educational material provided.  BP reading 120/80 L arm.  Improved from last visit.   Home BP readings placed in PCP box.  Labs completed.  Health maintenance gaps- closed.  Patient Concerns: None at this time. Follow up with PCP as needed.  Exercise Activities and Dietary recommendations Current Exercise Habits: Home exercise routine, Type of exercise: strength training/weights;walking, Time (Minutes): 60, Frequency (Times/Week): 2, Weekly Exercise (Minutes/Week): 120, Intensity: Mild  Goals    . Increase physical activity     Silver sneaker program water aerobic exercises or classroom exercises       Fall Risk Fall Risk  11/21/2017 11/18/2016  Falls in the past year? Yes Yes  Number falls in past yr: 2 or more 2 or more  Injury with Fall? No No  Risk Factor Category  High Fall Risk -  Risk for fall due to : History of fall(s) -  Follow up Falls prevention discussed;Education provided -   Depression Screen PHQ 2/9 Scores 11/21/2017 11/18/2016  PHQ - 2 Score 0 0    Cognitive Function MMSE - Mini Mental State Exam 11/18/2016  Orientation to time 5  Orientation to Place 5  Registration 3  Attention/ Calculation 5  Recall 3    Language- name 2 objects 2  Language- repeat 1  Language- follow 3 step command 3  Language- read & follow direction 1  Write a sentence 1  Copy design 1  Total score 30     6CIT Screen 11/21/2017  What Year? 0 points  What month? 0 points  What time? 0 points  Count back from 20 0 points  Months in reverse 0 points  Repeat phrase 0 points  Total Score 0    Immunization History  Administered Date(s) Administered  . Influenza, High Dose Seasonal PF 11/18/2016, 08/04/2017  . Influenza,inj,Quad PF,6+ Mos 07/23/2015  . Pneumococcal Conjugate-13 11/19/2015  . Pneumococcal Polysaccharide-23 11/21/2017  . Tdap 11/19/2015    Screening Tests Health Maintenance  Topic Date Due  . Fecal DNA (Cologuard)  12/28/2019  . TETANUS/TDAP  11/18/2025  . INFLUENZA VACCINE  Completed  . Hepatitis C Screening  Completed  . PNA vac Low Risk Adult  Completed      Plan:   End of life planning; Advanced aging; Advanced directives discussed.  No HCPOA/Living Will.  Additional information declined at this time.  I have personally reviewed and noted the following in the patient's chart:   . Medical and social history . Use of alcohol, tobacco or illicit drugs  . Current medications and supplements . Functional ability and status . Nutritional status . Physical activity . Advanced directives . List of other physicians . Hospitalizations, surgeries, and ER visits in previous 12 months . Vitals . Screenings to include cognitive, depression, and falls . Referrals and appointments  In addition, I have reviewed and discussed with patient certain preventive protocols, quality metrics, and best practice recommendations. A written personalized care plan for preventive services as well as general preventive health recommendations were provided to patient.     Ashok Pall, LPN  10/22/1094

## 2017-11-22 LAB — IRON,TIBC AND FERRITIN PANEL
%SAT: 18 % (calc) (ref 15–60)
Ferritin: 27 ng/mL (ref 20–380)
IRON: 66 ug/dL (ref 50–180)
TIBC: 366 ug/dL (ref 250–425)

## 2017-12-25 ENCOUNTER — Other Ambulatory Visit: Payer: Self-pay

## 2017-12-25 ENCOUNTER — Encounter: Payer: Self-pay | Admitting: Psychiatry

## 2017-12-25 ENCOUNTER — Telehealth: Payer: Self-pay

## 2017-12-25 ENCOUNTER — Ambulatory Visit: Payer: Medicare Other | Admitting: Psychiatry

## 2017-12-25 DIAGNOSIS — F09 Unspecified mental disorder due to known physiological condition: Secondary | ICD-10-CM | POA: Diagnosis not present

## 2017-12-25 DIAGNOSIS — F3181 Bipolar II disorder: Secondary | ICD-10-CM | POA: Diagnosis not present

## 2017-12-25 MED ORDER — RIVASTIGMINE TARTRATE 6 MG PO CAPS: 6.0000 mg | ORAL_CAPSULE | Freq: Two times a day (BID) | ORAL | 0 refills | Status: DC

## 2017-12-25 MED ORDER — LAMOTRIGINE 25 MG PO TABS: 25.0000 mg | ORAL_TABLET | Freq: Every evening | ORAL | 0 refills | Status: DC

## 2017-12-25 MED ORDER — ESCITALOPRAM OXALATE 10 MG PO TABS: 10.0000 mg | ORAL_TABLET | Freq: Every day | ORAL | 0 refills | Status: DC

## 2017-12-25 MED ORDER — QUETIAPINE FUMARATE 50 MG PO TABS: 50.0000 mg | ORAL_TABLET | Freq: Every day | ORAL | 0 refills | Status: DC

## 2017-12-25 NOTE — Telephone Encounter (Signed)
thank you !!

## 2017-12-25 NOTE — Progress Notes (Signed)
[facility] MD OP Progress Note  12/25/2017 3:00 PM Danny Mcgrath.  MRN:  [id number]  Chief Complaint: ' I am ok."  Chief Complaint    Follow-up; Medication Refill     HPI: Danny Mcgrath is a [age] y old divorced Caucasian male, lives in [place] by himself, is on SSI, has a history of bipolar disorder, cognitive disorder, presented to the clinic today for a follow-up visit.  Danny Mcgrath used to follow up with Dr. Garnetta BuddyFaheem in the past here in this clinic.    This is his second appointment with Clinical research associatewriter.  Pt carries a diagnosis of bipolar disorder as well as cognitive disorder.  Danny Mcgrath is on medications like Lexapro, Seroquel, Exelon which  were prescribed by Dr. Garnetta BuddyFaheem in the past.  Patient was started most recently on lamotrigine by writer during his last visit for his mood lability.  Patient today reports his mood symptoms are stable.  He denies any spending sprees or impulsivity or manic symptoms at this time.  He reports sleep is good.  He denies any suicidality.  He denies any perceptual disturbances.  He  is alert and oriented today.  He denies any problems with his memory.  He reports he is able to complete his chores at home without being forgetful.  He reports he is able to drive without getting lost.  He presented to the clinic today by himself and he lives by himself.  He reports he has no trouble managing his finances either.  He was able to complete an MMSE in office today.  He scored 30 out of 30 on the same.  He was started on Exelon in the past by Dr. Garnetta BuddyFaheem for memory issues.  Discussed with him that we can refer him to neurology to give us a consult on managing the same.   Visit Diagnosis:    ICD-10-CM   1. Bipolar 2 disorder (HCC) F31.81 escitalopram (LEXAPRO) 10 MG tablet    lamoTRIgine (LAMICTAL) 25 MG tablet    QUEtiapine (SEROQUEL) 50 MG tablet  2. Cognitive disorder F09 rivastigmine (EXELON) 6 MG capsule    Past Psychiatric History: [place] psychiatric [place] in [place] record.   He used to see a psychiatrist in [place] in the past.  Used to follow-up with Dr. Garnetta BuddyFaheem here in clinic.  Past history of being on medications like lithium, Zoloft.  Past Medical History:  Past Medical History:  Diagnosis Date  . Bipolar disorder (HCC)   . Chicken pox   . Glaucoma   . Heart murmur   . History of blood transfusion   . Hypertension     Past Surgical History:  Procedure Laterality Date  . ADENOIDECTOMY    . CATARACT EXTRACTION W/PHACO Right 01/13/2016   Procedure: CATARACT EXTRACTION PHACO AND INTRAOCULAR LENS PLACEMENT (IOC);  Surgeon: Lockie Molahadwick Brasington, MD;  [facility];  Service: Ophthalmology;  Laterality: Right;  . CATARACT EXTRACTION W/PHACO Left 09/07/2016   Procedure: CATARACT EXTRACTION PHACO AND INTRAOCULAR LENS PLACEMENT (IOC);  Surgeon: Lockie Molahadwick Brasington, MD;  [facility];  Service: Ophthalmology;  Laterality: Left;  PT WOULD LIKE LATER APPT  . CHOLECYSTECTOMY    . EYE SURGERY    . PENILE PROSTHESIS IMPLANT    . 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  Family History:  Family History  Problem Relation Age of Onset  . Arthritis Mother   .  Cancer Mother        lung   . Hypertension Mother   . Anxiety disorder Mother   . Depression Mother   . Arthritis Father   . Cancer Father        lung and prostate  . Hypertension Father   . Diabetes Father   . Diabetes Sister   . Leukemia Sister    Substance abuse history: Denies  Social History: He was married x3.  He is divorced with his third wife.  He is retired.  He is on SSI.  He reports he does not have any children. Social History   Socioeconomic History  . Marital status: Single    Spouse name: None  . Number of children: None  . Years of education: None  . Highest education level: None  Social Needs  . Financial resource strain: None  . Food insecurity  - worry: None  . Food insecurity - inability: None  . Transportation needs - medical: No  . Transportation needs - non-medical: No  Occupational History  . None  Tobacco Use  . Smoking status: Never Smoker  . Smokeless tobacco: Never Used  Substance and Sexual Activity  . Alcohol use: No    Alcohol/week: 0.0 oz  . Drug use: No  . Sexual activity: Not Currently  Other Topics Concern  . None  Social History Narrative  . None    Allergies: No Known Allergies  Metabolic Disorder Labs: Lab Results  Component Value Date   HGBA1C 5.6 05/23/2017   No results found for: PROLACTIN Lab Results  Component Value Date   CHOL 110 05/23/2017   TRIG 66.0 05/23/2017   HDL 42.90 05/23/2017   CHOLHDL 3 05/23/2017   VLDL 13.2 05/23/2017   LDLCALC 54 05/23/2017   LDLCALC 52 05/18/2016   No results found for: TSH  Therapeutic Level Labs: No results found for: LITHIUM No results found for: VALPROATE No components found for:  CBMZ  Current Medications: Current Outpatient Medications  Medication Sig Dispense Refill  . amLODipine (NORVASC) 10 MG tablet TAKE 1 TABLET DAILY 90 tablet 3  . aspirin EC 81 MG tablet Take 1 tablet (81 mg total) by mouth daily. 90 tablet 3  . atorvastatin (LIPITOR) 40 MG tablet TAKE 1 TABLET DAILY 90 tablet 3  . escitalopram (LEXAPRO) 10 MG tablet Take 1 tablet (10 mg total) by mouth daily. 90 tablet 0  . ferrous sulfate 325 (65 FE) MG tablet Take 1 tablet (325 mg total) by mouth 2 (two) times daily. 60 tablet 3  . lamoTRIgine (LAMICTAL) 25 MG tablet Take 1 tablet (25 mg total) by mouth every evening. 90 tablet 0  . QUEtiapine (SEROQUEL) 50 MG tablet Take 1 tablet (50 mg total) by mouth at bedtime. 90 tablet 0  . rivastigmine (EXELON) 6 MG capsule Take 1 capsule (6 mg total) by mouth 2 (two) times daily. 180 capsule 0   No current facility-administered medications for this visit.      Musculoskeletal: Strength & Muscle Tone: within normal limits Gait &  Station: normal Patient leans: N/A  Psychiatric Specialty Exam: Review of Systems  Psychiatric/Behavioral: The patient is nervous/anxious (improved).   All other systems reviewed and are negative.   Blood pressure 137/84, pulse 71, temperature 98.7 F (37.1 C), temperature source Oral, weight 203 lb 6.4 oz (92.3 kg).Body mass index is 30.04 kg/m.  General Appearance: Casual  Eye Contact:  Fair  Speech:  Normal Rate  Volume:  Normal  Mood:  Euthymic  Affect:  Congruent  Thought Process:  Goal Directed and Descriptions of Associations: Intact  Orientation:  Full (Time, Place, and Person)  Thought Content: Logical   Suicidal Thoughts:  No  Homicidal Thoughts:  No  Memory:  Immediate;   Fair Recent;   Fair Remote;   Fair  Judgement:  Fair  Insight:  Fair  Psychomotor Activity:  Normal  Concentration:  Concentration: Fair and Attention Span: Fair  Recall:  [employer] of Knowledge: Fair  Language: Fair  Akathisia:  No  Handed:  Right  AIMS (if indicated): 0  Assets:  Communication Skills Desire for Improvement Housing Transportation  ADL's:  Intact  Cognition: WNL  Sleep:  Fair   Screenings: Mini-Mental     [facility]/18/[facility]  Total Score (max 30 points )  30    PHQ2-9     Clinical Support from 11/21/2017 in [facility] Office Visit from 11/18/2016 in [facility]   PHQ-2 Total Score  0  0       Assessment and Plan: Sabino is a 72 year old Caucasian male who has a history of bipolar disorder type II, cognitive disorder, presented to the clinic today for a follow-up visit.  Patient used to follow up with Dr. Garnetta Buddy in the past.  Patient is currently on Seroquel Lexapro, Exelon as well as lamotrigine.  He is currently doing well on the current combination of medication.  He was started on Exelon in  the past by his previous psychiatrist.  An MMSE done today and he scored 30 out of 30.   Discussed neurology referral for a consult regarding his medications and his cognitive issues.  Patient agrees with plan.  Plan Bipolar type II Continue Seroquel 50 mg p.o. nightly Continue Lexapro 10 mg p.o. daily Lamotrigine 25 mg p.o. every afternoon Aims equals 0.   Cognitive disorder MMSE = 30 out of 30 today Discussed with Shanda Bumps CMA to make a referral to neurology for a consult regarding his medication-Exelon prescribed by his previous provider as well as cognitive issues.  Discussed labs like TSH, vitamin B12, folate-patient reports his primary medical doctor is working on the same at this time.  Provided medication education, provided handouts.  Follow-up in clinic in 3 months or sooner if needed.  More than 50 % of the time was spent for psychoeducation and supportive psychotherapy and care coordination.  This note was generated in part or whole with voice recognition software. Voice recognition is usually quite accurate but there are transcription errors that can and very often do occur. I apologize for any typographical errors that were not detected and corrected.      Danny Longs, MD 12/25/2017, 3:00 PM

## 2017-12-25 NOTE — Telephone Encounter (Signed)
fax[facility]- (last office note, demo sheet, and copy of insurance card) pending appt date.

## 2018-01-20 ENCOUNTER — Other Ambulatory Visit: Payer: Self-pay | Admitting: Psychiatry

## 2018-01-20 DIAGNOSIS — F09 Unspecified mental disorder due to known physiological condition: Secondary | ICD-10-CM

## 2018-03-15 ENCOUNTER — Other Ambulatory Visit: Payer: Self-pay | Admitting: Nurse Practitioner

## 2018-03-15 DIAGNOSIS — R413 Other amnesia: Secondary | ICD-10-CM

## 2018-03-23 ENCOUNTER — Other Ambulatory Visit: Payer: Self-pay | Admitting: Neurology

## 2018-03-23 DIAGNOSIS — R413 Other amnesia: Secondary | ICD-10-CM

## 2018-03-27 ENCOUNTER — Ambulatory Visit (INDEPENDENT_AMBULATORY_CARE_PROVIDER_SITE_OTHER): Payer: Medicare Other | Admitting: Psychiatry

## 2018-03-27 ENCOUNTER — Other Ambulatory Visit: Payer: Self-pay

## 2018-03-27 ENCOUNTER — Encounter: Payer: Self-pay | Admitting: Psychiatry

## 2018-03-27 VITALS — BP 129/70 | HR 71 | Temp 99.3°F | Wt 203.2 lb

## 2018-03-27 DIAGNOSIS — F3181 Bipolar II disorder: Secondary | ICD-10-CM

## 2018-03-27 DIAGNOSIS — F09 Unspecified mental disorder due to known physiological condition: Secondary | ICD-10-CM

## 2018-03-27 MED ORDER — RIVASTIGMINE TARTRATE 6 MG PO CAPS: 6.0000 mg | ORAL_CAPSULE | Freq: Two times a day (BID) | ORAL | 1 refills | Status: DC

## 2018-03-27 MED ORDER — ESCITALOPRAM OXALATE 10 MG PO TABS: 10.0000 mg | ORAL_TABLET | Freq: Every day | ORAL | 1 refills | Status: DC

## 2018-03-27 MED ORDER — QUETIAPINE FUMARATE 50 MG PO TABS: 50.0000 mg | ORAL_TABLET | Freq: Every day | ORAL | 1 refills | Status: DC

## 2018-03-27 MED ORDER — LAMOTRIGINE 25 MG PO TABS: 25.0000 mg | ORAL_TABLET | Freq: Every evening | ORAL | 1 refills | Status: DC

## 2018-03-27 NOTE — Progress Notes (Signed)
BH MD OP Progress Note  03/27/2018 3:13 PM [name]  MRN:  [id number]  Chief Complaint: ' I am here for follow up.' Chief Complaint    Follow-up; Medication Refill     HPI: Danny Mcgrath is a 72 year old divorced Caucasian male, lives in [place] by himself, is on SSI, has a history of bipolar disorder, cognitive disorder, presented to the clinic today for a follow-up visit.  Patient used to follow up with Dr. Garnetta Buddy in the past.  Patient today reports he is currently doing well on the current combination of medication.  Patient reports his mood symptoms as stable.  He tolerated the Lamictal well.  He denies any side effects.  He reports sleep is good.  He continues to report his memory as okay.  He has no trouble paying his bills, cooking or driving.  He continues to be on Exelon-initiated by Dr. Garnetta Buddy.  He reports he has started taking up some projects around the house.  He is building a flower bed in front of his house. He also goes out for walks at least 3-4 times a week.  Denies any suicidality.  He denies any perceptual disturbances.  He was referred to a neurologist during his last visit for his cognitive issues .  Visit Diagnosis:    ICD-10-CM   1. Bipolar 2 disorder (HCC) F31.81 lamoTRIgine (LAMICTAL) 25 MG tablet    escitalopram (LEXAPRO) 10 MG tablet    QUEtiapine (SEROQUEL) 50 MG tablet   in early remission  2. Cognitive disorder F09 rivastigmine (EXELON) 6 MG capsule   unspecified    Past Psychiatric History: I have reviewed past psychiatric history from my progress note on 12/25/2017.  Past Medical History:  Past Medical History:  Diagnosis Date  . Bipolar disorder (HCC)   . Chicken pox   . Glaucoma   . Heart murmur   . History of blood transfusion   . Hypertension     Past Surgical History:  Procedure Laterality Date  . ADENOIDECTOMY    . CATARACT EXTRACTION W/PHACO Right 01/13/2016   Procedure: CATARACT EXTRACTION PHACO AND INTRAOCULAR LENS  PLACEMENT (IOC);  Surgeon: Lockie Mola, MD;  Location: [facility];  Service: Ophthalmology;  Laterality: Right;  . CATARACT EXTRACTION W/PHACO Left 09/07/2016   Procedure: CATARACT EXTRACTION PHACO AND INTRAOCULAR LENS PLACEMENT (IOC);  Surgeon: Lockie Mola, MD;  Location: [facility];  Service: Ophthalmology;  Laterality: Left;  PT WOULD LIKE LATER APPT  . CHOLECYSTECTOMY    . EYE SURGERY    . PENILE PROSTHESIS IMPLANT    . 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  Family History:  Family History  Problem Relation Age of Onset  . Arthritis Mother   . Cancer Mother        lung   . Hypertension Mother   . Anxiety disorder Mother   . Depression Mother   . Arthritis Father   . Cancer Father        lung and prostate  . Hypertension Father   . Diabetes Father   . Diabetes Sister   . Leukemia Sister    Substance abuse history: Denies  Social History: He was married x3.  He is divorced with his third wife.  He is retired.  He is on SSI.  He reports he does not have any children. Social History  Socioeconomic History  . Marital status: Single    Spouse name: Not on file  . Number of children: Not on file  . Years of education: Not on file  . Highest education level: Not on file  Occupational History  . Not on file  Social Needs  . Financial resource strain: Not on file  . Food insecurity:    Worry: Not on file    Inability: Not on file  . Transportation needs:    Medical: No    Non-medical: No  Tobacco Use  . Smoking status: Never Smoker  . Smokeless tobacco: Never Used  Substance and Sexual Activity  . Alcohol use: No    Alcohol/week: 0.0 oz  . Drug use: No  . Sexual activity: Not Currently  Lifestyle  . Physical activity:    Days per week: Not on file    Minutes per session:  Not on file  . Stress: Not on file  Relationships  . Social connections:    Talks on phone: Not on file    Gets together: Not on file    Attends religious service: Not on file    Active member of club or organization: Not on file    Attends meetings of clubs or organizations: Not on file    Relationship status: Not on file  Other Topics Concern  . Not on file  Social History Narrative  . Not on file    Allergies: No Known Allergies  Metabolic Disorder Labs: Lab Results  Component Value Date   HGBA1C 5.6 05/23/2017   No results found for: PROLACTIN Lab Results  Component Value Date   CHOL 110 05/23/2017   TRIG 66.0 05/23/2017   HDL 42.90 05/23/2017   CHOLHDL 3 05/23/2017   VLDL 13.2 05/23/2017   LDLCALC 54 05/23/2017   LDLCALC 52 05/18/2016   No results found for: TSH  Therapeutic Level Labs: No results found for: LITHIUM No results found for: VALPROATE No components found for:  CBMZ  Current Medications: Current Outpatient Medications  Medication Sig Dispense Refill  . amLODipine (NORVASC) 10 MG tablet TAKE 1 TABLET DAILY 90 tablet 3  . aspirin EC 81 MG tablet Take 1 tablet (81 mg total) by mouth daily. 90 tablet 3  . atorvastatin (LIPITOR) 40 MG tablet TAKE 1 TABLET DAILY 90 tablet 3  . escitalopram (LEXAPRO) 10 MG tablet Take 1 tablet (10 mg total) by mouth daily. 90 tablet 1  . ferrous sulfate 325 (65 FE) MG tablet Take 1 tablet (325 mg total) by mouth 2 (two) times daily. 60 tablet 3  . lamoTRIgine (LAMICTAL) 25 MG tablet Take 1 tablet (25 mg total) by mouth every evening. 90 tablet 1  . QUEtiapine (SEROQUEL) 50 MG tablet Take 1 tablet (50 mg total) by mouth at bedtime. 90 tablet 1  . rivastigmine (EXELON) 6 MG capsule Take 1 capsule (6 mg total) by mouth 2 (two) times daily. 180 capsule 1  . Vitamin D, Ergocalciferol, (DRISDOL) 50000 units CAPS capsule Take by mouth.     No current facility-administered medications for this visit.       Musculoskeletal: Strength & Muscle Tone: within normal limits Gait & Station: normal Patient leans: N/A  Psychiatric Specialty Exam: Review of Systems  Psychiatric/Behavioral: The patient is nervous/anxious.   All other systems reviewed and are negative.   Blood pressure 129/70, pulse 71, temperature 99.3 F (37.4 C), temperature source Oral, weight 203 lb 3.2 oz (92.2 kg).Body mass index is 30.01 kg/m.  General Appearance: Casual  Eye Contact:  Fair  Speech:  Clear and Coherent  Volume:  Normal  Mood:  Anxious  Affect:  Congruent  Thought Process:  Goal Directed and Descriptions of Associations: Intact  Orientation:  Full (Time, Place, and Person)  Thought Content: Logical   Suicidal Thoughts:  No  Homicidal Thoughts:  No  Memory:  Immediate;   Fair Recent;   Fair Remote;   Fair  Judgement:  Fair  Insight:  Fair  Psychomotor Activity:  Normal  Concentration:  Concentration: Fair and Attention Span: Fair  Recall:  Fis[employer]Fair  Fund of Knowledge: Fair  Language: Fair  Akathisia:  No  Handed:  Right  AIMS (if indicated): 0  Assets:  Communication Skills Desire for Improvement Housing Social Support  ADL's:  Intact  Cognition: WNL  Sleep:  Fair   Screenings: Mini-Mental     [facility] Visit from 11/18/2016 in [facility]  Total Score (max 30 points )  30    PHQ2-9     Clinical Support from 11/21/2017 in [facility] Office Visit from 11/18/2016 in [facility] [place]  PHQ-2 Total Score  0  0       Assessment and Plan: Jomarie LongsJoseph is a 72 year old Caucasian male who has a history of bipolar disorder type II, cognitive disorder, presented to the clinic today for a follow-up visit.  Patient used to follow up with Dr. Garnetta BuddyFaheem in the past.  Patient currently is doing well on the current medication regimen.  He also has been on Exelon, initiated by Dr. Garnetta BuddyFaheem in the past.  Patient has also been referred to neurology for further  follow-up of his cognitive symptoms.  We will continue medications as noted below.  Plan as noted below.  Plan Bipolar type II-in early remission Continue Seroquel 50 mg p.o. Nightly Continue Lexapro 10 mg p.o. daily Lamictal 25 mg p.o. daily Aims equals 0  Cognitive disorder-unspecified MMSE-30 out of 30 on 12/25/2017 Patient has been referred to neurology, will continue to keep his appointments with neurology. Continue Exelon for now.  Provided medication education, provided handouts.  Follow-up in clinic in 3-4 months or sooner if needed.  More than 50 % of the time was spent for psychoeducation and supportive psychotherapy and care coordination.  This note was generated in part or whole with voice recognition software. Voice recognition is usually quite accurate but there are transcription errors that can and very often do occur. I apologize for any typographical errors that were not detected and corrected.        Jomarie LongsSaramma Eneida Evers, MD 03/28/2018, 8:35 AM

## 2018-03-28 ENCOUNTER — Ambulatory Visit: Payer: Medicare Other

## 2018-03-28 ENCOUNTER — Encounter: Payer: Self-pay | Admitting: Psychiatry

## 2018-04-23 ENCOUNTER — Encounter: Payer: Self-pay | Admitting: Family Medicine

## 2018-04-23 ENCOUNTER — Ambulatory Visit: Payer: Medicare Other | Admitting: Family Medicine

## 2018-04-23 VITALS — BP 120/82 | HR 62 | Temp 98.7°F | Wt 201.8 lb

## 2018-04-23 DIAGNOSIS — E785 Hyperlipidemia, unspecified: Secondary | ICD-10-CM

## 2018-04-23 DIAGNOSIS — E559 Vitamin D deficiency, unspecified: Secondary | ICD-10-CM | POA: Diagnosis not present

## 2018-04-23 DIAGNOSIS — B351 Tinea unguium: Secondary | ICD-10-CM

## 2018-04-23 DIAGNOSIS — I10 Essential (primary) hypertension: Secondary | ICD-10-CM

## 2018-04-23 DIAGNOSIS — E611 Iron deficiency: Secondary | ICD-10-CM

## 2018-04-23 DIAGNOSIS — K439 Ventral hernia without obstruction or gangrene: Secondary | ICD-10-CM | POA: Diagnosis not present

## 2018-04-23 NOTE — Assessment & Plan Note (Signed)
Suspect hernia.  Discussed hernia return precautions.  Refer to general surgery.

## 2018-04-23 NOTE — Patient Instructions (Signed)
Nice to see you. We will get you to see general surgery for your likely hernia.  We will get you to see podiatry for your toenails. We will check lab work today and contact you with the results.

## 2018-04-23 NOTE — Assessment & Plan Note (Signed)
-  Continue Lipitor °

## 2018-04-23 NOTE — Assessment & Plan Note (Signed)
Check vitamin D. 

## 2018-04-23 NOTE — Assessment & Plan Note (Signed)
At goal continue current medication  

## 2018-04-23 NOTE — Assessment & Plan Note (Signed)
Anemia previously though more recently that has resolved.  We will check labs today.  I did discuss referral to GI for endoscopic evaluation though he deferred to find out what his lab work showed.  It would be difficult for him to be appropriately evaluated given that he would not have transportation.

## 2018-04-23 NOTE — Progress Notes (Signed)
Danny Rumps, MD Phone: [phone]  Danny Mcgrath. is a 72 y.o. male who presents today for f/u.  CC: htn, hld, abdominal bulge, iron deficiency  HYPERTENSION  Disease Monitoring  Home BP Monitoring 115-120/65-67 Chest pain- no    Dyspnea- no Medications  Compliance-  Taking amlodipine.  Edema- no  HYPERLIPIDEMIA Symptoms Chest pain on exertion:  no Medications: Compliance- taking lipitor Right upper quadrant pain- no  Muscle aches- no  Patient notes for some time now has felt a bulge in his right mid abdomen that only occurs when he stands up.  He cannot feel it when he lays down.  He wonders if it is a hernia.  He notes no pain.  He has bowel movements most days.  He does pass gas.  Iron deficiency anemia: History of this.  He was unable to complete endoscopic evaluation given that he did not have transportation for the endoscopies.  He had a negative Cologuard and has had negative FOBT and urine studies.  He continues on iron supplementation once daily.  Vitamin D deficiency: He finished the vitamin D supplement needs recheck.  He notes onychomycosis particularly on his bilateral great toes.  He wonders if he should see somebody to have his toenails shaved down.    Social History   Tobacco Use  Smoking Status Never Smoker  Smokeless Tobacco Never Used     ROS see history of present illness  Objective  Physical Exam Vitals:   04/23/18 1558  BP: 120/82  Pulse: 62  Temp: 98.7 F (37.1 C)  SpO2: 95%    BP Readings from Last 3 Encounters:  04/23/18 120/82  11/21/17 120/80  10/24/17 (!) 144/84   Wt Readings from Last 3 Encounters:  04/23/18 201 lb 12.8 oz (91.5 kg)  11/21/17 205 lb 1.9 oz (93 kg)  10/24/17 202 lb 9.6 oz (91.9 kg)    Physical Exam  Constitutional: No distress.  Cardiovascular: Normal rate, regular rhythm and normal heart sounds.  Pulmonary/Chest: Effort normal and breath sounds normal.  Abdominal: Soft. Bowel sounds are  normal. He exhibits no distension. There is no tenderness.    Musculoskeletal: He exhibits no edema.  Neurological: He is alert.  Skin: Skin is warm and dry. He is not diaphoretic.  Severely thickened toenails on bilateral great toes     Assessment/Plan: Please see individual problem list.  HTN (hypertension) At goal continue current medication  Hyperlipidemia Continue Lipitor.  Iron deficiency Anemia previously though more recently that has resolved.  We will check labs today.  I did discuss referral to GI for endoscopic evaluation though he deferred to find out what his lab work showed.  It would be difficult for him to be appropriately evaluated given that he would not have transportation.  Ventral hernia without obstruction or gangrene Suspect hernia.  Discussed hernia return precautions.  Refer to general surgery.  Vitamin D deficiency Check vitamin D.  Onychomycosis Refer to podiatry   Orders Placed This Encounter  Procedures  . Comp Met (CMET)  . CBC  . Iron, TIBC and Ferritin Panel  . Vitamin D (25 hydroxy)  . Ambulatory referral to Podiatry    Referral Priority:   Routine    Referral Type:   Consultation    Referral Reason:   Specialty Services Required    Requested Specialty:   Podiatry    Number of Visits Requested:   1  . Ambulatory referral to General Surgery    Referral Priority:   Routine  Referral Type:   Surgical    Referral Reason:   Specialty Services Required    Requested Specialty:   General Surgery    Number of Visits Requested:   1    No orders of the defined types were placed in this encounter.    Danny Rumps, MD [place]

## 2018-04-23 NOTE — Assessment & Plan Note (Addendum)
Refer to podiatry

## 2018-04-24 LAB — IRON,TIBC AND FERRITIN PANEL
%SAT: 25 % (ref 20–48)
FERRITIN: 119 ng/mL (ref 24–380)
Iron: 81 ug/dL (ref 50–180)
TIBC: 319 mcg/dL (calc) (ref 250–425)

## 2018-04-24 LAB — COMPREHENSIVE METABOLIC PANEL
ALBUMIN: 4 g/dL (ref 3.5–5.2)
ALK PHOS: 157 U/L — AB (ref 39–117)
ALT: 27 U/L (ref 0–53)
AST: 32 U/L (ref 0–37)
BUN: 15 mg/dL (ref 6–23)
CHLORIDE: 103 meq/L (ref 96–112)
CO2: 30 mEq/L (ref 19–32)
Calcium: 9.3 mg/dL (ref 8.4–10.5)
Creatinine, Ser: 1.28 mg/dL (ref 0.40–1.50)
GFR: 58.41 mL/min — ABNORMAL LOW (ref >60.00)
GLUCOSE: 100 mg/dL — AB (ref 70–99)
POTASSIUM: 4.5 meq/L (ref 3.5–5.1)
SODIUM: 139 meq/L (ref 135–145)
Total Bilirubin: 0.8 mg/dL (ref 0.2–1.2)
Total Protein: 7.4 g/dL (ref 6.0–8.3)

## 2018-04-24 LAB — VITAMIN D 25 HYDROXY (VIT D DEFICIENCY, FRACTURES): VITD: 36.34 ng/mL (ref 30.00–100.00)

## 2018-04-24 LAB — CBC
HCT: 39.1 % (ref 39.0–52.0)
HEMOGLOBIN: 13.6 g/dL (ref 13.0–17.0)
MCHC: 34.7 g/dL (ref 30.0–36.0)
MCV: 94.4 fl (ref 78.0–100.0)
Platelets: 158 10*3/uL (ref 150.0–400.0)
RBC: 4.15 Mil/uL — ABNORMAL LOW (ref 4.22–5.81)
RDW: 13.7 % (ref 11.5–15.5)
WBC: 8.1 10*3/uL (ref 4.0–10.5)

## 2018-04-25 ENCOUNTER — Telehealth: Payer: Self-pay

## 2018-04-25 ENCOUNTER — Encounter: Payer: Self-pay | Admitting: Family Medicine

## 2018-04-25 DIAGNOSIS — R748 Abnormal levels of other serum enzymes: Secondary | ICD-10-CM

## 2018-04-25 NOTE — Telephone Encounter (Signed)
-----   Message from Glori LuisEric G Sonnenberg, MD sent at 04/24/2018  8:28 AM EDT ----- Please let the patient know that his iron studies are now acceptable.  He should continue his iron supplement.  If he is ever able to arrange transportation we can have him see GI.  Thanks.

## 2018-04-26 ENCOUNTER — Other Ambulatory Visit (INDEPENDENT_AMBULATORY_CARE_PROVIDER_SITE_OTHER): Payer: Medicare Other

## 2018-04-26 DIAGNOSIS — R748 Abnormal levels of other serum enzymes: Secondary | ICD-10-CM | POA: Diagnosis not present

## 2018-04-26 LAB — HEPATIC FUNCTION PANEL
ALBUMIN: 3.9 g/dL (ref 3.5–5.2)
ALK PHOS: 159 U/L — AB (ref 39–117)
ALT: 24 U/L (ref 0–53)
AST: 25 U/L (ref 0–37)
Bilirubin, Direct: 0.2 mg/dL (ref 0.0–0.3)
Total Bilirubin: 1 mg/dL (ref 0.2–1.2)
Total Protein: 7 g/dL (ref 6.0–8.3)

## 2018-04-26 LAB — GAMMA GT: GGT: 100 U/L — AB (ref 7–51)

## 2018-04-26 NOTE — Telephone Encounter (Signed)
This encounter was created in error - please disregard.

## 2018-05-04 ENCOUNTER — Encounter: Payer: Self-pay | Admitting: General Surgery

## 2018-05-05 ENCOUNTER — Other Ambulatory Visit: Payer: Self-pay | Admitting: Family Medicine

## 2018-05-05 DIAGNOSIS — R748 Abnormal levels of other serum enzymes: Secondary | ICD-10-CM

## 2018-05-05 IMAGING — US US EXTREM LOW VENOUS*R*
1 series · 13 of 24 positions shown · non-contrast
Comparison: None.

CLINICAL DATA: Lower extremity edema for 1 month

EXAM:
RIGHT LOWER EXTREMITY VENOUS DUPLEX ULTRASOUND
TECHNIQUE: Gray-scale sonography with graded compression, as well as color
Doppler and duplex ultrasound were performed to evaluate the right
lower extremity deep venous system from the level of the common
femoral vein and including the common femoral, femoral, profunda
femoral, popliteal and calf veins including the posterior tibial,
peroneal and gastrocnemius veins when visible. The superficial great
saphenous vein was also interrogated. Spectral Doppler was utilized
to evaluate flow at rest and with distal augmentation maneuvers in
the common femoral, femoral and popliteal veins.

[Series 1: us extrem low venous*right* · 0.08mm/px · 13 of 35 slices shown]
[im 1/35]
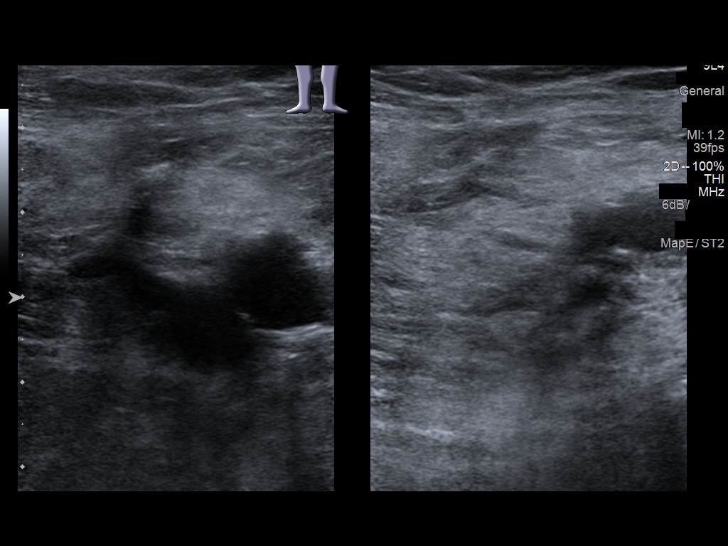
[im 3/35]
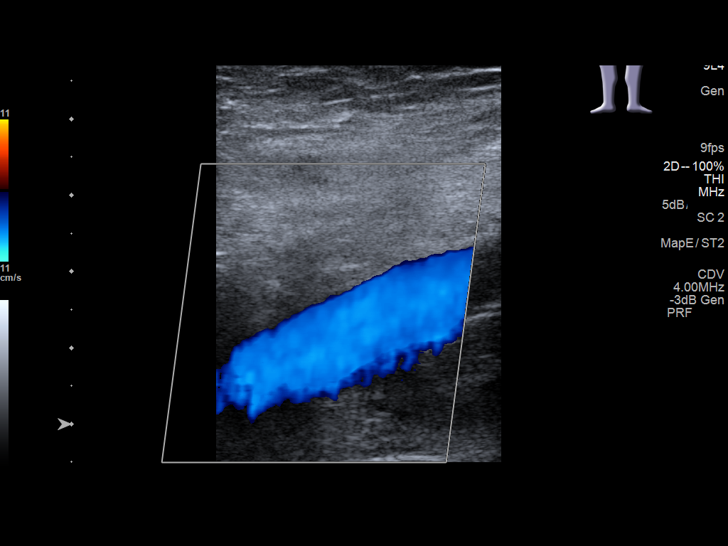
[im 6/35]
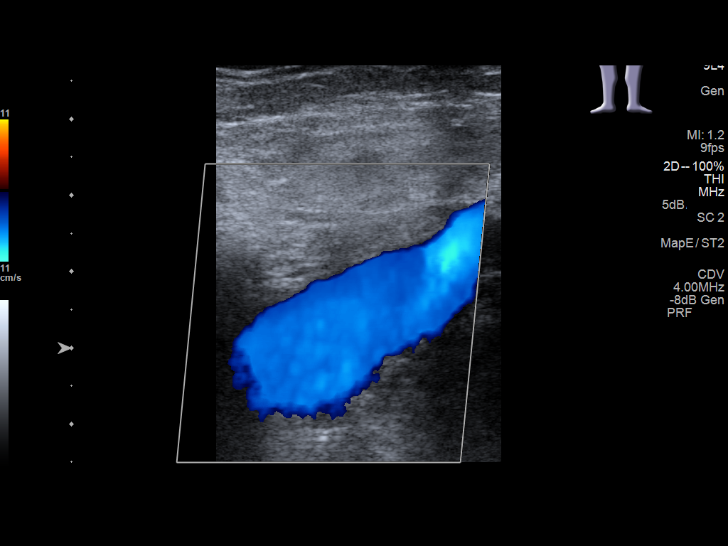
[im 9/35]
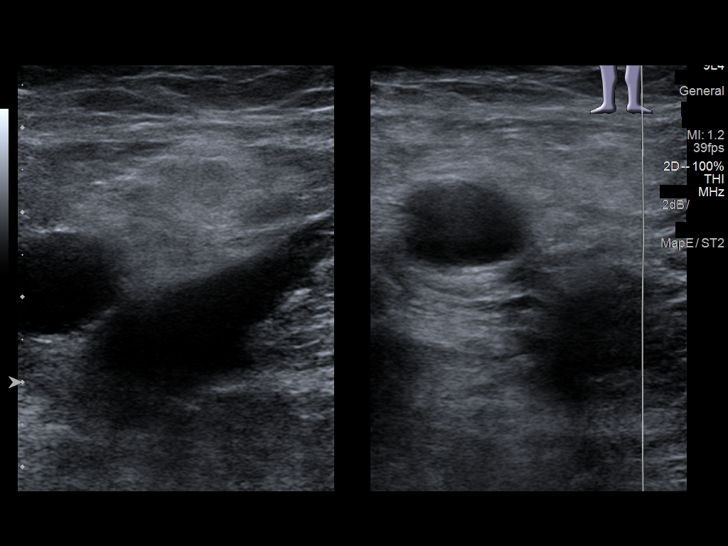
[im 12/35]
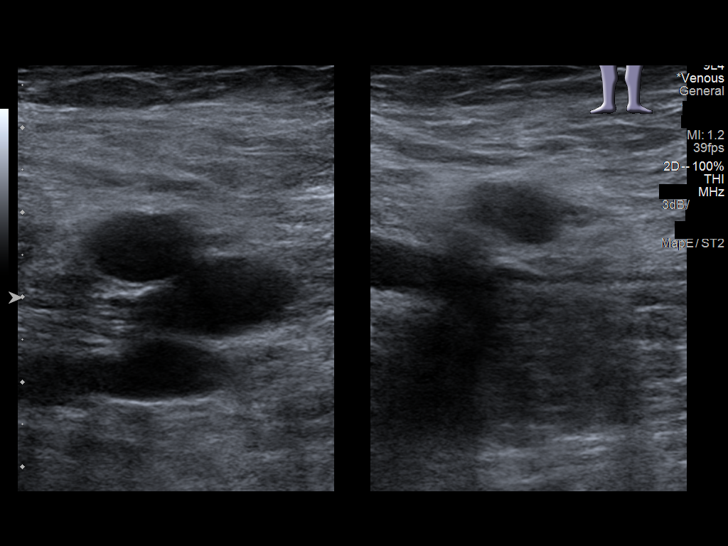
[im 15/35]
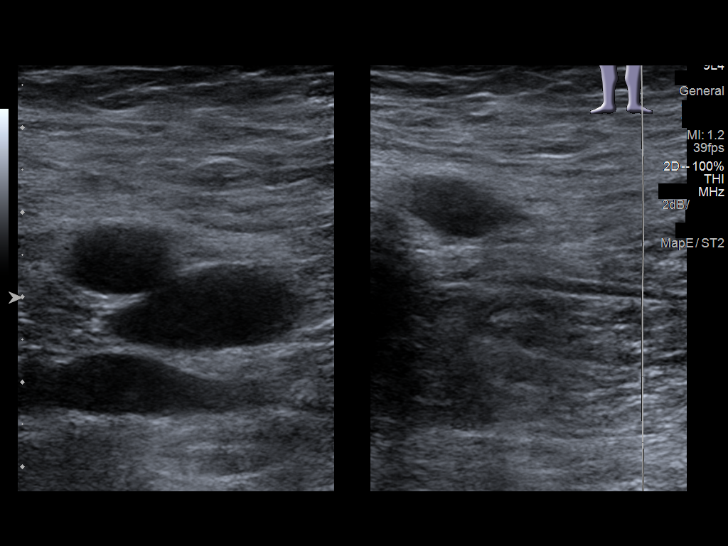
[im 18/35]
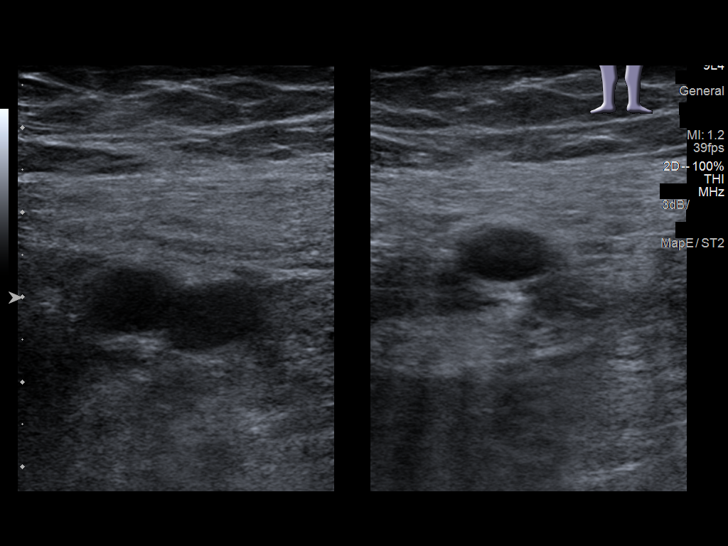
[im 20/35]
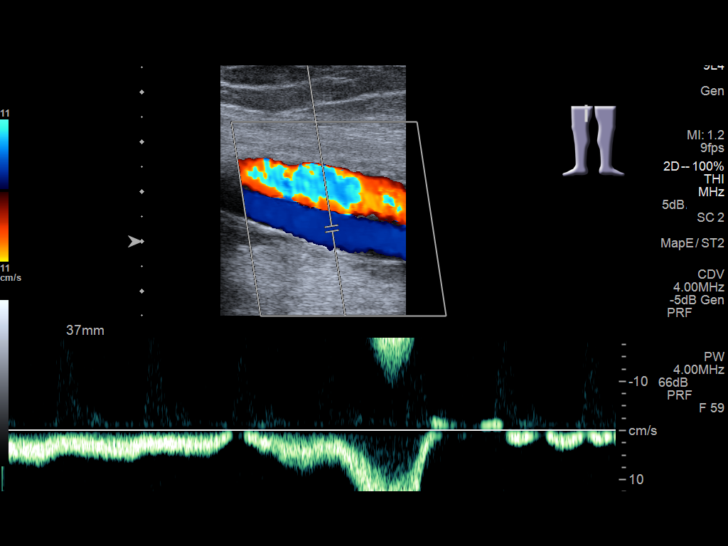
[im 23/35]
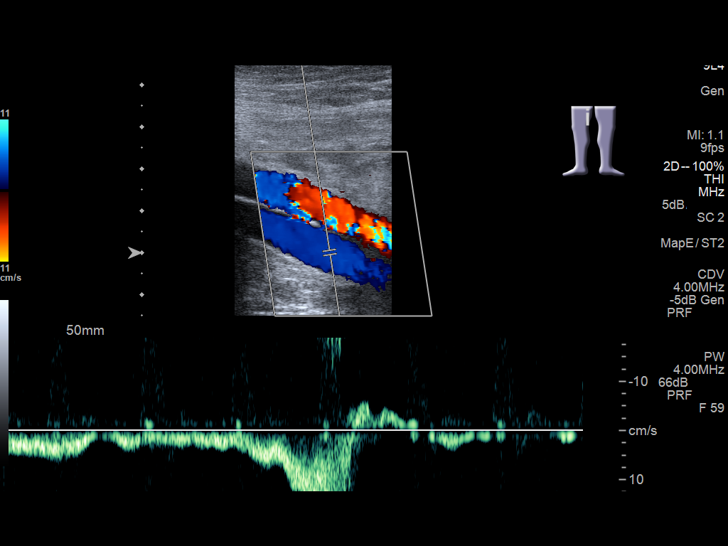
[im 26/35]
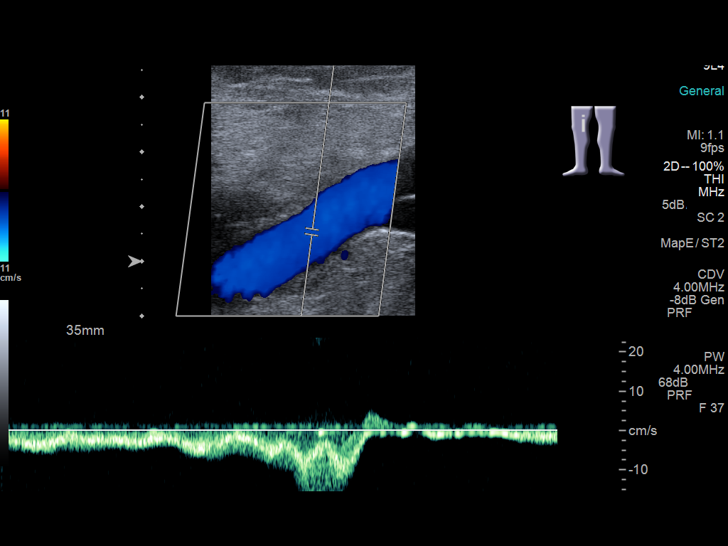
[im 29/35]
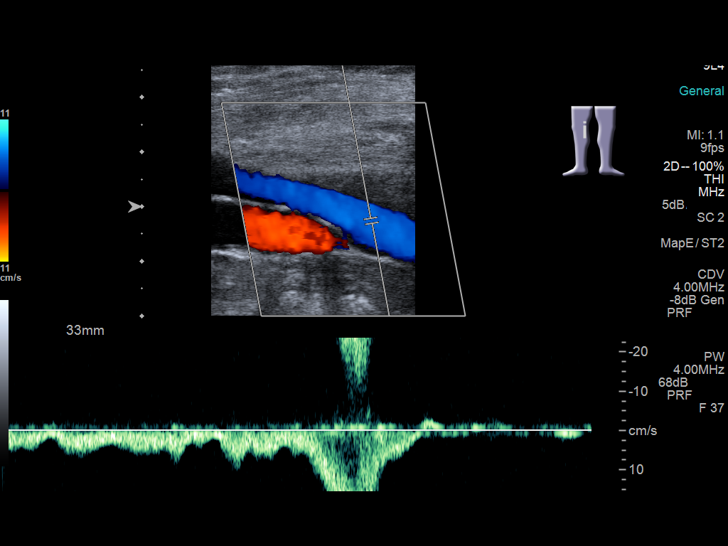
[im 32/35]
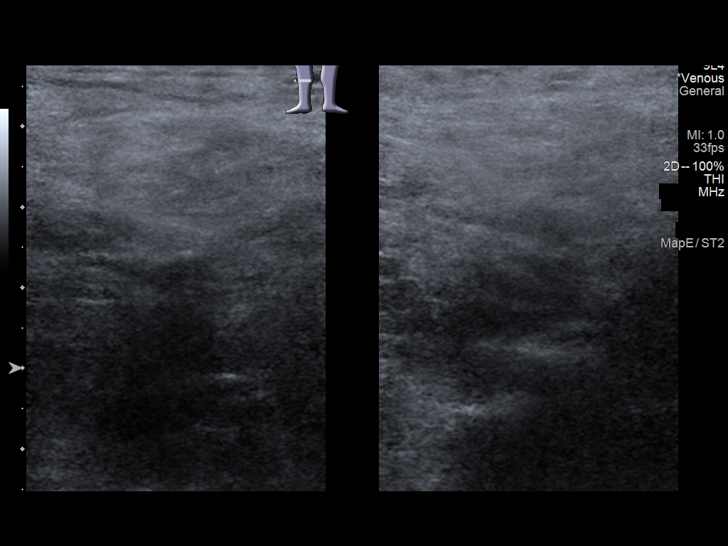
[im 35/35]
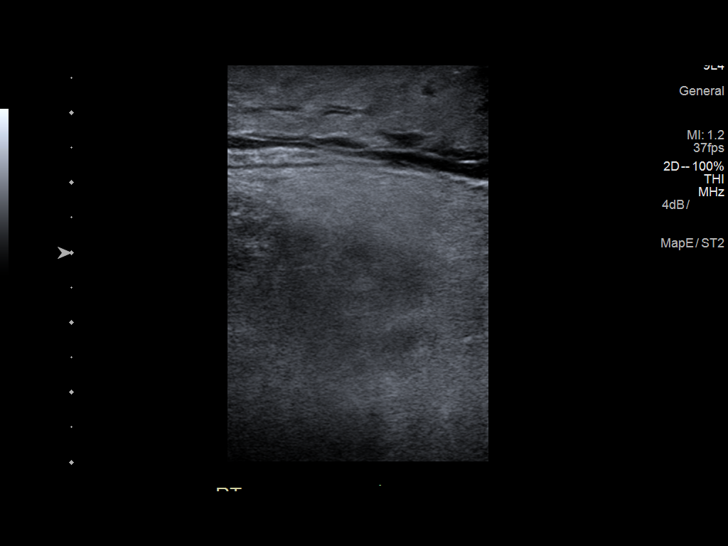

[13 of 24 positions shown; findings below may reference images not displayed]

FINDINGS: Contralateral Common Femoral Vein: Respiratory phasicity is normal
and symmetric with the symptomatic side. No evidence of thrombus.
Normal compressibility.

Common Femoral Vein: No evidence of thrombus. Normal
compressibility, respiratory phasicity and response to augmentation.

Saphenofemoral Junction: No evidence of thrombus. Normal
compressibility and flow on color Doppler imaging.

Profunda Femoral Vein: No evidence of thrombus. Normal
compressibility and flow on color Doppler imaging.

Femoral Vein: No evidence of thrombus. Normal compressibility,
respiratory phasicity and response to augmentation.

Popliteal Vein: No evidence of thrombus. Normal compressibility,
respiratory phasicity and response to augmentation.

Calf Veins: No evidence of thrombus. Normal compressibility and flow
on color Doppler imaging.

Superficial Great Saphenous Vein: No evidence of thrombus. Normal
compressibility and flow on color Doppler imaging.

Venous Reflux:  None.

Other Findings: There is soft tissue edema noted in the right calf
region.
IMPRESSION: No evidence of deep venous thrombosis in the right lower extremity.
Left common femoral vein also patent. There is calf region soft
tissue edema on the right.

## 2018-05-07 ENCOUNTER — Other Ambulatory Visit: Payer: Self-pay | Admitting: Neurology

## 2018-05-07 DIAGNOSIS — R413 Other amnesia: Secondary | ICD-10-CM

## 2018-05-14 ENCOUNTER — Ambulatory Visit: Payer: Medicare Other

## 2018-05-15 ENCOUNTER — Ambulatory Visit
Admission: RE | Admit: 2018-05-15 | Discharge: 2018-05-15 | Disposition: A | Discharge status: Home / Self Care | Payer: Medicare Other | Source: Ambulatory Visit | Attending: Neurology | Admitting: Neurology

## 2018-05-15 DIAGNOSIS — R413 Other amnesia: Secondary | ICD-10-CM | POA: Insufficient documentation

## 2018-05-15 DIAGNOSIS — G319 Degenerative disease of nervous system, unspecified: Secondary | ICD-10-CM | POA: Diagnosis not present

## 2018-05-15 DIAGNOSIS — I6782 Cerebral ischemia: Secondary | ICD-10-CM | POA: Diagnosis not present

## 2018-05-16 ENCOUNTER — Encounter: Payer: Self-pay | Admitting: Podiatry

## 2018-05-16 ENCOUNTER — Ambulatory Visit: Payer: Medicare Other | Admitting: Podiatry

## 2018-05-16 DIAGNOSIS — L603 Nail dystrophy: Secondary | ICD-10-CM

## 2018-05-16 NOTE — Progress Notes (Signed)
Subjective:  Patient ID: Danny MathJoseph Giovanni Jr., male    DOB: 1946-08-29,  MRN: [id number] HPI Chief Complaint  Patient presents with  . Nail Problem    Patient presents today with thick, long brown nails bilat hallux and left 5th toenail x 1 year.  He denies any pain or discomfort.  He is concerned for fungus.  He reports trying multiple OTC fungal creams and meds with no results    72 y.o. male presents with the above complaint.   ROS: Denies fever chills nausea vomiting muscle aches pains calf pain back pain chest pain shortness of breath.  Past Medical History:  Diagnosis Date  . Bipolar disorder (HCC)   . Chicken pox   . Glaucoma   . Heart murmur   . History of blood transfusion   . Hypertension    Past Surgical History:  Procedure Laterality Date  . ADENOIDECTOMY    . CATARACT EXTRACTION W/PHACO Right 01/13/2016   Procedure: CATARACT EXTRACTION PHACO AND INTRAOCULAR LENS PLACEMENT (IOC);  Surgeon: Lockie Molahadwick Brasington, MD;  [facility];  Service: Ophthalmology;  Laterality: Right;  . CATARACT EXTRACTION W/PHACO Left 09/07/2016   Procedure: CATARACT EXTRACTION PHACO AND INTRAOCULAR LENS PLACEMENT (IOC);  Surgeon: Lockie Molahadwick Brasington, MD;  [facility];  Service: Ophthalmology;  Laterality: Left;  PT WOULD LIKE LATER APPT  . CHOLECYSTECTOMY    . EYE SURGERY    . PENILE PROSTHESIS IMPLANT    . 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  Vitamin D, Ergocalciferol, (DRISDOL) 50000 units CAPS capsule, Take by mouth., Disp: , Rfl:  .  amLODipine (NORVASC) 10 MG tablet, TAKE 1 TABLET DAILY, Disp: 90 tablet, Rfl: 3 .  ASPIRIN 81 PO, Take 81 mg by mouth daily., Disp: , Rfl:  .  atorvastatin (LIPITOR) 40 MG tablet, TAKE 1 TABLET DAILY, Disp: 90 tablet, Rfl: 3 .  escitalopram (LEXAPRO) 10 MG tablet, Take 1 tablet (10 mg total) by mouth daily., Disp: 90 tablet, Rfl: 1 .   ferrous sulfate 325 (65 FE) MG tablet, Take 1 tablet (325 mg total) by mouth 2 (two) times daily. (Patient taking differently: Take 325 mg by mouth daily with breakfast. ), Disp: 60 tablet, Rfl: 3 .  lamoTRIgine (LAMICTAL) 25 MG tablet, Take 1 tablet (25 mg total) by mouth every evening., Disp: 90 tablet, Rfl: 1 .  QUEtiapine (SEROQUEL) 50 MG tablet, Take 1 tablet (50 mg total) by mouth at bedtime., Disp: 90 tablet, Rfl: 1 .  rivastigmine (EXELON) 6 MG capsule, Take 1 capsule (6 mg total) by mouth 2 (two) times daily., Disp: 180 capsule, Rfl: 1  No Known Allergies Review of Systems Objective:  There were no vitals filed for this visit.  General: Well developed, nourished, in no acute distress, alert and oriented x3   Dermatological: Skin is warm, dry and supple bilateral. Nails x 10 are well maintained; remaining integument appears unremarkable at this time. There are no open sores, no preulcerative lesions, no rash or signs of infection present.  Toenails are severely thick severely elongated thick kyphotic and possibly mycotic.  Vascular: Dorsalis Pedis artery and Posterior Tibial artery pedal pulses are 2/4 bilateral with immedate capillary fill time. Pedal hair growth present. No varicosities and no lower extremity edema present bilateral.   Neruologic: Grossly intact via light touch bilateral. Vibratory intact via tuning fork bilateral. Protective threshold with Semmes Wienstein monofilament intact to  all pedal sites bilateral. Patellar and Achilles deep tendon reflexes 2+ bilateral. No Babinski or clonus noted bilateral.   Musculoskeletal: No gross boney pedal deformities bilateral. No pain, crepitus, or limitation noted with foot and ankle range of motion bilateral. Muscular strength 5/5 in all groups tested bilateral.  Gait: Unassisted, Nonantalgic.    Radiographs:  None taken  Assessment & Plan:   Assessment: Nail dystrophy possibly onychomycosis  Plan: Debridement of mycotic  nails samples of the nails were taken and sent for pathologic evaluation.     Danny Mcgrath [place], [place]

## 2018-05-23 ENCOUNTER — Encounter: Payer: Self-pay | Admitting: *Deleted

## 2018-05-29 ENCOUNTER — Ambulatory Visit: Payer: Medicare Other | Admitting: General Surgery

## 2018-05-29 ENCOUNTER — Encounter: Payer: Self-pay | Admitting: General Surgery

## 2018-05-29 VITALS — BP 130/74 | HR 64 | Resp 14 | Ht 69.0 in | Wt 203.0 lb

## 2018-05-29 DIAGNOSIS — K439 Ventral hernia without obstruction or gangrene: Secondary | ICD-10-CM | POA: Diagnosis not present

## 2018-05-29 NOTE — Progress Notes (Signed)
Patient ID: Danny MathJoseph Pettinger Jr., male   DOB: 04/02/1946, 72 y.o.   MRN: [id number]  Chief Complaint  Patient presents with  . Hernia    HPI Danny MathJoseph Verville Jr. is a 72 y.o. male.  Patient here today for an evaluation of a possible hernia referred by Dr Antony HasteSonnenburg. He has noticed a knot, upper abdomen, about 6-8 months ago while taking a bath. He reports no pain or discomfort, just bulging when he stands up. No nausea, vomiting, constipation or diarrhea noted. He has a history of laparoscopic cholecystectomy done in 1995.  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  HPI  Past Medical History:  Diagnosis Date  . Bipolar disorder (HCC)   . Chicken pox   . Glaucoma   . Heart murmur   . History of blood transfusion   . Hypertension   . Mumps   . Pelvis fracture [facility](HCC)     Past Surgical History:  Procedure Laterality Date  . ADENOIDECTOMY    . CATARACT EXTRACTION W/PHACO Right 01/13/2016   Procedure: CATARACT EXTRACTION PHACO AND INTRAOCULAR LENS PLACEMENT (IOC);  Surgeon: Lockie Molahadwick Brasington, MD;  [facility];  Service: Ophthalmology;  Laterality: Right;  . CATARACT EXTRACTION W/PHACO Left 09/07/2016   Procedure: CATARACT EXTRACTION PHACO AND INTRAOCULAR LENS PLACEMENT (IOC);  Surgeon: Lockie Molahadwick Brasington, MD;  [facility];  Service: Ophthalmology;  Laterality: Left;  PT WOULD LIKE LATER APPT  . CHOLECYSTECTOMY    . EYE SURGERY    . PENILE PROSTHESIS IMPLANT    . 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 Arthritis Mother   . Hypertension Mother   . Anxiety disorder Mother   . Depression Mother   . Lung cancer Mother   . Arthritis Father   . Hypertension Father   . Diabetes Father   . Lung cancer Father   . Prostate cancer Father   . Diabetes Sister   . Leukemia Sister     Social History Social History   Tobacco Use  . Smoking status:  Never Smoker  . Smokeless tobacco: Never Used  Substance Use Topics  . Alcohol use: No    Alcohol/week: 0.0 standard drinks  . Drug use: No    No Known Allergies  Current Outpatient Medications  Medication Sig Dispense Refill  . amLODipine (NORVASC) 10 MG tablet TAKE 1 TABLET DAILY 90 tablet 3  . ASPIRIN 81 PO Take 81 mg by mouth daily.    Marland Kitchen. escitalopram (LEXAPRO) 10 MG tablet Take 1 tablet (10 mg total) by mouth daily. 90 tablet 1  . ferrous sulfate 325 (65 FE) MG tablet Take 1 tablet (325 mg total) by mouth 2 (two) times daily. (Patient taking differently: Take 325 mg by mouth daily with breakfast. ) 60 tablet 3  . lamoTRIgine (LAMICTAL) 25 MG tablet Take 1 tablet (25 mg total) by mouth every evening. 90 tablet 1  . QUEtiapine (SEROQUEL) 50 MG tablet Take 1 tablet (50 mg total) by mouth at bedtime. 90 tablet 1  . rivastigmine (EXELON) 6 MG capsule Take 1 capsule (6 mg total) by mouth 2 (two) times daily. 180 capsule 1  . Vitamin D, Ergocalciferol, (DRISDOL) 50000 units CAPS capsule Take by mouth.    Marland Kitchen. atorvastatin (LIPITOR) 40 MG tablet TAKE 1 TABLET DAILY 90 tablet 3   No current facility-administered medications for this visit.  Review of Systems Review of Systems  Constitutional: Negative.   Respiratory: Negative.   Cardiovascular: Negative.     Blood pressure 130/74, pulse 64, resp. rate 14, height 5\' 9"  (1.753 m), weight 203 lb (92.1 kg).  Physical Exam Physical Exam  Constitutional: He is oriented to person, place, and time. He appears well-developed and well-nourished.  HENT:  Mouth/Throat: No oropharyngeal exudate.  Eyes: Conjunctivae are normal. No scleral icterus.  Neck: Neck supple.  Cardiovascular: Normal rate, regular rhythm and normal heart sounds.  Pulmonary/Chest: Effort normal and breath sounds normal.  Abdominal: Soft. Normal appearance and bowel sounds are normal. There is no tenderness. A hernia is present.    Epigastric hernia.  Neurological:  He is alert and oriented to person, place, and time.  Skin: Skin is warm and dry.  Psychiatric: His behavior is normal.    Data Reviewed PCP notes of April 23, 2018.  Assessment    Ventral hernia status post laparoscopic cholecystectomy with trapped adipose tissue, omentum most likely, asymptomatic.    Plan    Patient reports the area is been stable since discovery and is asymptomatic.  He defers repair at this time.  Patient was encouraged to call if the area becomes noticeably larger or if he has any discomfort.  The patient is aware to call back for any questions, concerns, pain or symptoms worsen.      HPI, Physical Exam, Assessment and Plan have been scribed under the direction and in the presence of Earline MayotteJeffrey W. Dalton Molesworth, MD. Dorathy DaftMarsha Hatch, RN  I have completed the exam and reviewed the above documentation for accuracy and completeness.  I agree with the above.  [employer] has been used and any errors in dictation or transcription are unintentional.  Donnalee CurryJeffrey Niaya Hickok, M.D., F.A.C.S.  Merrily PewJeffrey W Roza Creamer 05/30/2018, 6:41 PM

## 2018-05-29 NOTE — Patient Instructions (Addendum)
The patient is aware to call back for any questions, concerns, pain or symptoms worsen.  Hernia, Adult A hernia is the bulging of an organ or tissue through a weak spot in the muscles of the abdomen (abdominal wall). Hernias develop most often near the navel or groin. There are many kinds of hernias. Common kinds include:  Femoral hernia. This kind of hernia develops under the groin in the upper thigh area.  Inguinal hernia. This kind of hernia develops in the groin or scrotum.  Umbilical hernia. This kind of hernia develops near the navel.  Hiatal hernia. This kind of hernia causes part of the stomach to be pushed up into the chest.  Incisional hernia. This kind of hernia bulges through a scar from an abdominal surgery.  What are the causes? This condition may be caused by:  Heavy lifting.  Coughing over a long period of time.  Straining to have a bowel movement.  An incision made during an abdominal surgery.  A birth defect (congenital defect).  Excess weight or obesity.  Smoking.  Poor nutrition.  Cystic fibrosis.  Excess fluid in the abdomen.  Undescended testicles.  What are the signs or symptoms? Symptoms of a hernia include:  A lump on the abdomen. This is the first sign of a hernia. The lump may become more obvious with standing, straining, or coughing. It may get bigger over time if it is not treated or if the condition causing it is not treated.  Pain. A hernia is usually painless, but it may become painful over time if treatment is delayed. The pain is usually dull and may get worse with standing or lifting heavy objects.  Sometimes a hernia gets tightly squeezed in the weak spot (strangulated) or stuck there (incarcerated) and causes additional symptoms. These symptoms may include:  Vomiting.  Nausea.  Constipation.  Irritability.  How is this diagnosed? A hernia may be diagnosed with:  A physical exam. During the exam your health care provider  may ask you to cough or to make a specific movement, because a hernia is usually more visible when you move.  Imaging tests. These can include: ? X-rays. ? Ultrasound. ? CT scan.  How is this treated? A hernia that is small and painless may not need to be treated. A hernia that is large or painful may be treated with surgery. Inguinal hernias may be treated with surgery to prevent incarceration or strangulation. Strangulated hernias are always treated with surgery, because lack of blood to the trapped organ or tissue can cause it to die. Surgery to treat a hernia involves pushing the bulge back into place and repairing the weak part of the abdomen. Follow these instructions at home:  Avoid straining.  Do not lift anything heavier than 10 lb (4.5 kg).  Lift with your leg muscles, not your back muscles. This helps avoid strain.  When coughing, try to cough gently.  Prevent constipation. Constipation leads to straining with bowel movements, which can make a hernia worse or cause a hernia repair to break down. You can prevent constipation by: ? Eating a high-fiber diet that includes plenty of fruits and vegetables. ? Drinking enough fluids to keep your urine clear or pale yellow. Aim to drink 6-8 glasses of water per day. ? Using a stool softener as directed by your health care provider.  Lose weight, if you are overweight.  Do not use any tobacco products, including cigarettes, chewing tobacco, or electronic cigarettes. If you need help quitting,  ask your health care provider.  Keep all follow-up visits as directed by your health care provider. This is important. Your health care provider may need to monitor your condition. Contact a health care provider if:  You have swelling, redness, and pain in the affected area.  Your bowel habits change. Get help right away if:  You have a fever.  You have abdominal pain that is getting worse.  You feel nauseous or you vomit.  You cannot  push the hernia back in place by gently pressing on it while you are lying down.  The hernia: ? Changes in shape or size. ? Is stuck outside the abdomen. ? Becomes discolored. ? Feels hard or tender. This information is not intended to replace advice given to you by your health care provider. Make sure you discuss any questions you have with your health care provider. Document Released: 10/03/2005 Document Revised: 03/02/2016 Document Reviewed: 08/13/2014 Elsevier Interactive Patient Education  2017 [employer]

## 2018-06-19 ENCOUNTER — Other Ambulatory Visit: Payer: Self-pay | Admitting: Family Medicine

## 2018-06-20 ENCOUNTER — Ambulatory Visit: Payer: Medicare Other | Admitting: Podiatry

## 2018-06-20 ENCOUNTER — Other Ambulatory Visit: Payer: Self-pay | Admitting: *Deleted

## 2018-06-20 ENCOUNTER — Encounter: Payer: Self-pay | Admitting: Podiatry

## 2018-06-20 DIAGNOSIS — Z79899 Other long term (current) drug therapy: Secondary | ICD-10-CM

## 2018-06-20 DIAGNOSIS — L603 Nail dystrophy: Secondary | ICD-10-CM

## 2018-06-20 MED ORDER — ATORVASTATIN CALCIUM 40 MG PO TABS: 40.0000 mg | ORAL_TABLET | Freq: Every day | ORAL | 3 refills | Status: DC

## 2018-06-20 MED ORDER — TERBINAFINE HCL 250 MG PO TABS: 250.0000 mg | ORAL_TABLET | Freq: Every day | ORAL | 0 refills | Status: DC

## 2018-06-20 NOTE — Patient Instructions (Signed)

## 2018-06-20 NOTE — Progress Notes (Signed)
He presents today for follow-up of his onychomycosis.  Objective: Pathology report does demonstrate onychomycosis.  Assessment: Onychomycosis.  Plan: At this point he would like to start on oral therapy.  We discussed the pros and cons of oral therapy wrote a prescription for Lamisil 250 mg tablets 1 p.o. daily also requested a liver profile.  I will follow-up with him in 1 month.

## 2018-07-10 ENCOUNTER — Ambulatory Visit (INDEPENDENT_AMBULATORY_CARE_PROVIDER_SITE_OTHER): Payer: Medicare Other | Admitting: Gastroenterology

## 2018-07-10 ENCOUNTER — Encounter: Payer: Self-pay | Admitting: Gastroenterology

## 2018-07-10 VITALS — BP 123/79 | HR 76 | Wt 190.4 lb

## 2018-07-10 DIAGNOSIS — R748 Abnormal levels of other serum enzymes: Secondary | ICD-10-CM

## 2018-07-10 NOTE — Progress Notes (Signed)
Gastroenterology Consultation  Referring Provider:     Leone Haven, Danny Mcgrath Primary Care Physician:  Leone Haven, Danny Mcgrath Primary Gastroenterologist:  Dr. Allen Norris     Reason for Consultation:     Increased alk phosphatase        HPI:   Danny Kopke. is a 72 y.o. y/o male referred for consultation & management of increased alk phosphatase by Dr. Caryl Bis, Angela Adam, Danny Mcgrath.  This patient comes in today with a chronically elevated alkaline phosphatase.The patient had normal alkaline phosphatase back in 2016 but has had mildly elevated alkaline phosphatase ever since.  The patient reports that he had his gallbladder taken out in the 70s.  He reports it to be a complicated procedure that lasted 3 hours and was laparoscopic.  The patient denies any abdominal pain at the present time.  He also denies any jaundice.  The patient's other liver enzymes have been absolutely normal.  There is no report of any black stools or bloody stools.  The patient denies having a screening colonoscopy but states he had a stool test that was negative.  Past Medical History:  Diagnosis Date  . Bipolar disorder ([place])   . Chicken pox   . Glaucoma   . Heart murmur   . History of blood transfusion   . Hypertension   . Mumps   . Pelvis fracture [facility])     Past Surgical History:  Procedure Laterality Date  . ADENOIDECTOMY    . CATARACT EXTRACTION W/PHACO Right 01/13/2016   Procedure: CATARACT EXTRACTION PHACO AND INTRAOCULAR LENS PLACEMENT (IOC);  Surgeon: Leandrew Koyanagi, Danny Mcgrath;  Location: [place];  Service: Ophthalmology;  Laterality: Right;  . CATARACT EXTRACTION W/PHACO Left 09/07/2016   Procedure: CATARACT EXTRACTION PHACO AND INTRAOCULAR LENS PLACEMENT (IOC);  Surgeon: Leandrew Koyanagi, Danny Mcgrath;  Location: [place];  Service: Ophthalmology;  Laterality: Left;  PT WOULD LIKE LATER APPT  . CHOLECYSTECTOMY    . EYE SURGERY    . PENILE PROSTHESIS IMPLANT    . 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 Authorizing Provider  amLODipine (NORVASC) 10 MG tablet TAKE 1 TABLET DAILY 11/06/17  Yes Leone Haven, Danny Mcgrath  ASPIRIN 81 PO Take 81 mg by mouth daily.   Yes Provider, Historical, Danny Mcgrath  atorvastatin (LIPITOR) 40 MG tablet Take 1 tablet (40 mg total) by mouth daily. 06/20/18  Yes Leone Haven, Danny Mcgrath  escitalopram (LEXAPRO) 10 MG tablet Take 1 tablet (10 mg total) by mouth daily. 03/27/18  Yes Eappen, Ria Clock, Danny Mcgrath  ferrous sulfate 325 (65 FE) MG tablet TAKE 1 TABLET BY MOUTH TWICE A DAY 06/19/18  Yes Leone Haven, Danny Mcgrath  lamoTRIgine (LAMICTAL) 25 MG tablet Take 1 tablet (25 mg total) by mouth every evening. 03/27/18  Yes Eappen, Saramma, Danny Mcgrath  QUEtiapine (SEROQUEL) 50 MG tablet Take 1 tablet (50 mg total) by mouth at bedtime. 03/27/18  Yes Ursula Alert, Danny Mcgrath  rivastigmine (EXELON) 6 MG capsule Take 1 capsule (6 mg total) by mouth 2 (two) times daily. 03/27/18  Yes Ursula Alert, Danny Mcgrath  terbinafine (LAMISIL) 250 MG tablet Take 1 tablet (250 mg total) by mouth daily. 06/20/18  Yes Hyatt, Max T, DPM    Family History  Problem Relation Age of Onset  . Arthritis Mother   . Hypertension Mother   . Anxiety disorder Mother   .  Depression Mother   . Lung cancer Mother   . Arthritis Father   . Hypertension Father   . Diabetes Father   . Lung cancer Father   . Prostate cancer Father   . Diabetes Sister   . Leukemia Sister      Social History   Tobacco Use  . Smoking status: Never Smoker  . Smokeless tobacco: Never Used  Substance Use Topics  . Alcohol use: No    Alcohol/week: 0.0 standard drinks  . Drug use: No    Allergies as of 07/10/2018  . (No Known Allergies)    Review of Systems:    All systems reviewed and negative except where noted in HPI.   Physical Exam:  BP 123/79   Pulse 76   Wt 190 lb 6.4 oz (86.4 kg)   BMI 28.12 kg/m     No LMP for male patient. General:   Alert,  Well-developed, well-nourished, pleasant and cooperative in NAD Head:  Normocephalic and atraumatic. Eyes:  Sclera clear, no icterus.   Conjunctiva pink. Ears:  Normal auditory acuity. Nose:  No deformity, discharge, or lesions. Mouth:  No deformity or lesions,oropharynx pink & moist. Neck:  Supple; no masses or thyromegaly. Lungs:  Respirations even and unlabored.  Clear throughout to auscultation.   No wheezes, crackles, or rhonchi. No acute distress. Heart:  Regular rate and rhythm; no murmurs, clicks, rubs, or gallops. Abdomen:  Normal bowel sounds.  No bruits.  Soft, non-tender and non-distended without masses, hepatosplenomegaly or hernias noted.  No guarding or rebound tenderness.  Negative Carnett sign.   Rectal:  Deferred.  Msk:  Symmetrical without gross deformities.  Good, equal movement & strength bilaterally. Pulses:  Normal pulses noted. Extremities:  No clubbing or edema.  No cyanosis. Neurologic:  Alert and oriented x3;  grossly normal neurologically. Skin:  Intact without significant lesions or rashes.  No jaundice. Lymph Nodes:  No significant cervical adenopathy. Psych:  Alert and cooperative. Normal mood and affect.  Imaging Studies: No results found.  Assessment and Plan:   Danny Sine. is a 72 y.o. y/o male who comes in with a elevated alkaline phosphatase since 2017.  The patient has medications that he is presently taking that can increase his alkaline phosphatase.  The patient also had a elevated GGT suggestive of this being a liver cause of the elevation of the abdomen phosphatase.  The patient will still have his alkaline phosphatase fractionated and he will also have his blood sent off for AMA.  The patient will also be set up for a right upper quadrant ultrasound to make sure there is no irritation to the bile duct with a retained stone or dilation of the common bile duct.  The patient has been explained the  plan and agrees with it.  Danny Lame, Danny Mcgrath. Marval Regal    Note: This dictation was prepared with Dragon dictation along with smaller phrase technology. Any transcriptional errors that result from this process are unintentional.

## 2018-07-10 NOTE — Patient Instructions (Addendum)
You are scheduled for a RUQ abdominal US at [facility] [place] location on Friday, Sept 27th @ 10:15am. Please arrive at the registration desk at 10:00am. You cannot have anything to eat or drink after midnight on Thursday night  If you need to reschedule this appointment for any reason, please contact central scheduling at [phone].

## 2018-07-12 LAB — ALKALINE PHOSPHATASE, ISOENZYMES
Alkaline Phosphatase: 123 IU/L — ABNORMAL HIGH (ref 39–117)
BONE FRACTION: 31 % (ref 12–68)
INTESTINAL FRAC.: 0 % (ref 0–18)
LIVER FRACTION: 69 % (ref 13–88)

## 2018-07-12 LAB — MITOCHONDRIAL ANTIBODIES: Mitochondrial Ab: <20 Units (ref 0.0–20.0)

## 2018-07-13 ENCOUNTER — Ambulatory Visit
Admission: RE | Admit: 2018-07-13 | Discharge: 2018-07-13 | Disposition: A | Discharge status: Home / Self Care | Payer: Medicare Other | Source: Ambulatory Visit | Attending: Gastroenterology | Admitting: Gastroenterology

## 2018-07-13 DIAGNOSIS — K8050 Calculus of bile duct without cholangitis or cholecystitis without obstruction: Secondary | ICD-10-CM | POA: Diagnosis not present

## 2018-07-13 DIAGNOSIS — K838 Other specified diseases of biliary tract: Secondary | ICD-10-CM | POA: Diagnosis not present

## 2018-07-13 DIAGNOSIS — R748 Abnormal levels of other serum enzymes: Secondary | ICD-10-CM | POA: Diagnosis not present

## 2018-07-16 ENCOUNTER — Telehealth: Payer: Self-pay

## 2018-07-16 NOTE — Telephone Encounter (Signed)
-----   Message from Danny Wohl, MD sent at 07/16/2018  8:11 AM EDT ----- Please contact this patient and let him know that the ultrasound showed him to have a bile duct stone with a markedly dilated common bile duct.  The patient will need an ERCP to have the stone removed before a causes further problems. 

## 2018-07-16 NOTE — Telephone Encounter (Signed)
LVM for pt to return my call.

## 2018-07-18 ENCOUNTER — Other Ambulatory Visit: Payer: Self-pay

## 2018-07-18 DIAGNOSIS — K838 Other specified diseases of biliary tract: Secondary | ICD-10-CM

## 2018-07-18 NOTE — Telephone Encounter (Signed)
Pt notified of [place] results. Pt has been scheduled for an ERCP with Wohl at [facility] on 07/24/18. Pt

## 2018-07-18 NOTE — Telephone Encounter (Signed)
-----   Message from Midge Minium, MD sent at 07/16/2018  8:11 AM EDT ----- Please contact this patient and let him know that the ultrasound showed him to have a bile duct stone with a markedly dilated common bile duct.  The patient will need an ERCP to have the stone removed before a causes further problems.

## 2018-07-24 ENCOUNTER — Encounter: Payer: Self-pay | Admitting: Anesthesiology

## 2018-07-24 ENCOUNTER — Ambulatory Visit: Admission: RE | Admit: 2018-07-24 | Payer: Medicare Other | Source: Ambulatory Visit | Admitting: Gastroenterology

## 2018-07-24 ENCOUNTER — Encounter: Admission: RE | Payer: Self-pay | Source: Ambulatory Visit

## 2018-07-24 SURGERY — ERCP, WITH INTERVENTION IF INDICATED
Anesthesia: General

## 2018-07-25 ENCOUNTER — Ambulatory Visit: Payer: Medicare Other | Admitting: Podiatry

## 2018-07-31 ENCOUNTER — Ambulatory Visit: Payer: Medicare Other | Admitting: Psychiatry

## 2018-08-01 ENCOUNTER — Ambulatory Visit: Payer: Medicare Other | Admitting: Podiatry

## 2018-08-07 ENCOUNTER — Ambulatory Visit: Payer: Medicare Other | Admitting: Psychiatry

## 2018-08-22 ENCOUNTER — Ambulatory Visit (INDEPENDENT_AMBULATORY_CARE_PROVIDER_SITE_OTHER): Payer: Medicare Other | Admitting: Psychiatry

## 2018-08-22 VITALS — BP 128/78 | Temp 98.8°F | Wt 195.1 lb

## 2018-08-22 DIAGNOSIS — F3181 Bipolar II disorder: Secondary | ICD-10-CM

## 2018-08-22 DIAGNOSIS — F09 Unspecified mental disorder due to known physiological condition: Secondary | ICD-10-CM

## 2018-08-22 MED ORDER — ESCITALOPRAM OXALATE 10 MG PO TABS: 15.0000 mg | ORAL_TABLET | Freq: Every day | ORAL | 1 refills | Status: DC

## 2018-08-22 NOTE — Progress Notes (Signed)
BH MD OP Progress Note  08/22/2018 5:23 PM Danny Mcgrath.  MRN:  [id number]  Chief Complaint: ' I am here for follow up." Chief Complaint    Follow-up     HPI: Danny Mcgrath is a 72 year old divorced Caucasian male, lives in [place], [place], has a history of bipolar disorder, cognitive disorder, presented to the clinic today for a follow-up visit.  Patient today reports that he had an episode a month ago when he felt very depressed.  He reports it was a passing spell.  He did not do well wanted to much.  He reports that he also had  passive self-injurious thoughts for a few moments that day.  He however was able to distract himself.  He did not have any plan.  He denies any history of suicide attempts.  He reports he does not know what happened however he feels better now.  He continues to be compliant with his medications.  He is currently on Lexapro 10 mg p.o. daily.  Patient reports he is financially okay and does not have any significant psychosocial stressors.  He lives by himself and does not have any family however he reports he does not feel lonely.  He has his pets whom he lives and helps him to feel better.  He also has a friend who lives close to him who visits him once a week.  Patient reports he has started reading his Bible again.  Discussed readjusting his Lexapro dosage since he has been on the same dose for a long time.  He agrees with plan.  Also discussed referral to psychotherapy sessions here in clinic.   Visit Diagnosis:    ICD-10-CM   1. Bipolar 2 disorder (HCC) F31.81 escitalopram (LEXAPRO) 10 MG tablet   major depressive episode   2. Cognitive disorder F09     Past Psychiatric History: I have reviewed past psychiatric history from my progress note on 12/25/2017  Past Medical History:  Past Medical History:  Diagnosis Date  . Bipolar disorder (HCC)   . Chicken pox   . Glaucoma   . Heart murmur   . History of blood transfusion   . Hypertension   . Mumps    . Pelvis fracture [facility])     Past Surgical History:  Procedure Laterality Date  . ADENOIDECTOMY    . CATARACT EXTRACTION W/PHACO Right 01/13/2016   Procedure: CATARACT EXTRACTION PHACO AND INTRAOCULAR LENS PLACEMENT (IOC);  Surgeon: Lockie Mola, MD;  Location: [facility];  Service: Ophthalmology;  Laterality: Right;  . CATARACT EXTRACTION W/PHACO Left 09/07/2016   Procedure: CATARACT EXTRACTION PHACO AND INTRAOCULAR LENS PLACEMENT (IOC);  Surgeon: Lockie Mola, MD;  Location: [facility];  Service: Ophthalmology;  Laterality: Left;  PT WOULD LIKE LATER APPT  . CHOLECYSTECTOMY    . EYE SURGERY    . PENILE PROSTHESIS IMPLANT    . 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 Arthritis Mother   . Hypertension Mother   . Anxiety disorder Mother   . Depression Mother   . Lung cancer Mother   . Arthritis Father   . Hypertension Father   . Diabetes Father   . Lung cancer Father   . Prostate cancer Father   . Diabetes Sister   . Leukemia  Sister     Social History: I have reviewed social history from my progress note on 12/25/2017 Social History   Socioeconomic History  . Marital status: Single    Spouse name: Not on file  . Number of children: Not on file  . Years of education: Not on file  . Highest education level: Not on file  Occupational History  . Not on file  Social Needs  . Financial resource strain: Not on file  . Food insecurity:    Worry: Not on file    Inability: Not on file  . Transportation needs:    Medical: No    Non-medical: No  Tobacco Use  . Smoking status: Never Smoker  . Smokeless tobacco: Never Used  Substance and Sexual Activity  . Alcohol use: No    Alcohol/week: 0.0 standard drinks  . Drug use: No  .  Sexual activity: Not Currently  Lifestyle  . Physical activity:    Days per week: Not on file    Minutes per session: Not on file  . Stress: Not on file  Relationships  . Social connections:    Talks on phone: Not on file    Gets together: Not on file    Attends religious service: Not on file    Active member of club or organization: Not on file    Attends meetings of clubs or organizations: Not on file    Relationship status: Not on file  Other Topics Concern  . Not on file  Social History Narrative  . Not on file    Allergies: No Known Allergies  Metabolic Disorder Labs: Lab Results  Component Value Date   HGBA1C 5.6 05/23/2017   No results found for: PROLACTIN Lab Results  Component Value Date   CHOL 110 05/23/2017   TRIG 66.0 05/23/2017   HDL 42.90 05/23/2017   CHOLHDL 3 05/23/2017   VLDL 13.2 05/23/2017   LDLCALC 54 05/23/2017   LDLCALC 52 05/18/2016   No results found for: TSH  Therapeutic Level Labs: No results found for: LITHIUM No results found for: VALPROATE No components found for:  CBMZ  Current Medications: Current Outpatient Medications  Medication Sig Dispense Refill  . amLODipine (NORVASC) 10 MG tablet TAKE 1 TABLET DAILY 90 tablet 3  . ASPIRIN 81 PO Take 81 mg by mouth daily.    Marland Kitchen atorvastatin (LIPITOR) 40 MG tablet Take 1 tablet (40 mg total) by mouth daily. 90 tablet 3  . cholecalciferol (VITAMIN D) 1000 units tablet Take 1,000 Units by mouth daily.    Marland Kitchen escitalopram (LEXAPRO) 10 MG tablet Take 1.5 tablets (15 mg total) by mouth daily. 135 tablet 1  . ferrous sulfate 325 (65 FE) MG tablet TAKE 1 TABLET BY MOUTH TWICE A DAY 60 tablet 3  . lamoTRIgine (LAMICTAL) 25 MG tablet Take 1 tablet (25 mg total) by mouth every evening. 90 tablet 1  . QUEtiapine (SEROQUEL) 50 MG tablet Take 1 tablet (50 mg total) by mouth at bedtime. 90 tablet 1  . rivastigmine (EXELON) 6 MG capsule Take 1 capsule (6 mg total) by mouth 2 (two) times daily. 180 capsule 1   . terbinafine (LAMISIL) 250 MG tablet Take 1 tablet (250 mg total) by mouth daily. 30 tablet 0   No current facility-administered medications for this visit.      Musculoskeletal: Strength & Muscle Tone: within normal limits Gait & Station: normal Patient leans: N/A  Psychiatric Specialty Exam: Review of Systems  Psychiatric/Behavioral: Positive for depression (  on and off).  All other systems reviewed and are negative.   Blood pressure 128/78, temperature 98.8 F (37.1 C), weight 195 lb 1.6 oz (88.5 kg).Body mass index is 28.81 kg/m.  General Appearance: Casual  Eye Contact:  Fair  Speech:  Clear and Coherent  Volume:  Normal  Mood:  Dysphoric  Affect:  Appropriate  Thought Process:  Goal Directed and Descriptions of Associations: Intact  Orientation:  Full (Time, Place, and Person)  Thought Content: Logical   Suicidal Thoughts:  No  Homicidal Thoughts:  No  Memory:  Immediate;   Fair Recent;   Fair Remote;   Fair  Judgement:  Fair  Insight:  Fair  Psychomotor Activity:  Normal  Concentration:  Concentration: Fair and Attention Span: Fair  Recall:  [employer] of Knowledge: Fair  Language: Fair  Akathisia:  No  Handed:  Right  AIMS (if indicated):0  Assets:  Communication Skills Desire for Improvement Social Support  ADL's:  Intact  Cognition: WNL  Sleep:  Fair   Screenings: Mini-Mental     [facility] Visit from 11/18/2016 in [facility]  Total Score (max 30 points )  30    PHQ2-9     Clinical Support from 11/21/2017 in [facility] Office Visit from 11/18/2016 in [facility]  PHQ-2 Total Score  0  0       Assessment and Plan: Danny Mcgrath is a 72 year old Caucasian male who has a history of bipolar disorder type II, cognitive disorder, presented to the clinic today for a follow-up visit.  Patient reports some recent depressive symptoms.  He continues to be compliant with his medications as prescribed.  He  continues to have some cognitive problems however is alert, oriented and is able to live independently.  Patient will benefit from medication changes as well as referral for psychotherapy sessions at this time.  Plan as noted below.  Plan Bipolar disorder type II Continue Seroquel 50 mg p.o. nightly Increase Lexapro to 15 mg p.o. daily Lamictal 25 mg p.o. Daily Refer for CBT.  Cognitive disorder Continue Exelon as prescribed.  Patient was referred to neurology in the past.  Follow-up in clinic in 1 month or sooner if needed  More than 50 % of the time was spent for psychoeducation and supportive psychotherapy and care coordination.  This note was generated in part or whole with voice recognition software. Voice recognition is usually quite accurate but there are transcription errors that can and very often do occur. I apologize for any typographical errors that were not detected and corrected.        Jomarie Longs, MD 08/23/2018, 2:33 PM

## 2018-08-23 ENCOUNTER — Encounter: Payer: Self-pay | Admitting: Psychiatry

## 2018-08-27 ENCOUNTER — Ambulatory Visit: Payer: Medicare Other | Admitting: Podiatry

## 2018-09-19 ENCOUNTER — Encounter: Payer: Self-pay | Admitting: Psychiatry

## 2018-09-19 ENCOUNTER — Ambulatory Visit: Payer: Medicare Other | Admitting: Psychiatry

## 2018-09-19 VITALS — BP 124/77 | HR 71 | Wt 192.0 lb

## 2018-09-19 DIAGNOSIS — F3181 Bipolar II disorder: Secondary | ICD-10-CM | POA: Diagnosis not present

## 2018-09-19 DIAGNOSIS — F09 Unspecified mental disorder due to known physiological condition: Secondary | ICD-10-CM

## 2018-09-19 MED ORDER — LAMOTRIGINE 25 MG PO TABS: 25.0000 mg | ORAL_TABLET | Freq: Two times a day (BID) | ORAL | 1 refills | Status: DC

## 2018-09-19 MED ORDER — QUETIAPINE FUMARATE 50 MG PO TABS: 50.0000 mg | ORAL_TABLET | Freq: Every day | ORAL | 1 refills | Status: DC

## 2018-09-19 NOTE — Progress Notes (Signed)
BH MD OP Progress Note  09/19/2018 5:20 PM [name]  MRN:  [id number]  Chief Complaint:  ' I am here for follow up." Chief Complaint    Follow-up     HPI: Danny Mcgrath is a 72 yr old divorced, Caucasian male, lives in [place], [place], has a history of bipolar disorder, cognitive disorder, presented to the clinic today for a follow-up visit.  Patient today reports he is making progress with regards to his mood symptoms.  He continues to have some episodes of feeling sad on and off.  He is tolerating the medications well.  Discussed increasing his lamotrigine today and he agrees with plan.  He reports sleep as fair on the Seroquel.  He denies any side effects to the Seroquel.  Aims scales today 0.  Patient reports he had simple Thanksgiving holiday.  He spent it with his pets.  He reports it was too good since he felt happy.  He denies any suicidality at this time.  He reports he is trying to connect with a church that is close by so that he can have some thing to do every week.  He denies any other concerns today. Visit Diagnosis:    ICD-10-CM   1. Bipolar 2 disorder (HCC) F31.81 QUEtiapine (SEROQUEL) 50 MG tablet    lamoTRIgine (LAMICTAL) 25 MG tablet   most recent major depressive episode  2. Mild cognitive disorder F09     Past Psychiatric History: Reviewed past psychiatric history from my progress note on 12/25/2017.  Past Medical History:  Past Medical History:  Diagnosis Date  . Bipolar disorder (HCC)   . Chicken pox   . Glaucoma   . Heart murmur   . History of blood transfusion   . Hypertension   . Mumps   . Pelvis fracture [facility](HCC)     Past Surgical History:  Procedure Laterality Date  . ADENOIDECTOMY    . CATARACT EXTRACTION W/PHACO Right 01/13/2016   Procedure: CATARACT EXTRACTION PHACO AND INTRAOCULAR LENS PLACEMENT (IOC);  Surgeon: Lockie Molahadwick Brasington, MD;  [facility];  Service: Ophthalmology;  Laterality: Right;  . CATARACT  EXTRACTION W/PHACO Left 09/07/2016   Procedure: CATARACT EXTRACTION PHACO AND INTRAOCULAR LENS PLACEMENT (IOC);  Surgeon: Lockie Molahadwick Brasington, MD;  [facility];  Service: Ophthalmology;  Laterality: Left;  PT WOULD LIKE LATER APPT  . CHOLECYSTECTOMY    . EYE SURGERY    . PENILE PROSTHESIS IMPLANT    . 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 Arthritis Mother   . Hypertension Mother   . Anxiety disorder Mother   . Depression Mother   . Lung cancer Mother   . Arthritis Father   . Hypertension Father   . Diabetes Father   . Lung cancer Father   . Prostate cancer Father   . Diabetes Sister   . Leukemia Sister     Social History: I have reviewed social history from my progress note on 12/25/2017 Social History   Socioeconomic History  . Marital status: Single    Spouse name: Not on file  . Number of children: Not on file  . Years of education: Not on file  . Highest education level: Not on file  Occupational History  . Not on file  Social Needs  . Financial resource strain: Not on file  . Food insecurity:    Worry: Not on file    Inability: Not on file  . Transportation needs:    Medical: No    Non-medical: No  Tobacco Use  . Smoking status: Never Smoker  . Smokeless tobacco: Never Used  Substance and Sexual Activity  . Alcohol use: No    Alcohol/week: 0.0 standard drinks  . Drug use: No  . Sexual activity: Not Currently  Lifestyle  . Physical activity:    Days per week: Not on file    Minutes per session: Not on file  . Stress: Not on file  Relationships  . Social connections:    Talks on phone: Not on file    Gets together: Not on file    Attends religious service: Not on file    Active member of club or organization: Not  on file    Attends meetings of clubs or organizations: Not on file    Relationship status: Not on file  Other Topics Concern  . Not on file  Social History Narrative  . Not on file    Allergies: No Known Allergies  Metabolic Disorder Labs: Lab Results  Component Value Date   HGBA1C 5.6 05/23/2017   No results found for: PROLACTIN Lab Results  Component Value Date   CHOL 110 05/23/2017   TRIG 66.0 05/23/2017   HDL 42.90 05/23/2017   CHOLHDL 3 05/23/2017   VLDL 13.2 05/23/2017   LDLCALC 54 05/23/2017   LDLCALC 52 05/18/2016   No results found for: TSH  Therapeutic Level Labs: No results found for: LITHIUM No results found for: VALPROATE No components found for:  CBMZ  Current Medications: Current Outpatient Medications  Medication Sig Dispense Refill  . amLODipine (NORVASC) 10 MG tablet TAKE 1 TABLET DAILY 90 tablet 3  . ASPIRIN 81 PO Take 81 mg by mouth daily.    Marland Kitchen atorvastatin (LIPITOR) 40 MG tablet Take 1 tablet (40 mg total) by mouth daily. 90 tablet 3  . cholecalciferol (VITAMIN D) 1000 units tablet Take 1,000 Units by mouth daily.    Marland Kitchen escitalopram (LEXAPRO) 10 MG tablet Take 1.5 tablets (15 mg total) by mouth daily. 135 tablet 1  . ferrous sulfate 325 (65 FE) MG tablet TAKE 1 TABLET BY MOUTH TWICE A DAY 60 tablet 3  . lamoTRIgine (LAMICTAL) 25 MG tablet Take 1 tablet (25 mg total) by mouth 2 (two) times daily. 180 tablet 1  . QUEtiapine (SEROQUEL) 50 MG tablet Take 1 tablet (50 mg total) by mouth at bedtime. 90 tablet 1  . rivastigmine (EXELON) 6 MG capsule Take 1 capsule (6 mg total) by mouth 2 (two) times daily. 180 capsule 1  . terbinafine (LAMISIL) 250 MG tablet Take 1 tablet (250 mg total) by mouth daily. 30 tablet 0   No current facility-administered medications for this visit.      Musculoskeletal: Strength & Muscle Tone: within normal limits Gait & Station: normal Patient leans: N/A  Psychiatric Specialty Exam: Review of Systems   Psychiatric/Behavioral: Positive for depression.  All other systems reviewed and are negative.   Blood pressure 124/77, pulse 71, weight 192 lb (87.1 kg), SpO2 91 %.Body mass index is 28.35 kg/m.  General Appearance: Casual  Eye Contact:  Fair  Speech:  Clear and Coherent  Volume:  Normal  Mood:  Dysphoric  Affect:  Congruent  Thought Process:  Goal Directed and Descriptions of Associations: Intact  Orientation:  Full (Time, Place, and Person)  Thought Content: Logical   Suicidal Thoughts:  No  Homicidal Thoughts:  No  Memory:  Immediate;   Fair Recent;   Fair Remote;   Fair  Judgement:  Fair  Insight:  Fair  Psychomotor Activity:  Normal  Concentration:  Concentration: Fair and Attention Span: Fair  Recall:  [employer] of Knowledge: Fair  Language: Fair  Akathisia:  No  Handed:  Right  AIMS (if indicated): 0  Assets:  Communication Skills Desire for Improvement  ADL's:  Intact  Cognition: WNL  Sleep:  Fair   Screenings: Mini-Mental     Office Visit from 11/18/2016 in [facility]  Total Score (max 30 points )  30    PHQ2-9     Clinical Support from 11/21/2017 in [facility] Office Visit from 11/18/2016 in [facility]  PHQ-2 Total Score  0  0       Assessment and Plan: Jvion is a 72 year old Caucasian male who has a history of bipolar disorder type II, cognitive disorder, presented to the clinic today for a follow-up visit.  Patient continues to have some depressive symptoms although improving.  Patient also struggle with memory loss and is currently under the care of neurology.  Will continue plan as noted below.  Plan Bipolar disorder type II Seroquel 50 mg p.o. nightly Lexapro 15 mg p.o. daily Increase Lamictal to 25 mg p.o. twice daily Patient has been referred for CBT-he will start psychotherapy sessions tomorrow.  Cognitive disorder-mild-multifactorial Continue Exelon as prescribed.  Patient will  continue to follow-up with neurology.  Follow-up in clinic in 4 weeks or sooner if needed.  More than 50 % of the time was spent for psychoeducation and supportive psychotherapy and care coordination.  This note was generated in part or whole with voice recognition software. Voice recognition is usually quite accurate but there are transcription errors that can and very often do occur. I apologize for any typographical errors that were not detected and corrected.       Danny Longs, MD 09/19/2018, 5:20 PM

## 2018-09-20 ENCOUNTER — Ambulatory Visit: Payer: Medicare Other | Admitting: Licensed Clinical Social Worker

## 2018-09-24 ENCOUNTER — Ambulatory Visit: Payer: Medicare Other | Admitting: Podiatry

## 2018-09-24 ENCOUNTER — Encounter

## 2018-09-24 DIAGNOSIS — L603 Nail dystrophy: Secondary | ICD-10-CM

## 2018-09-24 DIAGNOSIS — Z79899 Other long term (current) drug therapy: Secondary | ICD-10-CM

## 2018-09-24 MED ORDER — TERBINAFINE HCL 250 MG PO TABS: 250.0000 mg | ORAL_TABLET | Freq: Every day | ORAL | 0 refills | Status: DC

## 2018-09-24 NOTE — Progress Notes (Signed)
He presents today for follow-up of his nail fungus.  He states that he was taking the medication there is no problems whatsoever denies fever chills nausea vomiting muscle aches pains.  Objective: Vital signs are stable he is alert and oriented x3.  Pulses are palpable.  No change in the toenails as of yet.  Assessment: Nail dystrophy bilateral long-term therapy Lamisil.  Plan: Request another liver profile should this come back abnormal we will notify him immediately otherwise he will continue to take Lamisil for at least another 90 days I will follow-up with him at that time.

## 2018-09-25 LAB — HEPATIC FUNCTION PANEL
ALT: 29 IU/L (ref 0–44)
AST: 23 IU/L (ref 0–40)
Albumin: 4.5 g/dL (ref 3.5–4.8)
Alkaline Phosphatase: 161 IU/L — ABNORMAL HIGH (ref 39–117)
BILIRUBIN, DIRECT: 0.39 mg/dL (ref 0.00–0.40)
Bilirubin Total: 0.9 mg/dL (ref 0.0–1.2)
Total Protein: 7.4 g/dL (ref 6.0–8.5)

## 2018-09-28 ENCOUNTER — Telehealth: Payer: Self-pay | Admitting: *Deleted

## 2018-09-28 NOTE — Telephone Encounter (Signed)
-----   Message from Elinor ParkinsonMax T Hyatt, [place] sent at 09/25/2018  7:15 AM EST ----- Blood work looks normal with exception of an elevated alkaline phosphatase which has been going on for several years.  He may continue current medications.

## 2018-09-28 NOTE — Telephone Encounter (Signed)
Left message informing pt Dr. Geryl RankinsHyatt's review of results and orders.

## 2018-10-15 ENCOUNTER — Ambulatory Visit: Payer: Medicare Other | Admitting: Licensed Clinical Social Worker

## 2018-10-18 ENCOUNTER — Other Ambulatory Visit: Payer: Self-pay | Admitting: Psychiatry

## 2018-10-18 DIAGNOSIS — F09 Unspecified mental disorder due to known physiological condition: Secondary | ICD-10-CM

## 2018-10-21 ENCOUNTER — Other Ambulatory Visit: Payer: Self-pay | Admitting: Family Medicine

## 2018-10-23 ENCOUNTER — Ambulatory Visit: Payer: Medicare Other | Admitting: Psychiatry

## 2018-10-24 ENCOUNTER — Ambulatory Visit: Payer: Medicare Other | Admitting: Family Medicine

## 2018-10-31 ENCOUNTER — Ambulatory Visit: Payer: Medicare Other | Admitting: Psychiatry

## 2018-11-05 ENCOUNTER — Ambulatory Visit: Payer: Medicare Other | Admitting: Licensed Clinical Social Worker

## 2018-11-08 ENCOUNTER — Ambulatory Visit: Payer: Medicare Other | Admitting: Psychiatry

## 2018-11-22 ENCOUNTER — Ambulatory Visit: Payer: Medicare Other

## 2018-11-23 ENCOUNTER — Ambulatory Visit: Payer: Medicare Other | Admitting: Family Medicine

## 2018-11-23 ENCOUNTER — Ambulatory Visit: Payer: Medicare Other

## 2018-12-05 ENCOUNTER — Ambulatory Visit: Payer: Medicare Other | Admitting: Family Medicine

## 2018-12-06 ENCOUNTER — Ambulatory Visit: Payer: Medicare Other

## 2018-12-13 ENCOUNTER — Ambulatory Visit: Payer: Self-pay

## 2018-12-19 ENCOUNTER — Ambulatory Visit: Payer: Medicare Other | Admitting: Licensed Clinical Social Worker

## 2018-12-19 ENCOUNTER — Encounter: Payer: Self-pay | Admitting: Psychiatry

## 2018-12-19 ENCOUNTER — Other Ambulatory Visit: Payer: Self-pay

## 2018-12-19 ENCOUNTER — Ambulatory Visit (INDEPENDENT_AMBULATORY_CARE_PROVIDER_SITE_OTHER): Payer: Medicare Other | Admitting: Psychiatry

## 2018-12-19 VITALS — BP 168/86 | HR 62 | Temp 98.5°F | Wt 193.0 lb

## 2018-12-19 DIAGNOSIS — F3181 Bipolar II disorder: Secondary | ICD-10-CM

## 2018-12-19 DIAGNOSIS — F09 Unspecified mental disorder due to known physiological condition: Secondary | ICD-10-CM

## 2018-12-19 NOTE — Progress Notes (Signed)
BH MD OP Progress Note  12/19/2018 5:07 PM Danny Mcgrath.  MRN:  [id number]  Chief Complaint: ' I am here for follow up." Chief Complaint    Follow-up; Medication Refill     WUJ:WJXBJY is a 73 yr old divorced, Caucasian male, lives in [place], on [place], has a history of bipolar disorder, cognitive disorder, presented to clinic today for a follow-up visit.  Patient today reports he is doing well with regards to his mood symptoms.  He denies any significant sadness or hypomanic or manic episodes.  He reports he is compliant on his medications as prescribed.  He denies any side effects.  He reports he is planning projects to start doing some work in his yard.  He reports since it is going to get warm soon he is planning on making some flower gardens.  He reports he has always enjoyed doing work in his yard and that helps him emotionally.  He had his first visit with Ms. Peacock for therapy today and it went well.  Patient denies any suicidality, homicidality or perceptual disturbances. Visit Diagnosis:    ICD-10-CM   1. Bipolar 2 disorder (HCC) F31.81    improving  2. Mild cognitive disorder F09     Past Psychiatric History: I have reviewed past psychiatric history from my progress note on 12/25/2017  Past Medical History:  Past Medical History:  Diagnosis Date  . Bipolar disorder (HCC)   . Chicken pox   . Glaucoma   . Heart murmur   . History of blood transfusion   . Hypertension   . Mumps   . Pelvis fracture [facility])     Past Surgical History:  Procedure Laterality Date  . ADENOIDECTOMY    . CATARACT EXTRACTION W/PHACO Right 01/13/2016   Procedure: CATARACT EXTRACTION PHACO AND INTRAOCULAR LENS PLACEMENT (IOC);  Surgeon: Lockie Mola, MD;  Location: [facility];  Service: Ophthalmology;  Laterality: Right;  . CATARACT EXTRACTION W/PHACO Left 09/07/2016   Procedure: CATARACT EXTRACTION PHACO AND INTRAOCULAR LENS PLACEMENT (IOC);  Surgeon: Lockie Mola, MD;  Location: [facility];  Service: Ophthalmology;  Laterality: Left;  PT WOULD LIKE LATER APPT  . CHOLECYSTECTOMY    . EYE SURGERY    . PENILE PROSTHESIS IMPLANT    . 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 Arthritis Mother   . Hypertension Mother   . Anxiety disorder Mother   . Depression Mother   . Lung cancer Mother   . Arthritis Father   . Hypertension Father   . Diabetes Father   . Lung cancer Father   . Prostate cancer Father   . Diabetes Sister   . Leukemia Sister     Social History: Reviewed social history from my progress note on 12/25/2017 Social History   Socioeconomic History  . Marital status: Single    Spouse name: Not on file  . Number of children: Not on file  . Years of education: Not on file  . Highest education level: Not on file  Occupational History  . Not on file  Social Needs  . Financial resource strain: Not on file  . Food insecurity:    Worry: Not on file    Inability: Not on file  . Transportation needs:  Medical: No    Non-medical: No  Tobacco Use  . Smoking status: Never Smoker  . Smokeless tobacco: Never Used  Substance and Sexual Activity  . Alcohol use: No    Alcohol/week: 0.0 standard drinks  . Drug use: No  . Sexual activity: Not Currently  Lifestyle  . Physical activity:    Days per week: Not on file    Minutes per session: Not on file  . Stress: Not on file  Relationships  . Social connections:    Talks on phone: Not on file    Gets together: Not on file    Attends religious service: Not on file    Active member of club or organization: Not on file    Attends meetings of clubs or organizations: Not on file    Relationship status: Not on file  Other Topics Concern  . Not on  file  Social History Narrative  . Not on file    Allergies: No Known Allergies  Metabolic Disorder Labs: Lab Results  Component Value Date   HGBA1C 5.6 05/23/2017   No results found for: PROLACTIN Lab Results  Component Value Date   CHOL 110 05/23/2017   TRIG 66.0 05/23/2017   HDL 42.90 05/23/2017   CHOLHDL 3 05/23/2017   VLDL 13.2 05/23/2017   LDLCALC 54 05/23/2017   LDLCALC 52 05/18/2016   No results found for: TSH  Therapeutic Level Labs: No results found for: LITHIUM No results found for: VALPROATE No components found for:  CBMZ  Current Medications: Current Outpatient Medications  Medication Sig Dispense Refill  . amLODipine (NORVASC) 10 MG tablet TAKE 1 TABLET DAILY 90 tablet 3  . ASPIRIN 81 PO Take 81 mg by mouth daily.    Marland Kitchen atorvastatin (LIPITOR) 40 MG tablet Take 1 tablet (40 mg total) by mouth daily. 90 tablet 3  . cholecalciferol (VITAMIN D) 1000 units tablet Take 1,000 Units by mouth daily.    Marland Kitchen escitalopram (LEXAPRO) 10 MG tablet Take 1.5 tablets (15 mg total) by mouth daily. 135 tablet 1  . ferrous sulfate 325 (65 FE) MG tablet TAKE 1 TABLET BY MOUTH TWICE A DAY 60 tablet 3  . lamoTRIgine (LAMICTAL) 25 MG tablet Take 1 tablet (25 mg total) by mouth 2 (two) times daily. 180 tablet 1  . QUEtiapine (SEROQUEL) 50 MG tablet Take 1 tablet (50 mg total) by mouth at bedtime. 90 tablet 1  . rivastigmine (EXELON) 6 MG capsule TAKE 1 CAPSULE TWICE DAILY 180 capsule 1  . terbinafine (LAMISIL) 250 MG tablet Take 1 tablet (250 mg total) by mouth daily. 90 tablet 0   No current facility-administered medications for this visit.      Musculoskeletal: Strength & Muscle Tone: within normal limits Gait & Station: uses a cane Patient leans: N/A  Psychiatric Specialty Exam: Review of Systems  Psychiatric/Behavioral: The patient is not nervous/anxious.   All other systems reviewed and are negative.   Blood pressure (!) 168/86, pulse 62, temperature 98.5 F (36.9 C),  temperature source Oral, weight 193 lb (87.5 kg).Body mass index is 28.5 kg/m.  General Appearance: Casual  Eye Contact:  Fair  Speech:  Clear and Coherent  Volume:  Normal  Mood:  Euthymic  Affect:  Congruent  Thought Process:  Goal Directed and Descriptions of Associations: Intact  Orientation:  Full (Time, Place, and Person)  Thought Content: Logical   Suicidal Thoughts:  No  Homicidal Thoughts:  No  Memory:  Immediate;   Fair Recent;  limited Remote;   Fair  Judgement:  Fair  Insight:  Fair  Psychomotor Activity:  Normal  Concentration:  Concentration: Fair and Attention Span: Fair  Recall:  [employer] of Knowledge: Fair  Language: Fair  Akathisia:  No  Handed:  Right  AIMS (if indicated): 0  Assets:  Communication Skills Desire for Improvement Social Support  ADL's:  Intact  Cognition: WNL  Sleep:  Fair   Screenings:aims -0 Mini-Mental     Office Visit from 11/18/2016 in [facility]  Total Score (max 30 points )  30    PHQ2-9     Clinical Support from 11/21/2017 in [facility] Office Visit from 11/18/2016 in [facility]  PHQ-2 Total Score  0  0       Assessment and Plan: Danny Mcgrath is a 72 year old Caucasian male who has a history of bipolar disorder type II, cognitive disorder, presented to clinic today for a follow-up visit.  Patient reports he is making progress on the current medication regimen.  Plan as noted below.  Plan Bipolar disorder type II-improving Seroquel 50 mg p.o. nightly Lexapro 15 mg p.o. daily Lamictal 25 mg p.o. twice daily Continue psychotherapy sessions with Ms. Peacock. AIMS - 0  Cognitive disorder-mild-multifactorial-improving Continue Exelon as prescribed.  He will continue to follow-up with neurology.  Elevated blood pressure reading-continue to follow-up with PMD.  Follow-up in clinic in 3 months or sooner if needed.  I have spent atleast 15 minutes face to face with  patient today. More than 50 % of the time was spent for psychoeducation and supportive psychotherapy and care coordination.  This note was generated in part or whole with voice recognition software. Voice recognition is usually quite accurate but there are transcription errors that can and very often do occur. I apologize for any typographical errors that were not detected and corrected.        Jomarie Longs, MD 12/19/2018, 5:07 PM

## 2018-12-19 NOTE — Progress Notes (Signed)
Comprehensive Clinical Assessment (CCA) Note  12/19/2018 Danny MathJoseph Stoy Jr. [id number]  Visit Diagnosis:      ICD-10-CM   1. Bipolar 2 disorder (HCC) F31.81       CCA Part One  Part One has been completed on paper by the patient.  (See scanned document in Chart Review)  CCA Part Two A  Intake/Chief Complaint:  CCA Intake With Chief Complaint CCA Part Two Date: 12/19/18 CCA Part Two Time: 1358 Chief Complaint/Presenting Problem: The doctor wants me to come in.  I don't know why she wants me to talk.  I have no idea what my symptoms are. Patients Currently Reported Symptoms/Problems: Reports to taking Bipolar Depression medication for his mood.  On medication for 2 years.  Reports telling his PCP that he wanted to kill himself; over 10 years ago.  Denies current self harm.  Reports mood changes every few weeks. Individual's Strengths: friendship, meeting people, smart Individual's Preferences: be age 73 again Individual's Abilities: communicates well Type of Services Patient Feels Are Needed: therapy, med management  Mental Health Symptoms Depression:  Depression: Change in energy/activity, Difficulty Concentrating, Hopelessness, Worthlessness  Mania:  Mania: Racing thoughts  Anxiety:   Anxiety: N/A  Psychosis:  Psychosis: Other negative symptoms  Trauma:  Trauma: N/A  Obsessions:  Obsessions: N/A  Compulsions:  Compulsions: N/A  Inattention:  Inattention: N/A  Hyperactivity/Impulsivity:  Hyperactivity/Impulsivity: N/A  Oppositional/Defiant Behaviors:  Oppositional/Defiant Behaviors: N/A  Borderline Personality:  Emotional Irregularity: N/A  Other Mood/Personality Symptoms:      Mental Status Exam Appearance and self-care  Stature:  Stature: Average  Weight:  Weight: Average weight  Clothing:  Clothing: Casual  Grooming:  Grooming: Well-groomed  Cosmetic use:  Cosmetic Use: None  Posture/gait:  Posture/Gait: Normal  Motor activity:  Motor Activity: Not Remarkable   Sensorium  Attention:  Attention: Distractible  Concentration:  Concentration: Focuses on irrelevancies  Orientation:  Orientation: X5  Recall/memory:  Recall/Memory: Normal  Affect and Mood  Affect:  Affect: Appropriate  Mood:  Mood: Depressed  Relating  Eye contact:  Eye Contact: Normal  Facial expression:  Facial Expression: Depressed  Attitude toward examiner:  Attitude Toward Examiner: Cooperative  Thought and Language  Speech flow: Speech Flow: Normal  Thought content:  Thought Content: Ideas of reference  Preoccupation:     Hallucinations:     Organization:     Company secretaryxecutive Functions  Fund of Knowledge:     Intelligence:  Intelligence: Needs investigation  Abstraction:     Judgement:  Judgement: Poor  Reality Testing:     Insight:  Insight: Fair  Decision Making:     Social Functioning  Social Maturity:  Social Maturity: Isolates  Social Judgement:     Stress  Stressors:  Stressors: Family conflict, Grief/losses, Transitions  Coping Ability:  Coping Ability: Building surveyorverwhelmed  Skill Deficits:     Supports:      Family and Psychosocial History: Family history Marital status: Social workerMarried(11; currently on 3rd marriage) Number of Years Married: 11 Are you sexually active?: No What is your sexual orientation?: heterosexual Does patient have children?: No  Childhood History:  Childhood History By whom was/is the patient raised?: Both parents Additional childhood history information: [place] Description of patient's relationship with caregiver when they were a child: good relationships with both parents Patient's description of current relationship with people who raised him/her: deceased How were you disciplined when you got in trouble as a child/adolescent?: whippings Does patient have siblings?: Yes Number of Siblings: 2(Judy, deceased;  Sam 58) Description of patient's current relationship with siblings: no contact with Sam Did patient suffer any  verbal/emotional/physical/sexual abuse as a child?: No Did patient suffer from severe childhood neglect?: No Has patient ever been sexually abused/assaulted/raped as an adolescent or adult?: No Was the patient ever a victim of a crime or a disaster?: No Witnessed domestic violence?: No Has patient been effected by domestic violence as an adult?: No  CCA Part Two B  Employment/Work Situation: Employment / Work Psychologist, occupational Employment situation: Retired(Retired in 1991 from [employer]) What is the longest time patient has a held a job?: 17yrs Where was the patient employed at that time?: [employer]  Education: Education Name of [employer] School: [employer] in [place] Did [place] Graduate From [employer]?: Yes Did Theme park manager?: Yes What Type of College Degree Do you Have?: Bachelor Sociology Did You Have An Individualized Education Program (IIEP): No Did You Have Any Difficulty At [employer]?: No  Religion: Religion/Spirituality Are You A Religious Person?: Yes What is Your Religious Affiliation?: Baptist How Might This Affect Treatment?: denies  Leisure/Recreation: Leisure / Recreation Leisure and Hobbies: fishing, rock hunting, reading, music, movies  Exercise/Diet: Exercise/Diet Do You Exercise?: No Have You Gained or Lost A Significant Amount of Weight in the Past Six Months?: No Do You Follow a Special Diet?: No Do You Have Any Trouble Sleeping?: No  CCA Part Two C  Alcohol/Drug Use: Alcohol / Drug Use Pain Medications: see record Prescriptions: see record Over the Counter: see record History of alcohol / drug use?: No history of alcohol / drug abuse                      CCA Part Three  ASAM's:  Six Dimensions of Multidimensional Assessment  Dimension 1:  Acute Intoxication and/or Withdrawal Potential:     Dimension 2:  Biomedical Conditions and Complications:     Dimension 3:  Emotional, Behavioral, or Cognitive Conditions and  Complications:     Dimension 4:  Readiness to Change:     Dimension 5:  Relapse, Continued use, or Continued Problem Potential:     Dimension 6:  Recovery/Living Environment:      Substance use Disorder (SUD)    Social Function:  Social Functioning Social Maturity: Isolates  Stress:  Stress Stressors: Family conflict, Grief/losses, Transitions Coping Ability: Overwhelmed Patient Takes Medications The Way The Doctor Instructed?: Yes Priority Risk: Low Acuity  Risk Assessment- Self-Harm Potential: Risk Assessment For Self-Harm Potential Thoughts of Self-Harm: No current thoughts Method: No plan Availability of Means: No access/NA  Risk Assessment -Dangerous to Others Potential: Risk Assessment For Dangerous to Others Potential Method: No Plan Availability of Means: No access or NA Intent: Vague intent or NA Notification Required: No need or identified person  DSM5 Diagnoses: Patient Active Problem List   Diagnosis Date Noted  . Ventral hernia without obstruction or gangrene 04/23/2018  . Iron deficiency 04/23/2018  . Vitamin D deficiency 04/23/2018  . Onychomycosis 04/23/2018  . Family history of prostate cancer in father 10/25/2017  . Vertigo 10/25/2017  . Poor dentition 10/25/2017  . Lower extremity edema 05/24/2017  . 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 CKD (chronic kidney disease) stage 2, GFR 60-89 ml/min 11/18/2016  . Undifferentiated schizophrenia (HCC) 05/18/2016  . Hyperlipidemia 11/19/2015  . Glaucoma 07/24/2015  . Heart murmur, systolic 07/24/2015  . HTN (hypertension) 07/24/2015  . Encounter for general adult medical examination with abnormal findings 07/24/2015    Patient  Centered Plan: Patient is on the following Treatment Plan(s):  Anxiety  Recommendations for Services/Supports/Treatments: Recommendations for Services/Supports/Treatments Recommendations For Services/Supports/Treatments: Individual Therapy, Medication Management  Treatment Plan  Summary:    Referrals to Alternative Service(s): Referred to Alternative Service(s):   Place:   Date:   Time:    Referred to Alternative Service(s):   Place:   Date:   Time:    Referred to Alternative Service(s):   Place:   Date:   Time:    Referred to Alternative Service(s):   Place:   Date:   Time:     Marinda Elk

## 2019-01-16 ENCOUNTER — Ambulatory Visit: Payer: Medicare Other | Admitting: Licensed Clinical Social Worker

## 2019-01-16 ENCOUNTER — Other Ambulatory Visit: Payer: Self-pay

## 2019-01-21 ENCOUNTER — Ambulatory Visit: Payer: Medicare Other | Admitting: Podiatry

## 2019-01-23 ENCOUNTER — Encounter: Payer: Self-pay | Admitting: Podiatry

## 2019-01-23 ENCOUNTER — Other Ambulatory Visit: Payer: Self-pay

## 2019-01-23 ENCOUNTER — Ambulatory Visit: Payer: Medicare Other | Admitting: Podiatry

## 2019-01-23 ENCOUNTER — Ambulatory Visit (INDEPENDENT_AMBULATORY_CARE_PROVIDER_SITE_OTHER): Payer: Medicare Other | Admitting: Podiatry

## 2019-01-23 DIAGNOSIS — L603 Nail dystrophy: Secondary | ICD-10-CM

## 2019-01-23 NOTE — Progress Notes (Signed)
He presents today for follow-up of the Lamisil therapy he has taken 120 days worth of medication and states that he has really done nothing for the toenails.  Objective: Vital signs are stable he is alert oriented x3.  Pulses are palpable.  No change in the nail plates hallux bilateral and fifth digit left.  Assessment: Severe nail dystrophy with onychomycosis.  Failure to grow and obviously failure to heal.  Plan: At this point we will follow-up with him in June for total matrixectomy's hallux bilateral and fifth left.

## 2019-02-04 ENCOUNTER — Other Ambulatory Visit: Payer: Self-pay

## 2019-02-04 ENCOUNTER — Ambulatory Visit: Payer: Medicare Other | Admitting: Licensed Clinical Social Worker

## 2019-02-20 ENCOUNTER — Ambulatory Visit: Payer: Medicare Other | Admitting: Licensed Clinical Social Worker

## 2019-02-20 ENCOUNTER — Other Ambulatory Visit: Payer: Self-pay

## 2019-03-14 ENCOUNTER — Telehealth: Payer: Self-pay

## 2019-03-14 NOTE — Telephone Encounter (Signed)
Copied from CRM [id number]. Topic: Appointment Scheduling - Scheduling Inquiry for Clinic >> Mar 14, 2019 12:51 PM Maia Petties wrote: Reason for CRM: pt asking how he will need to proceed with appts 6/3 and 6/4.

## 2019-03-20 ENCOUNTER — Ambulatory Visit (INDEPENDENT_AMBULATORY_CARE_PROVIDER_SITE_OTHER): Payer: Medicare Other | Admitting: Family Medicine

## 2019-03-20 ENCOUNTER — Other Ambulatory Visit: Payer: Self-pay

## 2019-03-20 DIAGNOSIS — E611 Iron deficiency: Secondary | ICD-10-CM | POA: Diagnosis not present

## 2019-03-20 DIAGNOSIS — I10 Essential (primary) hypertension: Secondary | ICD-10-CM

## 2019-03-20 DIAGNOSIS — E785 Hyperlipidemia, unspecified: Secondary | ICD-10-CM

## 2019-03-20 DIAGNOSIS — Z8042 Family history of malignant neoplasm of prostate: Secondary | ICD-10-CM

## 2019-03-20 DIAGNOSIS — E663 Overweight: Secondary | ICD-10-CM | POA: Diagnosis not present

## 2019-03-20 MED ORDER — ATORVASTATIN CALCIUM 40 MG PO TABS: 40.0000 mg | ORAL_TABLET | Freq: Every day | ORAL | 3 refills | Status: AC

## 2019-03-20 MED ORDER — AMLODIPINE BESYLATE 10 MG PO TABS: 10.0000 mg | ORAL_TABLET | Freq: Every day | ORAL | 3 refills | Status: DC

## 2019-03-20 NOTE — Progress Notes (Signed)
Virtual Visit via telephone Note  This visit type was conducted due to national recommendations for restrictions regarding the COVID-19 pandemic (e.g. social distancing).  This format is felt to be most appropriate for this patient at this time.  All issues noted in this document were discussed and addressed.  No physical exam was performed (except for noted visual exam findings with Video Visits).   I connected with Danny Mcgrath today at  2:15 PM EDT by telephone and verified that I am speaking with the correct person using two identifiers. Location patient: home Location provider: work or home office Persons participating in the virtual visit: patient, provider  I discussed the limitations, risks, security and privacy concerns of performing an evaluation and management service by telephone and the availability of in person appointments. I also discussed with the patient that there may be a patient responsible charge related to this service. The patient expressed understanding and agreed to proceed.  Interactive audio and video telecommunications were attempted between this provider and patient, however failed, due to patient having technical difficulties OR patient did not have access to video capability.  We continued and completed visit with audio only.   Reason for visit: follow-up  HPI: Hypertension: Typically systolic BP 107-140.  Diastolics in the 60s.  No chest pain, shortness of breath, or edema.  Hyperlipidemia: Taking Lipitor.  No right quadrant pain, myalgias, or claudication.  Iron deficiency: Patient is no longer on iron.  No abdominal pain.  No hematuria.  No blood in his stool.  He completed Cologuard which was negative.  He has been unable to arrange transportation to see GI or have colonoscopy or EGD.  Overweight: Patient notes he had been losing some weight though he has gained it back.  His diet is fairly healthy.   ROS: See pertinent positives and negatives per  HPI.  Past Medical History:  Diagnosis Date  . Bipolar disorder (HCC)   . Chicken pox   . Glaucoma   . Heart murmur   . History of blood transfusion   . Hypertension   . Mumps   . Pelvis fracture [facility](HCC)     Past Surgical History:  Procedure Laterality Date  . ADENOIDECTOMY    . CATARACT EXTRACTION W/PHACO Right 01/13/2016   Procedure: CATARACT EXTRACTION PHACO AND INTRAOCULAR LENS PLACEMENT (IOC);  Surgeon: Lockie Molahadwick Brasington, MD;  [facility];  Service: Ophthalmology;  Laterality: Right;  . CATARACT EXTRACTION W/PHACO Left 09/07/2016   Procedure: CATARACT EXTRACTION PHACO AND INTRAOCULAR LENS PLACEMENT (IOC);  Surgeon: Lockie Molahadwick Brasington, MD;  [facility];  Service: Ophthalmology;  Laterality: Left;  PT WOULD LIKE LATER APPT  . CHOLECYSTECTOMY    . EYE SURGERY    . PENILE PROSTHESIS IMPLANT    . 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 Arthritis Mother   . Hypertension Mother   . Anxiety disorder Mother   . Depression Mother   . Lung cancer Mother   . Arthritis Father   . Hypertension Father   . Diabetes Father   . Lung cancer Father   . Prostate cancer Father   . Diabetes Sister   . Leukemia Sister     SOCIAL HX: nonsmoker   Current Outpatient Medications:  .  amLODipine (NORVASC) 10 MG tablet, Take 1 tablet (10 mg total) by mouth daily., Disp: 90 tablet, Rfl: 3 .  ASPIRIN 81 PO, Take 81 mg by mouth daily., Disp: , Rfl:  .  atorvastatin (LIPITOR) 40 MG tablet, Take 1 tablet (40 mg total) by mouth daily., Disp: 90 tablet, Rfl: 3 .  cholecalciferol (VITAMIN D) 1000 units tablet, Take 1,000 Units by mouth daily., Disp: , Rfl:  .  escitalopram (LEXAPRO) 10 MG tablet, Take 1.5 tablets (15 mg total) by mouth daily., Disp: 135 tablet, Rfl: 1 .  ferrous sulfate 325 (65 FE) MG tablet, TAKE 1 TABLET BY MOUTH TWICE A DAY, Disp: 60 tablet,  Rfl: 3 .  lamoTRIgine (LAMICTAL) 25 MG tablet, Take 1 tablet (25 mg total) by mouth 2 (two) times daily., Disp: 180 tablet, Rfl: 1 .  QUEtiapine (SEROQUEL) 50 MG tablet, Take 1 tablet (50 mg total) by mouth at bedtime., Disp: 90 tablet, Rfl: 1 .  rivastigmine (EXELON) 6 MG capsule, TAKE 1 CAPSULE TWICE DAILY, Disp: 180 capsule, Rfl: 1  EXAM: This was a telehealth telephone visit and no physical exam was completed  ASSESSMENT AND PLAN:  Discussed the following assessment and plan:  Essential hypertension - Plan: Comprehensive metabolic panel  Hyperlipidemia, unspecified hyperlipidemia type - Plan: Lipid panel  Iron deficiency - Plan: Iron, TIBC and Ferritin Panel, CBC  Overweight  Family history of prostate cancer in father - Plan: PSA, Medicare ( [place] only)  HTN (hypertension) Well-controlled.  Continue current regimen.  Hyperlipidemia Continue Lipitor.  He will come in for lab work.  Iron deficiency We will recheck his iron studies.  I did discuss that the Cologuard is not a perfect test and that it does not help evaluate anything other than colon polyps and possible colon cancers.  Discussed that to complete the evaluation he would need to have an endoscopy and colonoscopy to rule out any underlying lesions.  He notes he is still unable to arrange transportation for this.  Overweight Encouraged dietary changes and exercise.  Family history of prostate cancer in father PSA will be checked.  Front office staff will contact the patient and get him scheduled for follow-up in the office in 6 months again for lab work in the next 2 weeks.  Social distancing precautions and sick precautions given regarding COVID-19.   I discussed the assessment and treatment plan with the patient. The patient was provided an opportunity to ask questions and all were answered. The patient agreed with the plan and demonstrated an understanding of the instructions.   The patient was  advised to call back or seek an in-person evaluation if the symptoms worsen or if the condition fails to improve as anticipated.  I provided 23 minutes of non-face-to-face time during this encounter.   [name], MD

## 2019-03-21 ENCOUNTER — Ambulatory Visit (INDEPENDENT_AMBULATORY_CARE_PROVIDER_SITE_OTHER): Payer: Medicare Other | Admitting: Psychiatry

## 2019-03-21 ENCOUNTER — Encounter: Payer: Self-pay | Admitting: Psychiatry

## 2019-03-21 ENCOUNTER — Encounter: Payer: Self-pay | Admitting: Family Medicine

## 2019-03-21 ENCOUNTER — Ambulatory Visit (INDEPENDENT_AMBULATORY_CARE_PROVIDER_SITE_OTHER): Payer: Medicare Other

## 2019-03-21 ENCOUNTER — Encounter

## 2019-03-21 DIAGNOSIS — F09 Unspecified mental disorder due to known physiological condition: Secondary | ICD-10-CM

## 2019-03-21 DIAGNOSIS — Z0000 Encounter for general adult medical examination without abnormal findings: Secondary | ICD-10-CM

## 2019-03-21 DIAGNOSIS — F3181 Bipolar II disorder: Secondary | ICD-10-CM | POA: Diagnosis not present

## 2019-03-21 DIAGNOSIS — E663 Overweight: Secondary | ICD-10-CM | POA: Insufficient documentation

## 2019-03-21 MED ORDER — LAMOTRIGINE 25 MG PO TABS: 25.0000 mg | ORAL_TABLET | Freq: Two times a day (BID) | ORAL | 1 refills | Status: DC

## 2019-03-21 MED ORDER — RIVASTIGMINE TARTRATE 6 MG PO CAPS: 6.0000 mg | ORAL_CAPSULE | Freq: Two times a day (BID) | ORAL | 1 refills | Status: DC

## 2019-03-21 MED ORDER — ESCITALOPRAM OXALATE 10 MG PO TABS: 15.0000 mg | ORAL_TABLET | Freq: Every day | ORAL | 1 refills | Status: DC

## 2019-03-21 MED ORDER — QUETIAPINE FUMARATE 50 MG PO TABS: 50.0000 mg | ORAL_TABLET | Freq: Every day | ORAL | 1 refills | Status: DC
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
## 2019-03-21 NOTE — Assessment & Plan Note (Signed)
We will recheck his iron studies.  I did discuss that the Cologuard is not a perfect test and that it does not help evaluate anything other than colon polyps and possible colon cancers.  Discussed that to complete the evaluation he would need to have an endoscopy and colonoscopy to rule out any underlying lesions.  He notes he is still unable to arrange transportation for this.

## 2019-03-21 NOTE — Assessment & Plan Note (Signed)
Well-controlled.  Continue current regimen. 

## 2019-03-21 NOTE — Assessment & Plan Note (Signed)
Continue Lipitor.  He will come in for lab work.

## 2019-03-21 NOTE — Assessment & Plan Note (Signed)
PSA will be checked 

## 2019-03-21 NOTE — Progress Notes (Signed)
Subjective:   Danny Dieckhoff. is a 73 y.o. male who presents for Medicare Annual/Subsequent preventive examination.  Review of Systems:  No ROS.  Medicare Wellness Virtual Visit.  Visual/audio telehealth visit, UTA vital signs.   See social history for additional risk factors.   Cardiac Risk Factors include: advanced age (>4men, >26 women);hypertension;male gender     Objective:    Vitals: There were no vitals taken for this visit.  There is no height or weight on file to calculate BMI.  Advanced Directives 03/21/2019 11/21/2017 11/28/2016 11/21/2016 11/18/2016 09/07/2016 01/13/2016  Does Patient Have a Medical Advance Directive? No No No No No Yes No  Type of Advance Directive - - - - - Therapist, art in Chart? - - - - - No - copy requested -  Would patient like information on creating a medical advance directive? Yes (MAU/Ambulatory/Procedural Areas - Information given) No - Patient declined - - No - Patient declined - No - patient declined information  Some encounter information is confidential and restricted. Go to Review Flowsheets activity to see all data.    Tobacco Social History   Tobacco Use  Smoking Status Never Smoker  Smokeless Tobacco Never Used     Counseling given: Not Answered   Clinical Intake:  Pre-visit preparation completed: Yes        Diabetes: No  How often do you need to have someone help you when you read instructions, pamphlets, or other written materials from your doctor or pharmacy?: 1 - Never  Interpreter Needed?: No     Past Medical History:  Diagnosis Date  . Bipolar disorder (HCC)   . Chicken pox   . Glaucoma   . Heart murmur   . History of blood transfusion   . Hypertension   . Mumps   . Pelvis fracture [facility])    Past Surgical History:  Procedure Laterality Date  . ADENOIDECTOMY    . CATARACT EXTRACTION W/PHACO Right 01/13/2016   Procedure: CATARACT EXTRACTION PHACO  AND INTRAOCULAR LENS PLACEMENT (IOC);  Surgeon: Lockie Mola, MD;  Location[facility];  Service: Ophthalmology;  Laterality: Right;  . CATARACT EXTRACTION W/PHACO Left 09/07/2016   Procedure: CATARACT EXTRACTION PHACO AND INTRAOCULAR LENS PLACEMENT (IOC);  Surgeon: Lockie Mola, MD;  Location: [facility];  Service: Ophthalmology;  Laterality: Left;  PT WOULD LIKE LATER APPT  . CHOLECYSTECTOMY    . EYE SURGERY    . PENILE PROSTHESIS IMPLANT    . 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 Arthritis Mother   . Hypertension Mother   . Anxiety disorder Mother   . Depression Mother   . Lung cancer Mother   . Arthritis Father   . Hypertension Father   . Diabetes Father   . Lung cancer Father   . Prostate cancer Father   . Diabetes Sister   . Leukemia Sister    Social History   Socioeconomic History  . Marital status: Single    Spouse name: Not on file  . Number of children: Not on file  . Years of education: Not on file  . Highest education level: Not on file  Occupational History  . Not on file  Social Needs  . Financial resource strain: Not hard at all  . Food insecurity:    Worry: Never true  Inability: Never true  . Transportation needs:    Medical: No    Non-medical: No  Tobacco Use  . Smoking status: Never Smoker  . Smokeless tobacco: Never Used  Substance and Sexual Activity  . Alcohol use: No    Alcohol/week: 0.0 standard drinks  . Drug use: No  . Sexual activity: Not Currently  Lifestyle  . Physical activity:    Days per week: Not on file    Minutes per session: Not on file  . Stress: Not at all  Relationships  . Social connections:    Talks on phone: Not on file    Gets together: Not on file    Attends religious service: Not on file    Active member of club or organization: Not on file    Attends meetings of clubs or  organizations: Not on file    Relationship status: Not on file  Other Topics Concern  . Not on file  Social History Narrative  . Not on file    Outpatient Encounter Medications as of 03/21/2019  Medication Sig  . amLODipine (NORVASC) 10 MG tablet Take 1 tablet (10 mg total) by mouth daily.  . ASPIRIN 81 PO Take 81 mg by mouth daily.  Marland Kitchen. atorvastatin (LIPITOR) 40 MG tablet Take 1 tablet (40 mg total) by mouth daily.  . cholecalciferol (VITAMIN D) 1000 units tablet Take 1,000 Units by mouth daily.  Marland Kitchen. escitalopram (LEXAPRO) 10 MG tablet Take 1.5 tablets (15 mg total) by mouth daily.  Marland Kitchen. lamoTRIgine (LAMICTAL) 25 MG tablet Take 1 tablet (25 mg total) by mouth 2 (two) times daily.  . QUEtiapine (SEROQUEL) 50 MG tablet Take 1 tablet (50 mg total) by mouth at bedtime.  . rivastigmine (EXELON) 6 MG capsule TAKE 1 CAPSULE TWICE DAILY  . ferrous sulfate 325 (65 FE) MG tablet TAKE 1 TABLET BY MOUTH TWICE A DAY (Patient not taking: Reported on 03/21/2019)   No facility-administered encounter medications on file as of 03/21/2019.     Activities of Daily Living In your present state of health, do you have any difficulty performing the following activities: 03/21/2019  Hearing? N  Vision? N  Difficulty concentrating or making decisions? N  Walking or climbing stairs? Y  Comment Walking stick in use  Dressing or bathing? N  Doing errands, shopping? N  Preparing Food and eating ? N  Using the Toilet? N  In the past six months, have you accidently leaked urine? N  Do you have problems with loss of bowel control? N  Managing your Medications? N  Managing your Finances? N  Housekeeping or managing your Housekeeping? N  Some recent data might be hidden    Patient Care Team: Glori LuisSonnenberg, Eric G, MD as PCP - General (Family Medicine)   Assessment:   This is a routine wellness examination for Danny Mcgrath.  I connected with patient 03/21/19 at 11:30 AM EDT by a video/audio enabled telemedicine application and  verified that I am speaking with the correct person using two identifiers. Patient stated full name and DOB. Patient gave permission to continue with virtual visit. Patient's location was at home and Nurse's location was at [facility] office.   Health Screenings  Cologuard- 12/2016 Glaucoma -yes Hearing -demonstrates normal hearing during visit. Labs followed by pcp PSA- 10/2017 Dental- UTD Vision- visits within the last 12 months.  Social  Alcohol intake - no        Smoking history- never    Smokers in home? none Illicit drug use?  none Exercise - walking as toleratred Diet - regular Sexually Active -not currently BMI- discussed the importance of a healthy diet, water intake and the benefits of aerobic exercise.  Educational material provided.   Safety  Patient feels safe at home- yes Patient does have smoke detectors at home- yes Patient does wear sunscreen or protective clothing when in direct sunlight -yes Patient does wear seat belt when in a moving vehicle -yes  Covid-19 precautions and sickness symptoms discussed.   Activities of Daily Living Patient denies needing assistance with: driving, household chores, feeding themselves, getting from bed to chair, getting to the toilet, bathing/showering, dressing, managing money, or preparing meals.  No new identified risk were noted.    Depression Screen Patient denies losing interest in daily life, feeling hopeless, or crying easily over simple problems.   Medication-taking as directed and without issues.   Fall Screen Patient has fallen in the last 12 months.  He now uses a walking stick when ambulating for support. Followed by pcp.    Memory Screen Patient is alert.  Patient denies difficulty focusing, concentrating or misplacing items. Correctly identified the president of the [place], season and recall. Patient likes to draw designs, read and plays soltaire card  games for brain stimulation.  Immunizations The following  Immunizations were discussed: Influenza, shingles, pneumonia, and tetanus.   Other Providers Patient Care Team: Glori Luis, MD as PCP - General (Family Medicine)  Exercise Activities and Dietary recommendations Current Exercise Habits: Home exercise routine, Type of exercise: strength training/weights, Frequency (Times/Week): 7, Intensity: Moderate  Goals    . Follow up with Primary Care Provider     As needed       Fall Risk Fall Risk  03/21/2019 11/21/2017 11/18/2016  Falls in the past year? 1 Yes Yes  Number falls in past yr: 1 2 or more 2 or more  Comment Knee weakness. Followed by pcp.  - -  Injury with Fall? 0 No No  Risk Factor Category  - High Fall Risk -  Risk for fall due to : History of fall(s) History of fall(s) -  Follow up - Falls prevention discussed;Education provided -   Depression Screen PHQ 2/9 Scores 03/21/2019 11/21/2017 11/18/2016  PHQ - 2 Score 0 0 0    Cognitive Function MMSE - Mini Mental State Exam 11/18/2016  Orientation to time 5  Orientation to Place 5  Registration 3  Attention/ Calculation 5  Recall 3  Language- name 2 objects 2  Language- repeat 1  Language- follow 3 step command 3  Language- read & follow direction 1  Write a sentence 1  Copy design 1  Total score 30     6CIT Screen 03/21/2019 11/21/2017  What Year? 0 points 0 points  What month? 0 points 0 points  What time? 0 points 0 points  Count back from 20 0 points 0 points  Months in reverse 0 points 0 points  Repeat phrase - 0 points  Total Score - 0    Immunization History  Administered Date(s) Administered  . Influenza, High Dose Seasonal PF 11/18/2016, 08/04/2017  . Influenza,inj,Quad PF,6+ Mos 07/23/2015  . Pneumococcal Conjugate-13 11/19/2015  . Pneumococcal Polysaccharide-23 11/21/2017  . Tdap 11/19/2015   Screening Tests Health Maintenance  Topic Date Due  . INFLUENZA VACCINE  05/18/2019  . Fecal DNA (Cologuard)  12/28/2019  . TETANUS/TDAP  11/18/2025  .  Hepatitis C Screening  Completed  . PNA vac Low Risk Adult  Completed  Plan:    End of life planning; Advance aging; Advanced directives discussed.  Copy of current HCPOA/Living Will requested upon completion.    I have personally reviewed and noted the following in the patient's chart:   . Medical and social history . Use of alcohol, tobacco or illicit drugs  . Current medications and supplements . Functional ability and status . Nutritional status . Physical activity . Advanced directives . List of other physicians . Hospitalizations, surgeries, and ER visits in previous 12 months . Vitals . Screenings to include cognitive, depression, and falls . Referrals and appointments  In addition, I have reviewed and discussed with patient certain preventive protocols, quality metrics, and best practice recommendations. A written personalized care plan for preventive services as well as general preventive health recommendations were provided to patient.     Ashok Pall, LPN  10/25/1476

## 2019-03-21 NOTE — Assessment & Plan Note (Signed)
Encouraged dietary changes and exercise.

## 2019-03-21 NOTE — Patient Instructions (Addendum)
  Danny Mcgrath , Thank you for taking time to come for your Medicare Wellness Visit. I appreciate your ongoing commitment to your health goals. Please review the following plan we discussed and let me know if I can assist you in the future.   These are the goals we discussed: Goals    . Follow up with Primary Care Provider     As needed       This is a list of the screening recommended for you and due dates:  Health Maintenance  Topic Date Due  . Flu Shot  05/18/2019  . Cologuard (Stool DNA test)  12/28/2019  . Tetanus Vaccine  11/18/2025  .  Hepatitis C: One time screening is recommended by Center for Disease Control  (CDC) for  adults born from [birth year] through 1967.   Completed  . Pneumonia vaccines  Completed

## 2019-03-28 NOTE — Progress Notes (Signed)
I have reviewed the above note and agree.  [place], M.D.  

## 2019-04-01 ENCOUNTER — Telehealth: Payer: Self-pay

## 2019-04-01 NOTE — Telephone Encounter (Signed)
Copied from [place] [phone]. Topic: General - Other >> Mar 29, 2019  4:00 PM Stovall, Shana A wrote: Reason for CRM: Pt called in and has  few question about when is suppose to come to get blood test done and if she is suppose to be taking calcium ?   He thought that the was suppose to his iron checked and prostrate  Best number  [phone]  Called and left voicemail for pt to call into the office to schedule labs, labs are ordered.  Tangie Stay,cma

## 2019-04-03 ENCOUNTER — Other Ambulatory Visit: Payer: Medicare Other

## 2019-04-10 ENCOUNTER — Ambulatory Visit: Payer: Medicare Other | Admitting: Podiatry

## 2019-04-10 ENCOUNTER — Encounter: Payer: Self-pay | Admitting: Podiatry

## 2019-04-10 ENCOUNTER — Other Ambulatory Visit: Payer: Self-pay

## 2019-04-10 VITALS — Temp 97.2°F

## 2019-04-10 DIAGNOSIS — L600 Ingrowing nail: Secondary | ICD-10-CM | POA: Diagnosis not present

## 2019-04-10 MED ORDER — NEOMYCIN-POLYMYXIN-HC 1 % OT SOLN: OTIC | 1 refills | Status: DC

## 2019-04-10 NOTE — Patient Instructions (Signed)

## 2019-04-10 NOTE — Progress Notes (Signed)
He presents today would like to have toenails 1 bilateral and fifth left removed.  He states that they are painful and they need to be removed.  Objective: Vital signs are stable he is alert oriented x3 pulses are intact.  Capillary fill time is immediate.  Nails are thick yellow dystrophic painful on palpation with mild erythema surrounding the toe.  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  Plan: Chemical matrixectomy's were performed hallux bilaterally and fifth digit left.  Tolerated procedure well without complications with provided home-going instructions both orally and written also provided prescription for Cortisporin Otic to be applied twice daily after soaking.  We will follow-up with him in couple of weeks.

## 2019-04-11 ENCOUNTER — Other Ambulatory Visit (INDEPENDENT_AMBULATORY_CARE_PROVIDER_SITE_OTHER): Payer: Medicare Other

## 2019-04-11 ENCOUNTER — Other Ambulatory Visit: Payer: Self-pay

## 2019-04-11 DIAGNOSIS — I10 Essential (primary) hypertension: Secondary | ICD-10-CM

## 2019-04-11 DIAGNOSIS — E785 Hyperlipidemia, unspecified: Secondary | ICD-10-CM | POA: Diagnosis not present

## 2019-04-11 DIAGNOSIS — Z8042 Family history of malignant neoplasm of prostate: Secondary | ICD-10-CM

## 2019-04-11 DIAGNOSIS — E611 Iron deficiency: Secondary | ICD-10-CM

## 2019-04-11 LAB — COMPREHENSIVE METABOLIC PANEL
ALT: 10 U/L (ref 0–53)
AST: 15 U/L (ref 0–37)
Albumin: 4.4 g/dL (ref 3.5–5.2)
Alkaline Phosphatase: 97 U/L (ref 39–117)
BUN: 25 mg/dL — ABNORMAL HIGH (ref 6–23)
CO2: 27 mEq/L (ref 19–32)
Calcium: 9.3 mg/dL (ref 8.4–10.5)
Chloride: 105 mEq/L (ref 96–112)
Creatinine, Ser: 1.23 mg/dL (ref 0.40–1.50)
GFR: 57.39 mL/min — ABNORMAL LOW (ref >60.00)
Glucose, Bld: 98 mg/dL (ref 70–99)
Potassium: 4 mEq/L (ref 3.5–5.1)
Sodium: 140 mEq/L (ref 135–145)
Total Bilirubin: 1.2 mg/dL (ref 0.2–1.2)
Total Protein: 7.3 g/dL (ref 6.0–8.3)

## 2019-04-11 LAB — LIPID PANEL
Cholesterol: 162 mg/dL (ref 0–200)
HDL: 42.7 mg/dL (ref >39.00)
LDL Cholesterol: 99 mg/dL (ref 0–99)
NonHDL: 118.94
Total CHOL/HDL Ratio: 4
Triglycerides: 100 mg/dL (ref 0.0–149.0)
VLDL: 20 mg/dL (ref 0.0–40.0)

## 2019-04-11 LAB — PSA, MEDICARE: PSA: 0.53 ng/ml (ref 0.10–4.00)

## 2019-04-11 LAB — IBC + FERRITIN
Ferritin: 27.4 ng/mL (ref 22.0–322.0)
Iron: 93 ug/dL (ref 42–165)
Saturation Ratios: 22.7 % (ref 20.0–50.0)
Transferrin: 292 mg/dL (ref 212.0–360.0)

## 2019-04-11 LAB — CBC
HCT: 43.6 % (ref 39.0–52.0)
Hemoglobin: 15.1 g/dL (ref 13.0–17.0)
MCHC: 34.6 g/dL (ref 30.0–36.0)
MCV: 92.6 fl (ref 78.0–100.0)
Platelets: 152 10*3/uL (ref 150.0–400.0)
RBC: 4.7 Mil/uL (ref 4.22–5.81)
RDW: 13.8 % (ref 11.5–15.5)
WBC: 7.8 10*3/uL (ref 4.0–10.5)

## 2019-05-01 ENCOUNTER — Ambulatory Visit (INDEPENDENT_AMBULATORY_CARE_PROVIDER_SITE_OTHER): Payer: Medicare Other | Admitting: Podiatry

## 2019-05-01 ENCOUNTER — Encounter: Payer: Self-pay | Admitting: Podiatry

## 2019-05-01 ENCOUNTER — Other Ambulatory Visit: Payer: Self-pay

## 2019-05-01 VITALS — Temp 98.0°F

## 2019-05-01 DIAGNOSIS — L600 Ingrowing nail: Secondary | ICD-10-CM

## 2019-05-01 DIAGNOSIS — Z9889 Other specified postprocedural states: Secondary | ICD-10-CM

## 2019-05-01 NOTE — Patient Instructions (Signed)

## 2019-05-01 NOTE — Progress Notes (Signed)
He presents today for follow-up of his matrixectomy's hallux bilateral fifth digit left.  Dates that everything is doing great I have no problems whatsoever.  Objective: Vital signs are stable he is alert and oriented x3 total matrixectomy of the hallux bilaterally and of the fifth toe left foot appear to be healing very nicely.  No erythema edema cellulitis drainage or odor some granulation tissue with epithelialization is occurring to the hallux nail beds.  Assessment: Well-healing surgical toes hallux and fifth toe.  Plan: Continue to soak every other day Epson salts and warm water cover during the daytime leave open at nighttime.  Follow-up with me if not completely healed in 2 weeks

## 2019-06-25 ENCOUNTER — Other Ambulatory Visit: Payer: Self-pay

## 2019-06-25 ENCOUNTER — Ambulatory Visit (INDEPENDENT_AMBULATORY_CARE_PROVIDER_SITE_OTHER): Payer: Medicare Other | Admitting: Psychiatry

## 2019-06-25 ENCOUNTER — Encounter: Payer: Self-pay | Admitting: Psychiatry

## 2019-06-25 DIAGNOSIS — F3181 Bipolar II disorder: Secondary | ICD-10-CM | POA: Insufficient documentation

## 2019-06-25 DIAGNOSIS — F09 Unspecified mental disorder due to known physiological condition: Secondary | ICD-10-CM | POA: Diagnosis not present

## 2019-06-25 IMAGING — US US ABDOMEN LIMITED
1 series · 14 of 25 positions shown · non-contrast
Comparison: None.

CLINICAL DATA: Status post cholecystectomy. Elevated alkaline
phosphatase.

EXAM:
ULTRASOUND ABDOMEN LIMITED RIGHT UPPER QUADRANT

[Series 1: us abdomen limited · 0.23mm/px · 14 of 38 slices shown]
[im 1/38]
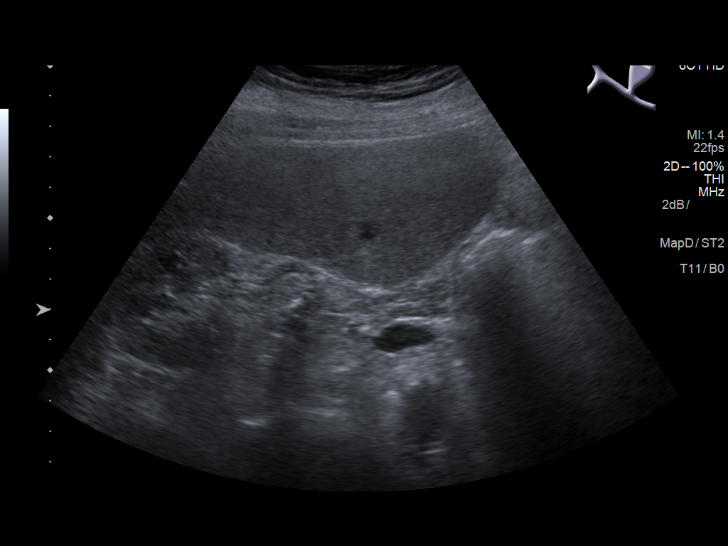
[im 4/38]
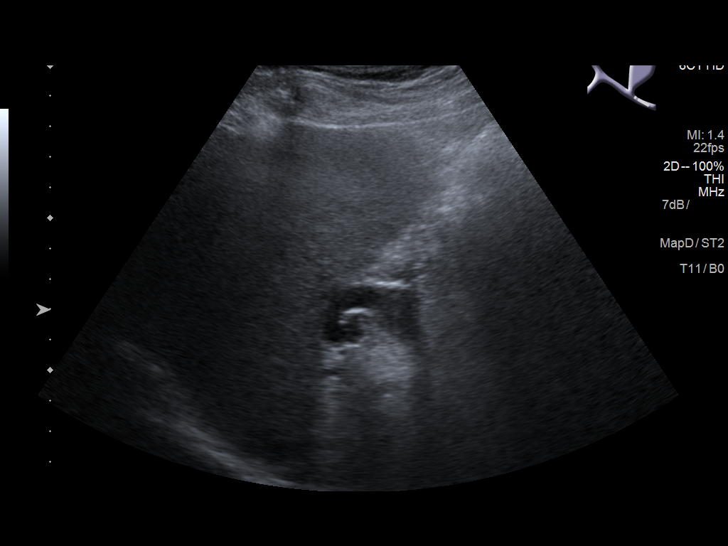
[im 7/38]
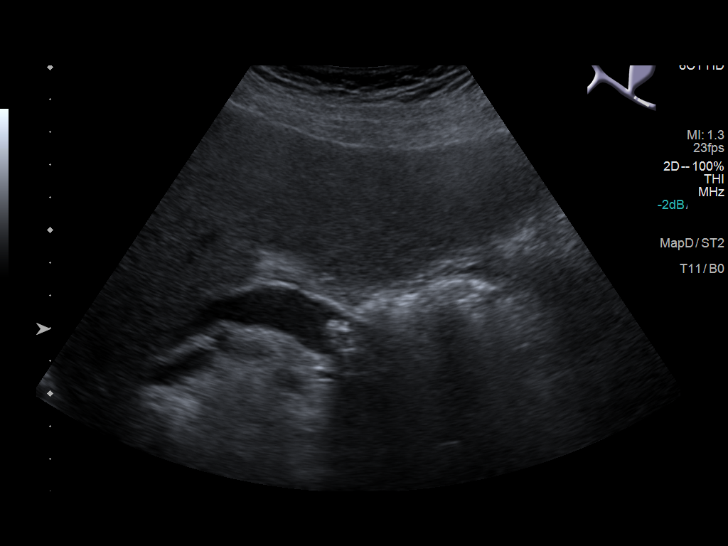
[im 10/38]
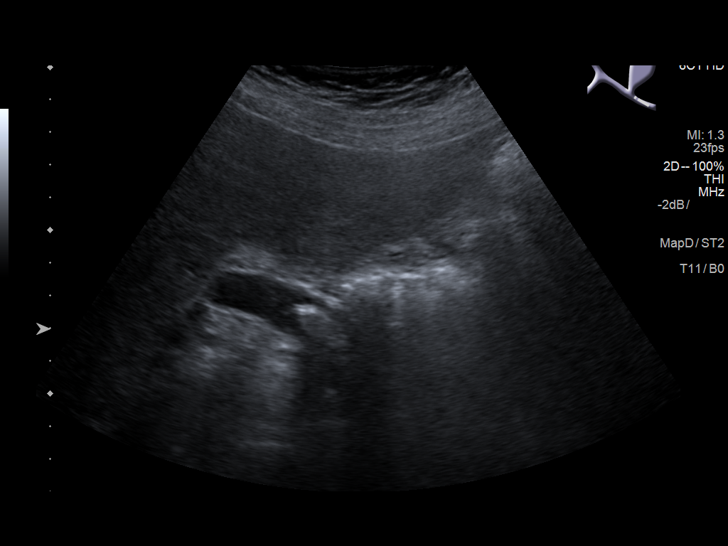
[im 13/38]
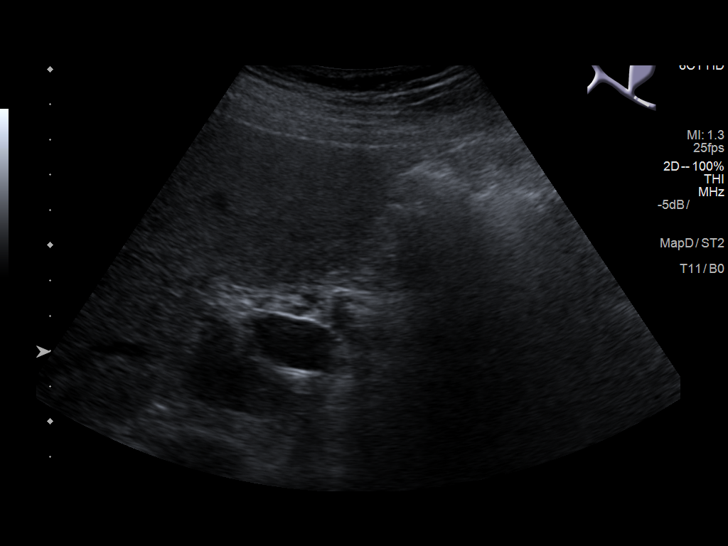
[im 14/38]
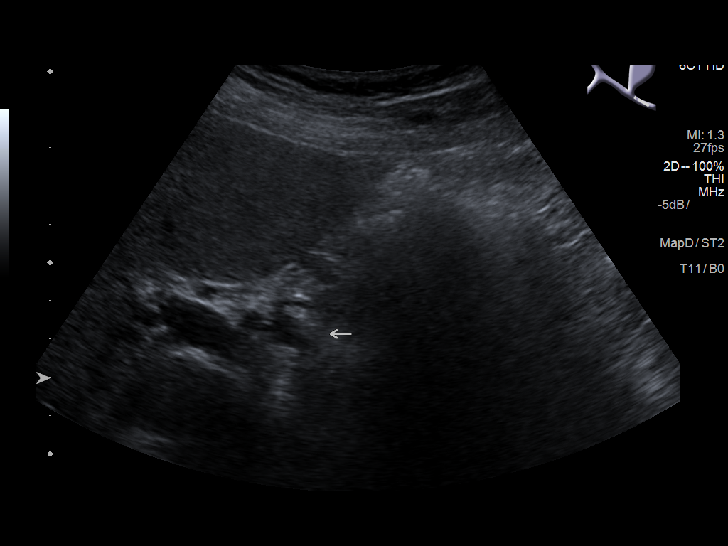
[im 17/38]
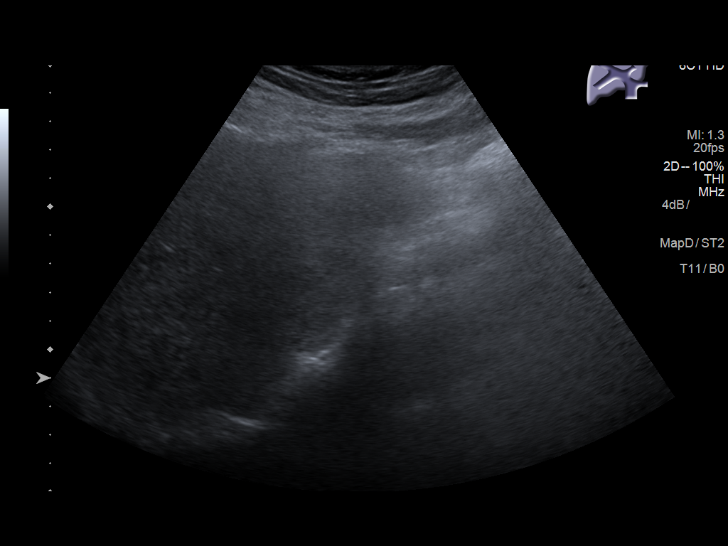
[im 21/38]
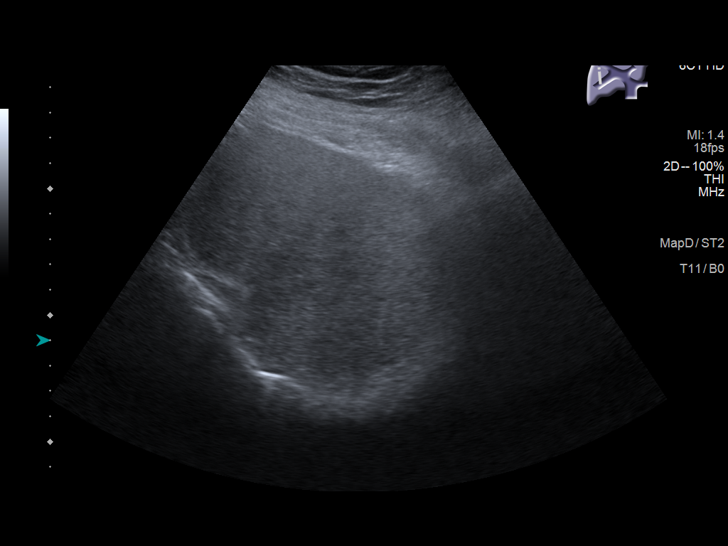
[im 24/38]
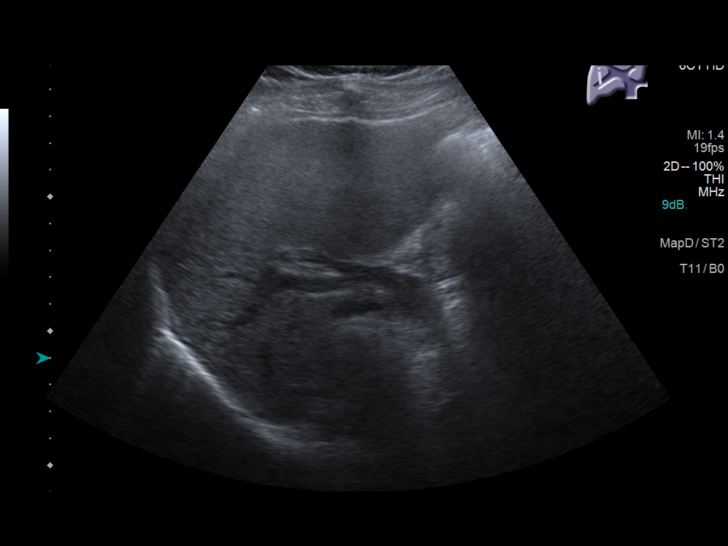
[im 25/38]
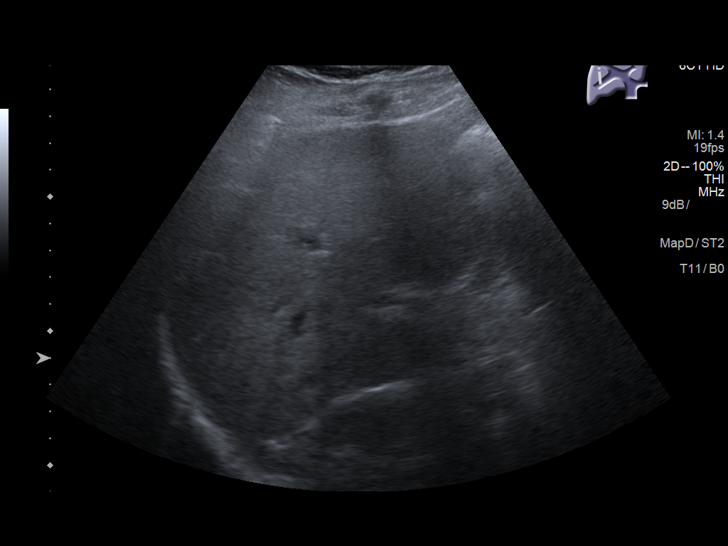
[im 28/38]
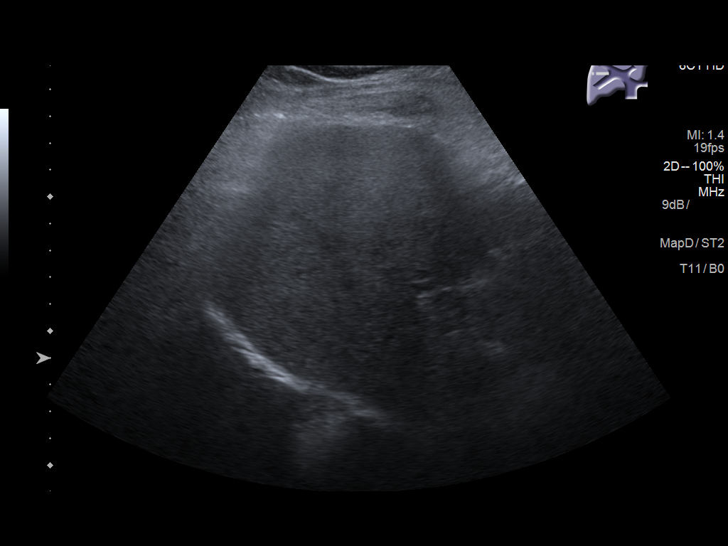
[im 31/38]
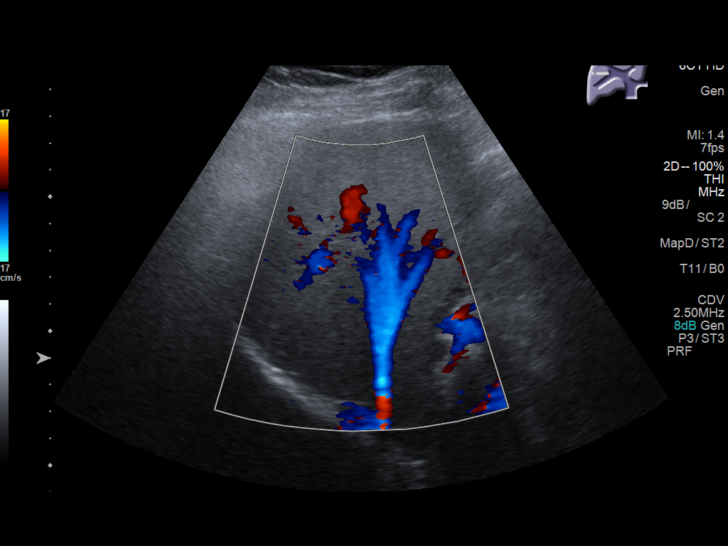
[im 34/38]
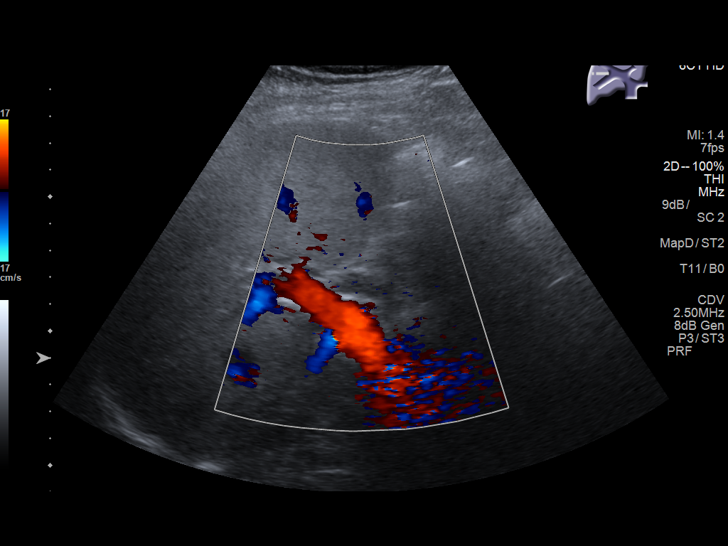
[im 38/38]
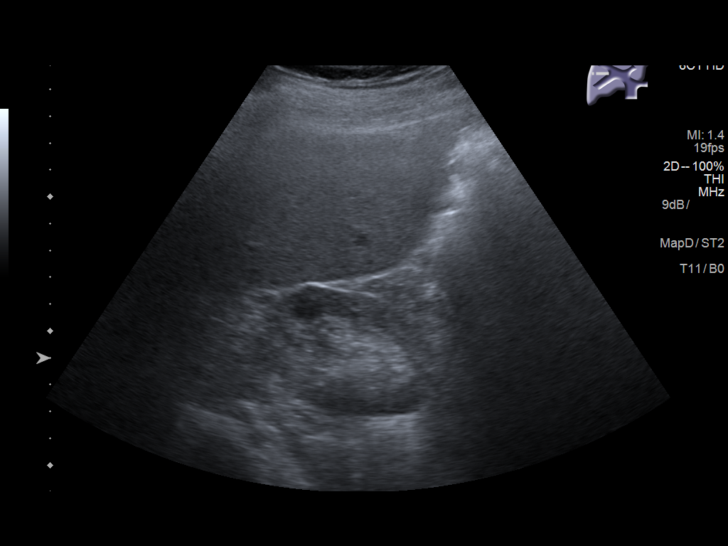

[14 of 25 positions shown; findings below may reference images not displayed]

FINDINGS: Gallbladder:

Surgically absent

Common bile duct:

Diameter: Distal duct is dilated. There is increased echogenicity in
the mid to distal duct consistent with a stone, measuring at least 1
cm in size. Duct measures 15 mm in diameter just proximal to the
stone.

Liver:

Increased parenchymal echogenicity. Liver normal in size. No mass or
focal lesion. Portal vein is patent on color Doppler imaging with
normal direction of blood flow towards the liver.
IMPRESSION: 1. Choledocholithiasis. Common bile duct is dilated to 15 mm. There
is a stone in the mid to distal duct. Recommend follow-up ERCP.
2. No other acute abnormality.
3. Increased liver echogenicity consistent with hepatic steatosis.

## 2019-06-25 NOTE — Progress Notes (Signed)
Virtual Visit via Telephone Note  I connected with Danny MathJoseph Hardenbrook Jr. on 06/25/19 at  2:00 PM EDT by telephone and verified that I am speaking with the correct person using two identifiers.   I discussed the limitations, risks, security and privacy concerns of performing an evaluation and management service by telephone and the availability of in person appointments. I also discussed with the patient that there may be a patient responsible charge related to this service. The patient expressed understanding and agreed to proceed.  I discussed the assessment and treatment plan with the patient. The patient was provided an opportunity to ask questions and all were answered. The patient agreed with the plan and demonstrated an understanding of the instructions.   The patient was advised to call back or seek an in-person evaluation if the symptoms worsen or if the condition fails to improve as anticipated.  BH MD OP Progress Note  06/25/2019 4:42 PM Danny MathJoseph Holwerda Jr.  MRN:  [id number]  Chief Complaint:  Chief Complaint    Follow-up     HPI: Danny Mcgrath is a 73 year old Caucasian male, divorced, lives in [place], [place], has a history of bipolar disorder, cognitive disorder was evaluated by phone today.  Patient preferred to do a phone call.  Patient today reports that he is anxious about the upcoming election.  He reports he had a fleeting thought few days ago when he thought about the election that he may just want to kill himself if Trump is reelected.  He however reports he does not have that kind of thought anymore.  He currently denies any suicidal thoughts, plan or intent.  He agrees to get help if he has any suicidal ideation in the future.  Patient reports he is compliant on medications as prescribed.  He reports he had appointments with Ms. Peacock few months ago however could not reschedule due to scheduling conflict.  Provided him Ms. Inetta Fermoina Thompson's information, therapist in the  community.  He will reach out to her.  Patient denies any other concerns today.  He appeared to be alert, oriented to person place time and situation.  Visit Diagnosis:    ICD-10-CM   1. Bipolar 2 disorder (HCC)  F31.81    major depressive disorder - stable  2. Mild cognitive disorder  F09     Past Psychiatric History: I have reviewed past psychiatric history from my progress note on 12/25/2017.  Past Medical History:  Past Medical History:  Diagnosis Date  . Bipolar disorder (HCC)   . Chicken pox   . Glaucoma   . Heart murmur   . History of blood transfusion   . Hypertension   . Mumps   . Pelvis fracture [facility](HCC)     Past Surgical History:  Procedure Laterality Date  . ADENOIDECTOMY    . CATARACT EXTRACTION W/PHACO Right 01/13/2016   Procedure: CATARACT EXTRACTION PHACO AND INTRAOCULAR LENS PLACEMENT (IOC);  Surgeon: Lockie Molahadwick Brasington, MD;  [facility];  Service: Ophthalmology;  Laterality: Right;  . CATARACT EXTRACTION W/PHACO Left 09/07/2016   Procedure: CATARACT EXTRACTION PHACO AND INTRAOCULAR LENS PLACEMENT (IOC);  Surgeon: Lockie Molahadwick Brasington, MD;  [facility];  Service: Ophthalmology;  Laterality: Left;  PT WOULD LIKE LATER APPT  . CHOLECYSTECTOMY    . EYE SURGERY    . PENILE PROSTHESIS IMPLANT    . 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 Arthritis Mother   . Hypertension Mother   . Anxiety disorder Mother   . Depression Mother   . Lung cancer Mother   . Arthritis Father   . Hypertension Father   . Diabetes Father   . Lung cancer Father   . Prostate cancer Father   . Diabetes Sister   . Leukemia Sister     Social History: I have reviewed social history from my progress note on 12/25/2017 Social History    Socioeconomic History  . Marital status: Single    Spouse name: Not on file  . Number of children: Not on file  . Years of education: Not on file  . Highest education level: Not on file  Occupational History  . Not on file  Social Needs  . Financial resource strain: Not hard at all  . Food insecurity    Worry: Never true    Inability: Never true  . Transportation needs    Medical: No    Non-medical: No  Tobacco Use  . Smoking status: Never Smoker  . Smokeless tobacco: Never Used  Substance and Sexual Activity  . Alcohol use: No    Alcohol/week: 0.0 standard drinks  . Drug use: No  . Sexual activity: Not Currently  Lifestyle  . Physical activity    Days per week: Not on file    Minutes per session: Not on file  . Stress: Not at all  Relationships  . Social Musicianconnections    Talks on phone: Not on file    Gets together: Not on file    Attends religious service: Not on file    Active member of club or organization: Not on file    Attends meetings of clubs or organizations: Not on file    Relationship status: Not on file  Other Topics Concern  . Not on file  Social History Narrative  . Not on file    Allergies: No Known Allergies  Metabolic Disorder Labs: Lab Results  Component Value Date   HGBA1C 5.6 05/23/2017   No results found for: PROLACTIN Lab Results  Component Value Date   CHOL 162 04/11/2019   TRIG 100.0 04/11/2019   HDL 42.70 04/11/2019   CHOLHDL 4 04/11/2019   VLDL 20.0 04/11/2019   LDLCALC 99 04/11/2019   LDLCALC 54 05/23/2017   No results found for: TSH  Therapeutic Level Labs: No results found for: LITHIUM No results found for: VALPROATE No components found for:  CBMZ  Current Medications: Current Outpatient Medications  Medication Sig Dispense Refill  . amLODipine (NORVASC) 10 MG tablet Take 1 tablet (10 mg total) by mouth daily. 90 tablet 3  . ASPIRIN 81 PO Take 81 mg by mouth daily.    Marland Kitchen. atorvastatin (LIPITOR) 40 MG tablet Take 1  tablet (40 mg total) by mouth daily. 90 tablet 3  . cholecalciferol (VITAMIN D) 1000 units tablet Take 1,000 Units by mouth daily.    Marland Kitchen. escitalopram (LEXAPRO) 10 MG tablet Take 1.5 tablets (15 mg total) by mouth daily. 135 tablet 1  . ferrous sulfate 325 (65 FE) MG tablet TAKE 1 TABLET BY MOUTH TWICE A DAY (Patient not taking: Reported on 03/21/2019) 60 tablet 3  . lamoTRIgine (LAMICTAL) 25 MG tablet Take 1 tablet (25 mg total) by mouth 2 (two) times daily. 180 tablet 1  . NEOMYCIN-POLYMYXIN-HYDROCORTISONE (CORTISPORIN) 1 %  SOLN OTIC solution Apply 1-2 drops to toe BID after soaking 10 mL 1  . QUEtiapine (SEROQUEL) 50 MG tablet Take 1 tablet (50 mg total) by mouth at bedtime. 90 tablet 1  . rivastigmine (EXELON) 6 MG capsule Take 1 capsule (6 mg total) by mouth 2 (two) times daily. 180 capsule 1   No current facility-administered medications for this visit.      Musculoskeletal: Strength & Muscle Tone: UTA Gait & Station: Walks with cane Patient leans: N/A  Psychiatric Specialty Exam: Review of Systems  Psychiatric/Behavioral: Negative for depression, hallucinations, substance abuse and suicidal ideas. The patient is not nervous/anxious.   All other systems reviewed and are negative.   There were no vitals taken for this visit.There is no height or weight on file to calculate BMI.  General Appearance: UTA  Eye Contact:  UTA  Speech:  Clear and Coherent  Volume:  Normal  Mood:  Euthymic  Affect:  UTA  Thought Process:  Goal Directed and Descriptions of Associations: Intact  Orientation:  Full (Time, Place, and Person)  Thought Content: Logical   Suicidal Thoughts:  No  Homicidal Thoughts:  No  Memory:  Immediate;   Fair Recent;   Fair Remote;   Fair  Judgement:  Fair  Insight:  Good  Psychomotor Activity:  UTA  Concentration:  Concentration: Fair and Attention Span: Fair  Recall:  [employer] of Knowledge: Fair  Language: Fair  Akathisia:  No  Handed:  Right  AIMS (if  indicated): Denies tremors, rigidity   Assets:  Communication Skills Desire for Improvement Social Support  ADL's:  Intact  Cognition: WNL  Sleep:  Fair   Screenings: Mini-Mental     [facility] Visit from 11/18/[facility] in [facility]  Total Score (max 30 points )  30    PHQ2-9     Clinical Support from 03/21/2019 in [facility] Clinical Support from 11/21/2017 in [facility] Office Visit from 11/18/2016 in [facility]  PHQ-2 Total Score  0  0  0       Assessment and Plan: Chayne is a 73 year old Caucasian male, has a history of bipolar disorder type II, cognitive disorder was evaluated by phone today.  Patient is currently stable on the current medication regimen.  Patient however is anxious about the pandemic as well as upcoming election and will benefit from psychotherapy sessions.  Will refer him for the same. Acute risk for suicide- his race, his gender, he is divorced, chronic mental health problems and recent suicidal thoughts. However the protective factors are that he currently denies any suicidality plan or intent, he is willing to get help, he is compliant on medications as well as with follow-up visits.  He is motivated to start psychotherapy sessions, does have social support system from friends.  Hence acute risk for suicide currently is low.  Crisis plan discussed with patient.  Plan Bipolar disorder type II-stable Seroquel 50 mg p.o. nightly Lexapro 15 mg p.o. daily Lamictal 25 mg p.o. twice daily Referral for CBT with Ms. Felecia Jan in the community.  We will also provide him the phone number so he can reach out.  Cognitive disorder, mild, multifactorial-stable Exelon as prescribed.  He will continue to follow-up with neurology.  Follow-up in clinic in 2 months or sooner if needed.  November 5 at 2 PM  I have spent atleast 15 minutes non face to face with patient today. More than 50 % of  the time  was spent for psychoeducation and supportive psychotherapy and care coordination. This note was generated in part or whole with voice recognition software. Voice recognition is usually quite accurate but there are transcription errors that can and very often do occur. I apologize for any typographical errors that were not detected and corrected.       Ursula Alert, MD 06/25/2019, 4:42 PM

## 2019-06-29 ENCOUNTER — Other Ambulatory Visit: Payer: Self-pay | Admitting: Podiatry

## 2019-07-01 ENCOUNTER — Telehealth: Payer: Self-pay

## 2019-07-01 NOTE — Telephone Encounter (Signed)
It may have been [place].

## 2019-07-01 NOTE — Telephone Encounter (Signed)
Copied from [place] [phone]. Topic: Quick Communication - See Telephone Encounter >> Jul 01, 2019  3:58 PM Loma Boston wrote: CRM for notification. See Telephone encounter for: 07/01/19. Pt said that someone had talked to him about a colonoscopy out front and he wanted to discuss it more, Delores or Geraldine. Fu with pt. He was told that Dr Caryl Bis would need to order but wants to specfically speak either of these 2 people. Is already down for cologuard .

## 2019-07-02 NOTE — Telephone Encounter (Signed)
I did not speak to this patient out front at all.  Nina,cma

## 2019-07-17 ENCOUNTER — Ambulatory Visit (INDEPENDENT_AMBULATORY_CARE_PROVIDER_SITE_OTHER): Payer: Medicare Other

## 2019-07-17 ENCOUNTER — Other Ambulatory Visit: Payer: Self-pay

## 2019-07-17 DIAGNOSIS — Z23 Encounter for immunization: Secondary | ICD-10-CM

## 2019-07-18 ENCOUNTER — Telehealth: Payer: Self-pay

## 2019-07-18 NOTE — Telephone Encounter (Signed)
He does not need another pneumonia vaccine.

## 2019-07-18 NOTE — Telephone Encounter (Signed)
Please find out if he has someone in mind that he is going to consider donating his kidney to. If he does it would be best to discuss this with the surgeon to see if he is a candidate, though given his hypertension, chronic kidney dysfunction, and unevaluated positive cologuard test he is likely not a candidate.

## 2019-07-18 NOTE — Telephone Encounter (Signed)
received fax that pt has appt for  07-18-19 Referral #  [id number]

## 2019-07-18 NOTE — Telephone Encounter (Signed)
Patient given advisement.  Patient said that he will take your advisement and doesn't plan to donate.    Patient wants to know if he needs to get the pneumonia vaccine.  I checked the chart.  Looks like patient doesn't need another pneumonia vaccine at this time.  Please advise.

## 2019-07-18 NOTE — Telephone Encounter (Signed)
Patient is interested in donating one of his kidneys and wanted to get Dr. Ellen Henri opinion based on his age if it would be ok to do.  Please advise.

## 2019-07-19 NOTE — Telephone Encounter (Signed)
Left detailed message on pt's voicemail advising that he does not need another pneumonia vaccine.  (Ok to leave v/m per DPR).

## 2019-08-22 ENCOUNTER — Encounter: Payer: Self-pay | Admitting: Psychiatry

## 2019-08-22 ENCOUNTER — Other Ambulatory Visit: Payer: Self-pay

## 2019-08-22 ENCOUNTER — Ambulatory Visit (INDEPENDENT_AMBULATORY_CARE_PROVIDER_SITE_OTHER): Payer: Medicare Other | Admitting: Psychiatry

## 2019-08-22 DIAGNOSIS — F3170 Bipolar disorder, currently in remission, most recent episode unspecified: Secondary | ICD-10-CM | POA: Diagnosis not present

## 2019-08-22 DIAGNOSIS — F09 Unspecified mental disorder due to known physiological condition: Secondary | ICD-10-CM

## 2019-08-22 NOTE — Progress Notes (Signed)
Virtual Visit via Telephone Note  I connected with Danny Math. on 08/22/19 at  2:00 PM EST by telephone and verified that I am speaking with the correct person using two identifiers.   I discussed the limitations, risks, security and privacy concerns of performing an evaluation and management service by telephone and the availability of in person appointments. I also discussed with the patient that there may be a patient responsible charge related to this service. The patient expressed understanding and agreed to proceed.      I discussed the assessment and treatment plan with the patient. The patient was provided an opportunity to ask questions and all were answered. The patient agreed with the plan and demonstrated an understanding of the instructions.   The patient was advised to call back or seek an in-person evaluation if the symptoms worsen or if the condition fails to improve as anticipated.   BH MD OP Progress Note  08/22/2019 2:15 PM Danny Math.  MRN:  [id number]  Chief Complaint:  Chief Complaint    Follow-up     HPI: Danny Mcgrath is a 73 year old Caucasian male, divorced, lives in [place], [place], has a history of bipolar disorder, cognitive disorder was evaluated by phone today.  Patient preferred to do a phone call.  Patient today reports he is currently making progress with regards to his mood symptoms.  He denies any significant anxiety or depressive symptoms at this time.  He reports he has started psychotherapy sessions with Ms. Felecia Jan and that has been very helpful.  He reports he has been able to discuss everything that has been bothering him for a while and his anxiety about certain topics have resolved since talking to his therapist.  He wants to continue to work with his therapist.  Patient reports sleep is good.  He denies any suicidality, homicidality or perceptual disturbances.  Patient appeared to be alert, oriented to person place time  and situation.  He is compliant on medications and denies side effects.  Patient denies any other concerns today. Visit Diagnosis:    ICD-10-CM   1. Bipolar disorder in remission (HCC)  F31.70   2. Mild cognitive disorder  F09     Past Psychiatric History: I have reviewed past psychiatric history from my progress note on 12/25/2017  Past Medical History:  Past Medical History:  Diagnosis Date  . Bipolar disorder (HCC)   . Chicken pox   . Glaucoma   . Heart murmur   . History of blood transfusion   . Hypertension   . Mumps   . Pelvis fracture [facility])     Past Surgical History:  Procedure Laterality Date  . ADENOIDECTOMY    . CATARACT EXTRACTION W/PHACO Right 01/13/2016   Procedure: CATARACT EXTRACTION PHACO AND INTRAOCULAR LENS PLACEMENT (IOC);  Surgeon: Lockie Mola, MD;  [facility];  Service: Ophthalmology;  Laterality: Right;  . CATARACT EXTRACTION W/PHACO Left 09/07/2016   Procedure: CATARACT EXTRACTION PHACO AND INTRAOCULAR LENS PLACEMENT (IOC);  Surgeon: Lockie Mola, MD;  [facility];  Service: Ophthalmology;  Laterality: Left;  PT WOULD LIKE LATER APPT  . CHOLECYSTECTOMY    . EYE SURGERY    . PENILE PROSTHESIS IMPLANT    . 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 Arthritis Mother   . Hypertension Mother   . Anxiety disorder Mother   . Depression Mother   . Lung cancer Mother   . Arthritis Father   . Hypertension Father   . Diabetes Father   . Lung cancer Father   . Prostate cancer Father   . Diabetes Sister   . Leukemia Sister     Social History: I have reviewed social history from my progress note on 12/25/2017 Social History   Socioeconomic History  . Marital status: Single    Spouse  name: Not on file  . Number of children: Not on file  . Years of education: Not on file  . Highest education level: Not on file  Occupational History  . Not on file  Social Needs  . Financial resource strain: Not hard at all  . Food insecurity    Worry: Never true    Inability: Never true  . Transportation needs    Medical: No    Non-medical: No  Tobacco Use  . Smoking status: Never Smoker  . Smokeless tobacco: Never Used  Substance and Sexual Activity  . Alcohol use: No    Alcohol/week: 0.0 standard drinks  . Drug use: No  . Sexual activity: Not Currently  Lifestyle  . Physical activity    Days per week: Not on file    Minutes per session: Not on file  . Stress: Not at all  Relationships  . Social Herbalist on phone: Not on file    Gets together: Not on file    Attends religious service: Not on file    Active member of club or organization: Not on file    Attends meetings of clubs or organizations: Not on file    Relationship status: Not on file  Other Topics Concern  . Not on file  Social History Narrative  . Not on file    Allergies: No Known Allergies  Metabolic Disorder Labs: Lab Results  Component Value Date   HGBA1C 5.6 05/23/2017   No results found for: PROLACTIN Lab Results  Component Value Date   CHOL 162 04/11/2019   TRIG 100.0 04/11/2019   HDL 42.70 04/11/2019   CHOLHDL 4 04/11/2019   VLDL 20.0 04/11/2019   LDLCALC 99 04/11/2019   LDLCALC 54 05/23/2017   No results found for: TSH  Therapeutic Level Labs: No results found for: LITHIUM No results found for: VALPROATE No components found for:  CBMZ  Current Medications: Current Outpatient Medications  Medication Sig Dispense Refill  . amLODipine (NORVASC) 10 MG tablet Take 1 tablet (10 mg total) by mouth daily. 90 tablet 3  . ASPIRIN 81 PO Take 81 mg by mouth daily.    Marland Kitchen atorvastatin (LIPITOR) 40 MG tablet Take 1 tablet (40 mg total) by mouth daily. 90 tablet 3  .  cholecalciferol (VITAMIN D) 1000 units tablet Take 1,000 Units by mouth daily.    Marland Kitchen escitalopram (LEXAPRO) 10 MG tablet Take 1.5 tablets (15 mg total) by mouth daily. 135 tablet 1  . ferrous sulfate 325 (65 FE) MG tablet TAKE 1 TABLET BY MOUTH TWICE A DAY (Patient not taking: Reported on 03/21/2019) 60 tablet 3  . lamoTRIgine (LAMICTAL) 25 MG tablet Take 1 tablet (25 mg total) by mouth 2 (two) times daily. 180 tablet 1  . NEOMYCIN-POLYMYXIN-HYDROCORTISONE (CORTISPORIN) 1 % SOLN OTIC solution Apply 1-2 drops to toe BID after soaking 10 mL 1  . QUEtiapine (SEROQUEL) 50  MG tablet Take 1 tablet (50 mg total) by mouth at bedtime. 90 tablet 1  . rivastigmine (EXELON) 6 MG capsule Take 1 capsule (6 mg total) by mouth 2 (two) times daily. 180 capsule 1   No current facility-administered medications for this visit.      Musculoskeletal: Strength & Muscle Tone: UTA Gait & Station: Reports as WNL Patient leans: N/A  Psychiatric Specialty Exam: Review of Systems  Psychiatric/Behavioral: Negative for depression, hallucinations, substance abuse and suicidal ideas. The patient is not nervous/anxious and does not have insomnia.   All other systems reviewed and are negative.   There were no vitals taken for this visit.There is no height or weight on file to calculate BMI.  General Appearance: UTA  Eye Contact:  UTA  Speech:  Clear and Coherent  Volume:  Normal  Mood:  Euthymic  Affect:  UTA  Thought Process:  Goal Directed and Descriptions of Associations: Intact  Orientation:  Full (Time, Place, and Person)  Thought Content: Logical   Suicidal Thoughts:  No  Homicidal Thoughts:  No  Memory:  Immediate;   Fair Recent;   Fair Remote;   Fair  Judgement:  Fair  Insight:  Fair  Psychomotor Activity:  UTA  Concentration:  Concentration: Fair and Attention Span: Fair  Recall:  [employer]  Fund of Knowledge: Fair  Language: Fair  Akathisia:  No  Handed:  Right  AIMS (if indicated): Denies tremors,  rigidity  Assets:  Communication Skills Housing Social Support  ADL's:  Intact  Cognition: WNL  Sleep:  Fair   Screenings: Mini-Mental     Office Visit from 11/18/2016 in [facility]  Total Score (max 30 points )  30    PHQ2-9     Clinical Support from [facility]/2019 in [facility] Clinical [facility] Office Visit from 11/18/2016 in [facility]  PHQ-2 Total Score  0  0  0       Assessment and Plan: Jomarie LongsJoseph is a 73 year old Caucasian male who has a history of bipolar disorder type II, cognitive disorder was evaluated by phone today.  Patient is currently doing well and continues to be motivated to stay in psychotherapy sessions as well as medications.  Plan Bipolar disorder in remission Seroquel 50 mg p.o. nightly Lexapro 15 mg p.o. daily Lamotrigine 25 mg p.o. twice daily Continue psychotherapy sessions with Ms. Felecia Janina Thompson.  Cognitive disorder-mild-multifactorial-stable Exelon as prescribed.  He will continue to work with his neurologist.  Follow-up in clinic in 2 months or sooner if needed.  January 7 at 2 PM  I have spent atleast 10 minutes non face to face with patient today. More than 50 % of the time was spent for psychoeducation and supportive psychotherapy and care coordination. This note was generated in part or whole with voice recognition software. Voice recognition is usually quite accurate but there are transcription errors that can and very often do occur. I apologize for any typographical errors that were not detected and corrected.       Jomarie LongsSaramma Ernesha Ramone, MD 08/22/2019, 2:15 PM

## 2019-09-14 ENCOUNTER — Other Ambulatory Visit: Payer: Self-pay | Admitting: Psychiatry

## 2019-09-14 DIAGNOSIS — F3181 Bipolar II disorder: Secondary | ICD-10-CM

## 2019-09-30 ENCOUNTER — Other Ambulatory Visit: Payer: Self-pay | Admitting: Psychiatry

## 2019-09-30 DIAGNOSIS — F09 Unspecified mental disorder due to known physiological condition: Secondary | ICD-10-CM

## 2019-10-21 ENCOUNTER — Ambulatory Visit: Payer: Medicare Other | Admitting: Family Medicine

## 2019-10-21 ENCOUNTER — Ambulatory Visit (INDEPENDENT_AMBULATORY_CARE_PROVIDER_SITE_OTHER): Payer: Medicare Other | Admitting: Family Medicine

## 2019-10-21 ENCOUNTER — Encounter: Payer: Self-pay | Admitting: Family Medicine

## 2019-10-21 ENCOUNTER — Other Ambulatory Visit: Payer: Self-pay

## 2019-10-21 DIAGNOSIS — E611 Iron deficiency: Secondary | ICD-10-CM

## 2019-10-21 DIAGNOSIS — E785 Hyperlipidemia, unspecified: Secondary | ICD-10-CM

## 2019-10-21 DIAGNOSIS — I10 Essential (primary) hypertension: Secondary | ICD-10-CM

## 2019-10-21 DIAGNOSIS — F203 Undifferentiated schizophrenia: Secondary | ICD-10-CM | POA: Diagnosis not present

## 2019-10-21 DIAGNOSIS — W19XXXA Unspecified fall, initial encounter: Secondary | ICD-10-CM

## 2019-10-21 NOTE — Progress Notes (Signed)
Virtual Visit via telephone Note  This visit type was conducted due to national recommendations for restrictions regarding the COVID-19 pandemic (e.g. social distancing).  This format is felt to be most appropriate for this patient at this time.  All issues noted in this document were discussed and addressed.  No physical exam was performed (except for noted visual exam findings with Video Visits).   I connected with Danny Mcgrath today at  3:30 PM EST by telephone and verified that I am speaking with the correct person using two identifiers. Location patient: home Location provider: work Persons participating in the virtual visit: patient, provider  I discussed the limitations, risks, security and privacy concerns of performing an evaluation and management service by telephone and the availability of in person appointments. I also discussed with the patient that there may be a patient responsible charge related to this service. The patient expressed understanding and agreed to proceed.  Interactive audio and video telecommunications were attempted between this provider and patient, however failed, due to patient having technical difficulties OR patient did not have access to video capability.  We continued and completed visit with audio only.   Reason for visit: follow-up  HPI: Falls: Notes he has fallen 2-3 times.  No injury with either of them.  One of them he reached out and hit his hand and fell.  The second time he slipped on something.  Hypertension: Typically 130/80.  Taking amlodipine.  No chest pain, shortness of breath, or edema.  Hyperlipidemia: Taking Lipitor.  No right upper arm pain or myalgias.  Iron deficiency: Has not been evaluated by GI for this.  He is ready to try to get this set up.  Anxiety/depression: He notes no symptoms related to this.  He continues on medication with psychiatry.  No SI or HI.   ROS: See pertinent positives and negatives per HPI.  Past  Medical History:  Diagnosis Date  . Bipolar disorder ([place])   . Chicken pox   . Glaucoma   . Heart murmur   . History of blood transfusion   . Hypertension   . Mumps   . Pelvis fracture [facility])     Past Surgical History:  Procedure Laterality Date  . ADENOIDECTOMY    . CATARACT EXTRACTION W/PHACO Right 01/13/2016   Procedure: CATARACT EXTRACTION PHACO AND INTRAOCULAR LENS PLACEMENT (IOC);  Surgeon: Leandrew Koyanagi, MD;  Location: [place];  Service: Ophthalmology;  Laterality: Right;  . CATARACT EXTRACTION W/PHACO Left 09/07/2016   Procedure: CATARACT EXTRACTION PHACO AND INTRAOCULAR LENS PLACEMENT (IOC);  Surgeon: Leandrew Koyanagi, MD;  Location: [place];  Service: Ophthalmology;  Laterality: Left;  PT WOULD LIKE LATER APPT  . CHOLECYSTECTOMY    . EYE SURGERY    . PENILE PROSTHESIS IMPLANT    . 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 Arthritis Mother   . Hypertension Mother   . Anxiety disorder Mother   . Depression Mother   . Lung cancer Mother   . Arthritis Father   . Hypertension Father   . Diabetes Father   . Lung cancer Father   . Prostate cancer Father   . Diabetes Sister   . Leukemia Sister     SOCIAL HX: Non-smoker   Current Outpatient Medications:  .  amLODipine (NORVASC) 10 MG tablet, Take 1 tablet (10 mg total) by mouth daily.,  Disp: 90 tablet, Rfl: 3 .  ASPIRIN 81 PO, Take 81 mg by mouth daily., Disp: , Rfl:  .  atorvastatin (LIPITOR) 40 MG tablet, Take 1 tablet (40 mg total) by mouth daily., Disp: 90 tablet, Rfl: 3 .  cholecalciferol (VITAMIN D) 1000 units tablet, Take 1,000 Units by mouth daily., Disp: , Rfl:  .  escitalopram (LEXAPRO) 10 MG tablet, TAKE 1 AND 1/2 TABLETS     (15MG  TOTAL) DAILY, Disp: 135 tablet, Rfl: 1 .  ferrous sulfate 325 (65 FE) MG tablet, TAKE 1 TABLET BY MOUTH TWICE A DAY, Disp: 60 tablet, Rfl: 3 .   lamoTRIgine (LAMICTAL) 25 MG tablet, TAKE 1 TABLET TWICE A DAY, Disp: 180 tablet, Rfl: 1 .  NEOMYCIN-POLYMYXIN-HYDROCORTISONE (CORTISPORIN) 1 % SOLN OTIC solution, Apply 1-2 drops to toe BID after soaking, Disp: 10 mL, Rfl: 1 .  QUEtiapine (SEROQUEL) 50 MG tablet, Take 1 tablet (50 mg total) by mouth at bedtime., Disp: 90 tablet, Rfl: 1 .  rivastigmine (EXELON) 6 MG capsule, TAKE 1 CAPSULE TWICE DAILY, Disp: 180 capsule, Rfl: 1  EXAM:  This was a telehealth telephone visit and thus no physical exam was completed.  ASSESSMENT AND PLAN:  Discussed the following assessment and plan:  HTN (hypertension) Adequately controlled.  Continue current regimen.  Undifferentiated schizophrenia (HCC) Stable.  No anxiety or depression issues.  Continue his current regimen through psychiatry.  Iron deficiency Refer back to GI.  We will work on arranging transportation.  Hyperlipidemia Continue Lipitor.  Falls No injuries.  Discussed falls precautions.   Orders Placed This Encounter  Procedures  . Ambulatory referral to Gastroenterology    Referral Priority:   Routine    Referral Type:   Consultation    Referral Reason:   Specialty Services Required    Number of Visits Requested:   1    No orders of the defined types were placed in this encounter.    I discussed the assessment and treatment plan with the patient. The patient was provided an opportunity to ask questions and all were answered. The patient agreed with the plan and demonstrated an understanding of the instructions.   The patient was advised to call back or seek an in-person evaluation if the symptoms worsen or if the condition fails to improve as anticipated.  I provided 21 minutes of non-face-to-face time during this encounter.   , MD

## 2019-10-22 ENCOUNTER — Ambulatory Visit: Payer: Medicare Other

## 2019-10-24 ENCOUNTER — Ambulatory Visit: Payer: Medicare Other | Admitting: Psychiatry

## 2019-10-24 DIAGNOSIS — W19XXXA Unspecified fall, initial encounter: Secondary | ICD-10-CM | POA: Insufficient documentation

## 2019-10-24 NOTE — Assessment & Plan Note (Signed)
Stable.  No anxiety or depression issues.  Continue his current regimen through psychiatry.

## 2019-10-24 NOTE — Assessment & Plan Note (Signed)
-  Continue Lipitor °

## 2019-10-24 NOTE — Assessment & Plan Note (Signed)
No injuries.  Discussed falls precautions.

## 2019-10-24 NOTE — Assessment & Plan Note (Signed)
Refer back to GI.  We will work on arranging transportation.

## 2019-10-24 NOTE — Assessment & Plan Note (Signed)
Adequately controlled.  Continue current regimen. 

## 2019-10-28 ENCOUNTER — Other Ambulatory Visit: Payer: Self-pay

## 2019-10-28 ENCOUNTER — Ambulatory Visit (INDEPENDENT_AMBULATORY_CARE_PROVIDER_SITE_OTHER): Payer: Medicare Other | Admitting: Psychiatry

## 2019-10-28 ENCOUNTER — Encounter: Payer: Self-pay | Admitting: Psychiatry

## 2019-10-28 DIAGNOSIS — F3176 Bipolar disorder, in full remission, most recent episode depressed: Secondary | ICD-10-CM

## 2019-10-28 DIAGNOSIS — F09 Unspecified mental disorder due to known physiological condition: Secondary | ICD-10-CM | POA: Diagnosis not present

## 2019-10-28 DIAGNOSIS — F3170 Bipolar disorder, currently in remission, most recent episode unspecified: Secondary | ICD-10-CM | POA: Insufficient documentation

## 2019-10-28 MED ORDER — QUETIAPINE FUMARATE 50 MG PO TABS: 50.0000 mg | ORAL_TABLET | Freq: Every day | ORAL | 1 refills | Status: DC

## 2019-10-28 NOTE — Progress Notes (Signed)
Virtual Visit via Telephone Note  I connected with Danny Math. on 10/28/19 at  4:20 PM EST by telephone and verified that I am speaking with the correct person using two identifiers.   I discussed the limitations, risks, security and privacy concerns of performing an evaluation and management service by telephone and the availability of in person appointments. I also discussed with the patient that there may be a patient responsible charge related to this service. The patient expressed understanding and agreed to proceed.     I discussed the assessment and treatment plan with the patient. The patient was provided an opportunity to ask questions and all were answered. The patient agreed with the plan and demonstrated an understanding of the instructions.   The patient was advised to call back or seek an in-person evaluation if the symptoms worsen or if the condition fails to improve as anticipated.   BH MD OP Progress Note  10/28/2019 3:16 PM Danny Math.  MRN:  [id number]  Chief Complaint:  Chief Complaint    Follow-up     HPI: [name] is a 74 year old Caucasian male, divorced, lives in [place], has a history of bipolar disorder, cognitive disorder was evaluated by phone today.  Patient preferred to do a phone call.  Patient today reports he is currently doing well on the current medication regimen.  Patient reports mood symptoms are stable.  He however reports he did have some anxiety due to the current political situation.  He however has been coping okay.  He reports he continues to work with his therapist Ms. Felecia Jan.  He reports he is sleeping okay.  Patient reports he continues to struggle with some memory problems however overall he has been doing okay.  Patient denies any suicidality, homicidality or perceptual disturbances.  Patient denies any other concerns today.    Visit Diagnosis:    ICD-10-CM   1. Bipolar disorder, in full remission, most  recent episode depressed (HCC)  F31.76 QUEtiapine (SEROQUEL) 50 MG tablet  2. Mild cognitive disorder  F09     Past Psychiatric History: I have reviewed past psychiatric history from my progress note on 12/25/2017  Past Medical History:  Past Medical History:  Diagnosis Date  . Bipolar disorder (HCC)   . Chicken pox   . Glaucoma   . Heart murmur   . History of blood transfusion   . Hypertension   . Mumps   . Pelvis fracture [facility])     Past Surgical History:  Procedure Laterality Date  . ADENOIDECTOMY    . CATARACT EXTRACTION W/PHACO Right 01/13/2016   Procedure: CATARACT EXTRACTION PHACO AND INTRAOCULAR LENS PLACEMENT (IOC);  Surgeon: Lockie Mola, MD;  [facility];  Service: Ophthalmology;  Laterality: Right;  . CATARACT EXTRACTION W/PHACO Left 09/07/2016   Procedure: CATARACT EXTRACTION PHACO AND INTRAOCULAR LENS PLACEMENT (IOC);  Surgeon: Lockie Mola, MD;  [facility];  Service: Ophthalmology;  Laterality: Left;  PT WOULD LIKE LATER APPT  . CHOLECYSTECTOMY    . EYE SURGERY    . PENILE PROSTHESIS IMPLANT    . 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 Arthritis Mother   . Hypertension Mother   . Anxiety disorder Mother   . Depression Mother   . Lung  cancer Mother   . Arthritis Father   . Hypertension Father   . Diabetes Father   . Lung cancer Father   . Prostate cancer Father   . Diabetes Sister   . Leukemia Sister     Social History: Reviewed social history from my progress note on 12/25/2017 Social History   Socioeconomic History  . Marital status: Single    Spouse name: Not on file  . Number of children: Not on file  . Years of education: Not on file  . Highest education level: Not on file   Occupational History  . Not on file  Tobacco Use  . Smoking status: Never Smoker  . Smokeless tobacco: Never Used  Substance and Sexual Activity  . Alcohol use: No    Alcohol/week: 0.0 standard drinks  . Drug use: No  . Sexual activity: Not Currently  Other Topics Concern  . Not on file  Social History Narrative  . Not on file   Social Determinants of Health   Financial Resource Strain: Low Risk   . Difficulty of Paying Living Expenses: Not hard at all  Food Insecurity: No Food Insecurity  . Worried About Charity fundraiser in the Last Year: Never true  . Ran Out of Food in the Last Year: Never true  Transportation Needs:   . Lack of Transportation (Medical): Not on file  . Lack of Transportation (Non-Medical): Not on file  Physical Activity:   . Days of Exercise per Week: Not on file  . Minutes of Exercise per Session: Not on file  Stress: No Stress Concern Present  . Feeling of Stress : Not at all  Social Connections:   . Frequency of Communication with Friends and Family: Not on file  . Frequency of Social Gatherings with Friends and Family: Not on file  . Attends Religious Services: Not on file  . Active Member of Clubs or Organizations: Not on file  . Attends Archivist Meetings: Not on file  . Marital Status: Not on file    Allergies: No Known Allergies  Metabolic Disorder Labs: Lab Results  Component Value Date   HGBA1C 5.6 05/23/2017   No results found for: PROLACTIN Lab Results  Component Value Date   CHOL 162 04/11/2019   TRIG 100.0 04/11/2019   HDL 42.70 04/11/2019   CHOLHDL 4 04/11/2019   VLDL 20.0 04/11/2019   LDLCALC 99 04/11/2019   LDLCALC 54 05/23/2017   No results found for: TSH  Therapeutic Level Labs: No results found for: LITHIUM No results found for: VALPROATE No components found for:  CBMZ  Current Medications: Current Outpatient Medications  Medication Sig Dispense Refill  . amLODipine (NORVASC) 10 MG tablet Take 1  tablet (10 mg total) by mouth daily. 90 tablet 3  . ASPIRIN 81 PO Take 81 mg by mouth daily.    Marland Kitchen atorvastatin (LIPITOR) 40 MG tablet Take 1 tablet (40 mg total) by mouth daily. 90 tablet 3  . cholecalciferol (VITAMIN D) 1000 units tablet Take 1,000 Units by mouth daily.    Marland Kitchen escitalopram (LEXAPRO) 10 MG tablet TAKE 1 AND 1/2 TABLETS     (15MG  TOTAL) DAILY 338 tablet 1  . ferrous sulfate 325 (65 FE) MG tablet TAKE 1 TABLET BY MOUTH TWICE A DAY 60 tablet 3  . lamoTRIgine (LAMICTAL) 25 MG tablet TAKE 1 TABLET TWICE A DAY 180 tablet 1  . NEOMYCIN-POLYMYXIN-HYDROCORTISONE (CORTISPORIN) 1 % SOLN OTIC solution Apply 1-2 drops to toe BID after  soaking 10 mL 1  . QUEtiapine (SEROQUEL) 50 MG tablet Take 1 tablet (50 mg total) by mouth at bedtime. 90 tablet 1  . rivastigmine (EXELON) 6 MG capsule TAKE 1 CAPSULE TWICE DAILY 180 capsule 1   No current facility-administered medications for this visit.     Musculoskeletal: Strength & Muscle Tone: UTA Gait & Station: Reports as WNL Patient leans: N/A  Psychiatric Specialty Exam: Review of Systems  Psychiatric/Behavioral: Negative for agitation, behavioral problems, confusion, decreased concentration, dysphoric mood, hallucinations, self-injury, sleep disturbance and suicidal ideas. The patient is not nervous/anxious and is not hyperactive.   All other systems reviewed and are negative.   There were no vitals taken for this visit.There is no height or weight on file to calculate BMI.  General Appearance: UTA  Eye Contact:  UTA  Speech:  Clear and Coherent  Volume:  Normal  Mood:  Euthymic  Affect:  UTA  Thought Process:  Goal Directed and Descriptions of Associations: Intact  Orientation:  Full (Time, Place, and Person)  Thought Content: Logical   Suicidal Thoughts:  No  Homicidal Thoughts:  No  Memory:  Immediate;   Fair Recent;   Fair Remote;   Fair  Judgement:  Fair  Insight:  Fair  Psychomotor Activity:  UTA  Concentration:   Concentration: Fair and Attention Span: Fair  Recall:  [employer] of Knowledge: Fair  Language: Fair  Akathisia:  No  Handed:  Right  AIMS (if indicated): Denies tremors, rigidity  Assets:  Communication Skills Desire for Improvement Housing  ADL's:  Intact  Cognition: WNL  Sleep:  Fair   Screenings: Mini-Mental     [facility] Visit from 11/18/2016 in [facility]  Total Score (max 30 points )  30    PHQ2-9     Office Visit from 10/21/2019 in [facility] Clinical Support from 03/21/2019 in [facility] Clinical Support from 11/21/2017 in [facility] Office Visit from 11/18/2016 in [facility] [place]  PHQ-2 Total Score  0  0  0  0       Assessment and Plan: Sherrel is a 74 year old Caucasian male who has a history of bipolar disorder type II, cognitive disorder was evaluated by phone today.  Patient is currently doing well and continues to be compliant with psychotherapy sessions as well as medications.  Plan as noted below.  Plan Bipolar disorder in remission Seroquel 50 mg p.o. nightly. Lexapro 15 mg p.o. daily. Lamotrigine 25 mg p.o. twice daily. Continue psychotherapy sessions with Ms. Felecia Jan.  Cognitive disorder-mild-multifactorial-stable Exelon as prescribed.  He will continue to work with neurology.  Follow-up in clinic in 3 months or sooner if needed.  April 15 at 2:40 PM  I have spent atleast 20 minutes non face to face with patient today. More than 50 % of the time was spent for  ordering medications and test ,psychoeducation and supportive psychotherapy and care coordination,as well as documenting clinical information in electronic health record. This note was generated in part or whole with voice recognition software. Voice recognition is usually quite accurate but there are transcription errors that can and very often do occur. I apologize for any typographical errors that were not  detected and corrected.       Jomarie Longs, MD 10/28/2019, 3:16 PM

## 2019-10-31 ENCOUNTER — Encounter: Payer: Self-pay | Admitting: *Deleted

## 2019-11-18 ENCOUNTER — Telehealth: Payer: Self-pay | Admitting: Psychiatry

## 2019-11-18 NOTE — Telephone Encounter (Signed)
Patient had Seroquel sent out on 10/28/2019.  Left message for patient.

## 2019-12-02 ENCOUNTER — Telehealth: Payer: Self-pay

## 2019-12-02 NOTE — Telephone Encounter (Signed)
pt called states she needs refills on her medications sent to the [facility] in [place]

## 2019-12-03 ENCOUNTER — Telehealth: Payer: Self-pay

## 2019-12-03 DIAGNOSIS — F3176 Bipolar disorder, in full remission, most recent episode depressed: Secondary | ICD-10-CM

## 2019-12-03 DIAGNOSIS — F09 Unspecified mental disorder due to known physiological condition: Secondary | ICD-10-CM

## 2019-12-03 DIAGNOSIS — F3181 Bipolar II disorder: Secondary | ICD-10-CM

## 2019-12-03 MED ORDER — LAMOTRIGINE 25 MG PO TABS: 25.0000 mg | ORAL_TABLET | Freq: Two times a day (BID) | ORAL | 1 refills | Status: DC

## 2019-12-03 MED ORDER — ESCITALOPRAM OXALATE 10 MG PO TABS: ORAL_TABLET | ORAL | 1 refills | Status: DC

## 2019-12-03 MED ORDER — QUETIAPINE FUMARATE 50 MG PO TABS: 50.0000 mg | ORAL_TABLET | Freq: Every day | ORAL | 1 refills | Status: DC

## 2019-12-03 MED ORDER — RIVASTIGMINE TARTRATE 6 MG PO CAPS: 6.0000 mg | ORAL_CAPSULE | Freq: Two times a day (BID) | ORAL | 1 refills | Status: DC

## 2019-12-03 NOTE — Telephone Encounter (Signed)
pt called states that he still does not understand why his rx has not come in yet. pt gave me the pharmacy phone number he had which was  [phone].  Told pt I would call and find out

## 2019-12-03 NOTE — Telephone Encounter (Signed)
pt was called and given information. pt was given the phone number that i was given and he was going to call .  Pending pt to call me back

## 2019-12-03 NOTE — Telephone Encounter (Signed)
I spoke to this patient earlier today and he said he was confused that quetiapine is a the same as quetiapine fumarate and that he was ok .

## 2019-12-03 NOTE — Telephone Encounter (Signed)
called the pharamcy they told me that they did not recieve his rx anymore that i would have to call [employer] at [phone].and they could not find pt in system    .

## 2019-12-03 NOTE — Telephone Encounter (Signed)
pt called states that his pharmacy has changed to [facility] and that he wil need all his medications to go to that pharmacy. told patient i would change pharmacy in the system and send message to send rx.

## 2019-12-03 NOTE — Telephone Encounter (Signed)
Sent all [facility] medications to [facility].

## 2019-12-03 NOTE — Telephone Encounter (Signed)
Returned call to patient.  Discussed with him all his medications were sent out for 6 months recently and he does not need refills yet.  Patient reports he did not get his Seroquel held by pharmacy yet.  Discussed with him that it was sent out on 10/28/2019 for 6 months.  Patient advised to contact [facility].

## 2020-01-06 ENCOUNTER — Ambulatory Visit: Payer: Medicare Other | Admitting: Gastroenterology

## 2020-01-06 ENCOUNTER — Encounter: Payer: Self-pay | Admitting: Gastroenterology

## 2020-01-06 DIAGNOSIS — D509 Iron deficiency anemia, unspecified: Secondary | ICD-10-CM

## 2020-01-30 ENCOUNTER — Other Ambulatory Visit: Payer: Self-pay

## 2020-01-30 ENCOUNTER — Telehealth (INDEPENDENT_AMBULATORY_CARE_PROVIDER_SITE_OTHER): Payer: Medicare Other | Admitting: Psychiatry

## 2020-01-30 ENCOUNTER — Encounter: Payer: Self-pay | Admitting: Psychiatry

## 2020-01-30 DIAGNOSIS — F09 Unspecified mental disorder due to known physiological condition: Secondary | ICD-10-CM | POA: Diagnosis not present

## 2020-01-30 DIAGNOSIS — F3176 Bipolar disorder, in full remission, most recent episode depressed: Secondary | ICD-10-CM

## 2020-01-30 NOTE — Progress Notes (Signed)
Provider Location : [facility] Patient Location : Home  Virtual Visit via Telephone Note  I connected with Danny Mcgrath. on 01/30/20 at  2:40 PM EDT by telephone and verified that I am speaking with the correct person using two identifiers.   I discussed the limitations, risks, security and privacy concerns of performing an evaluation and management service by telephone and the availability of in person appointments. I also discussed with the patient that there may be a patient responsible charge related to this service. The patient expressed understanding and agreed to proceed.      I discussed the assessment and treatment plan with the patient. The patient was provided an opportunity to ask questions and all were answered. The patient agreed with the plan and demonstrated an understanding of the instructions.   The patient was advised to call back or seek an in-person evaluation if the symptoms worsen or if the condition fails to improve as anticipated.   [place] MD OP Progress Note  01/30/2020 3:23 PM Danny Mcgrath.  MRN:  [id number]  Chief Complaint:  Chief Complaint    Follow-up     HPI: Danny Mcgrath is a 74 year old Caucasian male, divorced, lives in [place], has a history of cognitive disorder, bipolar disorder in remission was evaluated by phone today.  Patient preferred to do a phone call.  Patient today reports he is currently stable on current medication regimen.  Patient denies any suicidality, homicidality or perceptual disturbances.  He continues to struggle with memory problems however currently they are stable.  He continues to be compliant with all his medications.  He denies side effects to medications.  He reports sleep and appetite as fair.  He continues to follow-up with Ms. Miguel Dibble.  Reports therapy sessions as beneficial.  He reports he got his COVID-19 vaccine and tolerated it well.  Patient denies any other concerns today. Visit Diagnosis:     ICD-10-CM   1. Bipolar disorder, in full remission, most recent episode depressed ([place])  F31.76   2. Mild cognitive disorder  F09     Past Psychiatric History: I have reviewed past psychiatric history from my progress note on 12/25/2017.  Past Medical History:  Past Medical History:  Diagnosis Date  . Bipolar disorder ([place])   . Chicken pox   . Glaucoma   . Heart murmur   . History of blood transfusion   . Hypertension   . Mumps   . Pelvis fracture [facility])     Past Surgical History:  Procedure Laterality Date  . ADENOIDECTOMY    . CATARACT EXTRACTION W/PHACO Right 01/13/2016   Procedure: CATARACT EXTRACTION PHACO AND INTRAOCULAR LENS PLACEMENT (IOC);  Surgeon: Leandrew Koyanagi, MD;  Location: [place];  Service: Ophthalmology;  Laterality: Right;  . CATARACT EXTRACTION W/PHACO Left 09/07/2016   Procedure: CATARACT EXTRACTION PHACO AND INTRAOCULAR LENS PLACEMENT (IOC);  Surgeon: Leandrew Koyanagi, MD;  Location: [place];  Service: Ophthalmology;  Laterality: Left;  PT WOULD LIKE LATER APPT  . CHOLECYSTECTOMY    . EYE SURGERY    . PENILE PROSTHESIS IMPLANT    . 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 Arthritis Mother   . Hypertension Mother   . Anxiety disorder Mother   . Depression Mother   .  Lung cancer Mother   . Arthritis Father   . Hypertension Father   . Diabetes Father   . Lung cancer Father   . Prostate cancer Father   . Diabetes Sister   . Leukemia Sister     Social History: Reviewed social history from my progress note on 12/25/2017 Social History   Socioeconomic History  . Marital status: Single    Spouse name: Not on file  . Number of children: Not on file  . Years of education: Not on file  . Highest  education level: Not on file  Occupational History  . Not on file  Tobacco Use  . Smoking status: Never Smoker  . Smokeless tobacco: Never Used  Substance and Sexual Activity  . Alcohol use: No    Alcohol/week: 0.0 standard drinks  . Drug use: No  . Sexual activity: Not Currently  Other Topics Concern  . Not on file  Social History Narrative  . Not on file   Social Determinants of Health   Financial Resource Strain: Low Risk   . Difficulty of Paying Living Expenses: Not hard at all  Food Insecurity: No Food Insecurity  . Worried About Programme researcher, broadcasting/film/video in the Last Year: Never true  . Ran Out of Food in the Last Year: Never true  Transportation Needs:   . Lack of Transportation (Medical):   Marland Kitchen Lack of Transportation (Non-Medical):   Physical Activity:   . Days of Exercise per Week:   . Minutes of Exercise per Session:   Stress: No Stress Concern Present  . Feeling of Stress : Not at all  Social Connections:   . Frequency of Communication with Friends and Family:   . Frequency of Social Gatherings with Friends and Family:   . Attends Religious Services:   . Active Member of Clubs or Organizations:   . Attends Banker Meetings:   Marland Kitchen Marital Status:     Allergies: No Known Allergies  Metabolic Disorder Labs: Lab Results  Component Value Date   HGBA1C 5.6 05/23/2017   No results found for: PROLACTIN Lab Results  Component Value Date   CHOL 162 04/11/2019   TRIG 100.0 04/11/2019   HDL 42.70 04/11/2019   CHOLHDL 4 04/11/2019   VLDL 20.0 04/11/2019   LDLCALC 99 04/11/2019   LDLCALC 54 05/23/2017   No results found for: TSH  Therapeutic Level Labs: No results found for: LITHIUM No results found for: VALPROATE No components found for:  CBMZ  Current Medications: Current Outpatient Medications  Medication Sig Dispense Refill  . amLODipine (NORVASC) 10 MG tablet Take 1 tablet (10 mg total) by mouth daily. 90 tablet 3  . ASPIRIN 81 PO Take 81 mg by  mouth daily.    Marland Kitchen atorvastatin (LIPITOR) 40 MG tablet Take 1 tablet (40 mg total) by mouth daily. 90 tablet 3  . cholecalciferol (VITAMIN D) 1000 units tablet Take 1,000 Units by mouth daily.    Marland Kitchen escitalopram (LEXAPRO) 10 MG tablet TAKE 1 AND 1/2 TABLETS     (15MG  TOTAL) DAILY 135 tablet 1  . ferrous sulfate 325 (65 FE) MG tablet TAKE 1 TABLET BY MOUTH TWICE A DAY 60 tablet 3  . lamoTRIgine (LAMICTAL) 25 MG tablet Take 1 tablet (25 mg total) by mouth 2 (two) times daily. 180 tablet 1  . NEOMYCIN-POLYMYXIN-HYDROCORTISONE (CORTISPORIN) 1 % SOLN OTIC solution Apply 1-2 drops to toe BID after soaking 10 mL 1  . QUEtiapine (SEROQUEL) 50 MG tablet Take 1  tablet (50 mg total) by mouth at bedtime. 90 tablet 1  . rivastigmine (EXELON) 6 MG capsule Take 1 capsule (6 mg total) by mouth 2 (two) times daily. 180 capsule 1   No current facility-administered medications for this visit.     Musculoskeletal: Strength & Muscle Tone: UTA Gait & Station: UTA Patient leans: N/A  Psychiatric Specialty Exam: Review of Systems  Psychiatric/Behavioral: Negative for agitation, behavioral problems, confusion, decreased concentration, dysphoric mood, hallucinations, self-injury, sleep disturbance and suicidal ideas. The patient is not nervous/anxious and is not hyperactive.   All other systems reviewed and are negative.   There were no vitals taken for this visit.There is no height or weight on file to calculate BMI.  General Appearance: UTA  Eye Contact:  UTA  Speech:  Clear and Coherent  Volume:  Normal  Mood:  Euthymic  Affect:  UTA  Thought Process:  Goal Directed and Descriptions of Associations: Intact  Orientation:  Full (Time, Place, and Person)  Thought Content: Logical   Suicidal Thoughts:  No  Homicidal Thoughts:  No  Memory:  Immediate;   Fair Recent;   Fair Remote;   Fair  Judgement:  Fair  Insight:  Fair  Psychomotor Activity:  UTA  Concentration:  Concentration: Fair and Attention Span:  Fair  Recall:  [employer] of Knowledge: Fair  Language: Fair  Akathisia:  No  Handed:  Right  AIMS (if indicated): UTA  Assets:  Communication Skills Desire for Improvement Housing  ADL's:  Intact  Cognition: WNL  Sleep:  Fair   Screenings: Mini-Mental     Office Visit from 11/18/2016 in [facility]  Total Score (max 30 points )  30    PHQ2-9     Office Visit from 10/21/2019 in [facility] Clinical Support from 03/21/2019 in [facility] Clinical Support from 11/21/2017 in [facility] Office Visit from 11/18/2016 in [facility]  PHQ-2 Total Score  0  0  0  0       Assessment and Plan: Garlon is a 74 year old Caucasian male, lives in [place], has a history of cognitive disorder, bipolar disorder was evaluated by phone today.  Patient is currently stable on current medication regimen.  He will continue to benefit from psychotherapy sessions and medication management.  Plan as noted below.  Plan Bipolar disorder in remission Seroquel 50 mg p.o. nightly Lexapro 15 mg p.o. daily. Lamotrigine 25 mg p.o. twice daily. Continue psychotherapy sessions with Ms. Felecia Jan.  Cognitive disorder, mild-multifactorial-stable Exelon as prescribed.  Continue to follow-up with neurology.  Follow-up in clinic in 3 months or sooner if needed.  I have spent atleast 19 minutes non face to face with patient today. More than 50 % of the time was spent for ordering medications and test ,psychoeducation and supportive psychotherapy and care coordination,as well as documenting clinical information in electronic health record. This note was generated in part or whole with voice recognition software. Voice recognition is usually quite accurate but there are transcription errors that can and very often do occur. I apologize for any typographical errors that were not detected and corrected.       Jomarie Longs, MD 01/30/2020, 3:23 PM

## 2020-03-17 ENCOUNTER — Encounter: Payer: Self-pay | Admitting: *Deleted

## 2020-03-17 ENCOUNTER — Ambulatory Visit: Payer: Medicare Other | Admitting: Gastroenterology

## 2020-03-23 ENCOUNTER — Ambulatory Visit: Payer: Medicare Other

## 2020-03-24 ENCOUNTER — Ambulatory Visit: Payer: Medicare Other

## 2020-04-03 ENCOUNTER — Ambulatory Visit (INDEPENDENT_AMBULATORY_CARE_PROVIDER_SITE_OTHER): Payer: Medicare Other

## 2020-04-03 VITALS — BP 122/83 | Ht 70.0 in | Wt 196.0 lb

## 2020-04-03 DIAGNOSIS — Z0000 Encounter for general adult medical examination without abnormal findings: Secondary | ICD-10-CM

## 2020-04-03 NOTE — Patient Instructions (Addendum)
Danny Mcgrath , Thank you for taking time to come for your Medicare Wellness Visit. I appreciate your ongoing commitment to your health goals. Please review the following plan we discussed and let me know if I can assist you in the future.   These are the goals we discussed: Goals    . Follow up with Primary Care Provider     As needed       This is a list of the screening recommended for you and due dates:  Health Maintenance  Topic Date Due  . Cologuard (Stool DNA test)  12/28/2019  . Flu Shot  05/17/2020  . Tetanus Vaccine  11/18/2025  . COVID-19 Vaccine  Completed  . Pneumonia vaccines  Completed  .  Hepatitis C: One time screening is recommended by Center for Disease Control  (CDC) for  adults born from [birth year] through 1967.   Addressed    Immunization History  Administered Date(s) Administered  . Fluad Quad(high Dose 65+) 07/17/2019  . Influenza, High Dose Seasonal PF 11/18/2016, 08/04/2017  . Influenza,inj,Quad PF,6+ Mos 07/23/2015  . PFIZER SARS-COV-2 Vaccination 12/18/2019, 02/08/2020  . Pneumococcal Conjugate-13 11/19/2015  . Pneumococcal Polysaccharide-23 11/21/2017  . Tdap 11/19/2015   Screening Tests Health Maintenance  Topic Date Due  . Fecal DNA (Cologuard)  12/28/2019  . INFLUENZA VACCINE  05/17/2020  . TETANUS/TDAP  11/18/2025  . COVID-19 Vaccine  Completed  . PNA vac Low Risk Adult  Completed  . Hepatitis C Screening  Addressed   Keep all routine maintenance appointments.   Follow up 04/21/20 @ 13:30  Advanced directives: declined  Conditions/risks identified: none  Follow up in one year for your annual wellness visit.   Preventive Care 68 Years and Older, Male Preventive care refers to lifestyle choices and visits with your health care provider that can promote health and wellness. What does preventive care include?  A yearly physical exam. This is also called an annual well check.  Dental exams once or twice a year.  Routine eye exams. Ask  your health care provider how often you should have your eyes checked.  Personal lifestyle choices, including:  Daily care of your teeth and gums.  Regular physical activity.  Eating a healthy diet.  Avoiding tobacco and drug use.  Limiting alcohol use.  Practicing safe sex.  Taking low doses of aspirin every day.  Taking vitamin and mineral supplements as recommended by your health care provider. What happens during an annual well check? The services and screenings done by your health care provider during your annual well check will depend on your age, overall health, lifestyle risk factors, and family history of disease. Counseling  Your health care provider may ask you questions about your:  Alcohol use.  Tobacco use.  Drug use.  Emotional well-being.  Home and relationship well-being.  Sexual activity.  Eating habits.  History of falls.  Memory and ability to understand (cognition).  Work and work Statistician. Screening  You may have the following tests or measurements:  Height, weight, and BMI.  Blood pressure.  Lipid and cholesterol levels. These may be checked every 5 years, or more frequently if you are over 47 years old.  Skin check.  Lung cancer screening. You may have this screening every year starting at age 82 if you have a 30-pack-year history of smoking and currently smoke or have quit within the past 15 years.  Fecal occult blood test (FOBT) of the stool. You may have this test every year starting  at age 50.  Flexible sigmoidoscopy or colonoscopy. You may have a sigmoidoscopy every 5 years or a colonoscopy every 10 years starting at age 73.  Prostate cancer screening. Recommendations will vary depending on your family history and other risks.  Hepatitis C blood test.  Hepatitis B blood test.  Sexually transmitted disease (STD) testing.  Diabetes screening. This is done by checking your blood sugar (glucose) after you have not eaten  for a while (fasting). You may have this done every 1-3 years.  Abdominal aortic aneurysm (AAA) screening. You may need this if you are a current or former smoker.  Osteoporosis. You may be screened starting at age 67 if you are at high risk. Talk with your health care provider about your test results, treatment options, and if necessary, the need for more tests. Vaccines  Your health care provider may recommend certain vaccines, such as:  Influenza vaccine. This is recommended every year.  Tetanus, diphtheria, and acellular pertussis (Tdap, Td) vaccine. You may need a Td booster every 10 years.  Zoster vaccine. You may need this after age 60.  Pneumococcal 13-valent conjugate (PCV13) vaccine. One dose is recommended after age 45.  Pneumococcal polysaccharide (PPSV23) vaccine. One dose is recommended after age 65. Talk to your health care provider about which screenings and vaccines you need and how often you need them. This information is not intended to replace advice given to you by your health care provider. Make sure you discuss any questions you have with your health care provider. Document Released: 10/30/2015 Document Revised: 06/22/2016 Document Reviewed: 08/04/2015 Elsevier Interactive Patient Education  2017 [employer].  Fall Prevention in the Home Falls can cause injuries. They can happen to people of all ages. There are many things you can do to make your home safe and to help prevent falls. What can I do on the outside of my home?  Regularly fix the edges of walkways and driveways and fix any cracks.  Remove anything that might make you trip as you walk through a door, such as a raised step or threshold.  Trim any bushes or trees on the path to your home.  Use bright outdoor lighting.  Clear any walking paths of anything that might make someone trip, such as rocks or tools.  Regularly check to see if handrails are loose or broken. Make sure that both sides of any  steps have handrails.  Any raised decks and porches should have guardrails on the edges.  Have any leaves, snow, or ice cleared regularly.  Use sand or salt on walking paths during winter.  Clean up any spills in your garage right away. This includes oil or grease spills. What can I do in the bathroom?  Use night lights.  Install grab bars by the toilet and in the tub and shower. Do not use towel bars as grab bars.  Use non-skid mats or decals in the tub or shower.  If you need to sit down in the shower, use a plastic, non-slip stool.  Keep the floor dry. Clean up any water that spills on the floor as soon as it happens.  Remove soap buildup in the tub or shower regularly.  Attach bath mats securely with double-sided non-slip rug tape.  Do not have throw rugs and other things on the floor that can make you trip. What can I do in the bedroom?  Use night lights.  Make sure that you have a light by your bed that is easy  to reach.  Do not use any sheets or blankets that are too big for your bed. They should not hang down onto the floor.  Have a firm chair that has side arms. You can use this for support while you get dressed.  Do not have throw rugs and other things on the floor that can make you trip. What can I do in the kitchen?  Clean up any spills right away.  Avoid walking on wet floors.  Keep items that you use a lot in easy-to-reach places.  If you need to reach something above you, use a strong step stool that has a grab bar.  Keep electrical cords out of the way.  Do not use floor polish or wax that makes floors slippery. If you must use wax, use non-skid floor wax.  Do not have throw rugs and other things on the floor that can make you trip. What can I do with my stairs?  Do not leave any items on the stairs.  Make sure that there are handrails on both sides of the stairs and use them. Fix handrails that are broken or loose. Make sure that handrails are  as long as the stairways.  Check any carpeting to make sure that it is firmly attached to the stairs. Fix any carpet that is loose or worn.  Avoid having throw rugs at the top or bottom of the stairs. If you do have throw rugs, attach them to the floor with carpet tape.  Make sure that you have a light switch at the top of the stairs and the bottom of the stairs. If you do not have them, ask someone to add them for you. What else can I do to help prevent falls?  Wear shoes that:  Do not have high heels.  Have rubber bottoms.  Are comfortable and fit you well.  Are closed at the toe. Do not wear sandals.  If you use a stepladder:  Make sure that it is fully opened. Do not climb a closed stepladder.  Make sure that both sides of the stepladder are locked into place.  Ask someone to hold it for you, if possible.  Clearly mark and make sure that you can see:  Any grab bars or handrails.  First and last steps.  Where the edge of each step is.  Use tools that help you move around (mobility aids) if they are needed. These include:  Canes.  Walkers.  Scooters.  Crutches.  Turn on the lights when you go into a dark area. Replace any light bulbs as soon as they burn out.  Set up your furniture so you have a clear path. Avoid moving your furniture around.  If any of your floors are uneven, fix them.  If there are any pets around you, be aware of where they are.  Review your medicines with your doctor. Some medicines can make you feel dizzy. This can increase your chance of falling. Ask your doctor what other things that you can do to help prevent falls. This information is not intended to replace advice given to you by your health care provider. Make sure you discuss any questions you have with your health care provider. Document Released: 07/30/2009 Document Revised: 03/10/2016 Document Reviewed: 11/07/2014 Elsevier Interactive Patient Education  2017 [employer].

## 2020-04-03 NOTE — Progress Notes (Signed)
Subjective:   Danny Odonohue. is a 74 y.o. male who presents for Medicare Annual/Subsequent preventive examination.  Review of Systems:  No ROS.  Medicare Wellness Virtual Visit.   Cardiac Risk Factors include: advanced age (>5men, >48 women);male gender;hypertension     Objective:    Vitals: BP 122/83 (BP Location: Left Arm, Patient Position: Sitting, Cuff Size: Normal)   Ht 5\' 10"  (1.778 m)   Wt 196 lb (88.9 kg)   BMI 28.12 kg/m   Body mass index is 28.12 kg/m.  Advanced Directives 04/03/2020 03/21/2019 11/21/2017 11/28/2016 11/21/2016 11/18/2016 09/07/2016  Does Patient Have a Medical Advance Directive? No No No No No No Yes  Type of Advance Directive - - - - - - 09/09/2016 in Chart? - - - - - - No - copy requested  Would patient like information on creating a medical advance directive? No - Patient declined Yes (MAU/Ambulatory/Procedural Areas - Information given) No - Patient declined - - No - Patient declined -  Some encounter information is confidential and restricted. Go to Review Flowsheets activity to see all data.    Tobacco Social History   Tobacco Use  Smoking Status Never Smoker  Smokeless Tobacco Never Used     Counseling given: Not Answered   Clinical Intake:  Pre-visit preparation completed: Yes        Diabetes: No  How often do you need to have someone help you when you read instructions, pamphlets, or other written materials from your doctor or pharmacy?: 1 - Never  Interpreter Needed?: No     Past Medical History:  Diagnosis Date  . Bipolar disorder (HCC)   . Chicken pox   . Glaucoma   . Heart murmur   . History of blood transfusion   . Hypertension   . Mumps   . Pelvis fracture [facility])    Past Surgical History:  Procedure Laterality Date  . ADENOIDECTOMY    . CATARACT EXTRACTION W/PHACO Right 01/13/2016   Procedure: CATARACT EXTRACTION PHACO AND INTRAOCULAR LENS PLACEMENT  (IOC);  Surgeon: 01/15/2016, MD;  Location: [facility];  Service: Ophthalmology;  Laterality: Right;  . CATARACT EXTRACTION W/PHACO Left 09/07/2016   Procedure: CATARACT EXTRACTION PHACO AND INTRAOCULAR LENS PLACEMENT (IOC);  Surgeon: 09/09/2016, MD;  Location: [facility];  Service: Ophthalmology;  Laterality: Left;  PT WOULD LIKE LATER APPT  . CHOLECYSTECTOMY    . EYE SURGERY    . PENILE PROSTHESIS IMPLANT    . 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 Arthritis Mother   . Hypertension Mother   . Anxiety disorder Mother   . Depression Mother   . Lung cancer Mother   . Arthritis Father   . Hypertension Father   . Diabetes Father   . Lung cancer Father   . Prostate cancer Father   . Diabetes Sister   . Leukemia Sister    Social History   Socioeconomic History  . Marital status: Single    Spouse name: Not on file  . Number of children: Not on file  . Years of education: Not on file  . Highest education level: Not on file  Occupational History  . Not on file  Tobacco Use  . Smoking status: Never Smoker  . Smokeless tobacco: Never Used  Vaping Use  .  Vaping Use: Never used  Substance and Sexual Activity  . Alcohol use: No    Alcohol/week: 0.0 standard drinks  . Drug use: No  . Sexual activity: Not Currently  Other Topics Concern  . Not on file  Social History Narrative  . Not on file   Social Determinants of Health   Financial Resource Strain:   . Difficulty of Paying Living Expenses:   Food Insecurity:   . Worried About Programme researcher, broadcasting/film/video in the Last Year:   . Barista in the Last Year:   Transportation Needs:   . Freight forwarder (Medical):   Marland Kitchen Lack of Transportation (Non-Medical):   Physical Activity:   . Days of Exercise per Week:   . Minutes of Exercise per Session:   Stress:   . Feeling of Stress :   Social  Connections:   . Frequency of Communication with Friends and Family:   . Frequency of Social Gatherings with Friends and Family:   . Attends Religious Services:   . Active Member of Clubs or Organizations:   . Attends Banker Meetings:   Marland Kitchen Marital Status:     Outpatient Encounter Medications as of 04/03/2020  Medication Sig  . amLODipine (NORVASC) 10 MG tablet Take 1 tablet (10 mg total) by mouth daily.  . ASPIRIN 81 PO Take 81 mg by mouth daily.  Marland Kitchen atorvastatin (LIPITOR) 40 MG tablet Take 1 tablet (40 mg total) by mouth daily.  . cholecalciferol (VITAMIN D) 1000 units tablet Take 1,000 Units by mouth daily.  Marland Kitchen escitalopram (LEXAPRO) 10 MG tablet TAKE 1 AND 1/2 TABLETS     (15MG  TOTAL) DAILY  . ferrous sulfate 325 (65 FE) MG tablet TAKE 1 TABLET BY MOUTH TWICE A DAY  . lamoTRIgine (LAMICTAL) 25 MG tablet Take 1 tablet (25 mg total) by mouth 2 (two) times daily.  . NEOMYCIN-POLYMYXIN-HYDROCORTISONE (CORTISPORIN) 1 % SOLN OTIC solution Apply 1-2 drops to toe BID after soaking  . QUEtiapine (SEROQUEL) 50 MG tablet Take 1 tablet (50 mg total) by mouth at bedtime.  . rivastigmine (EXELON) 6 MG capsule Take 1 capsule (6 mg total) by mouth 2 (two) times daily.   No facility-administered encounter medications on file as of 04/03/2020.    Activities of Daily Living In your present state of health, do you have any difficulty performing the following activities: 04/03/2020  Hearing? N  Vision? N  Difficulty concentrating or making decisions? N  Walking or climbing stairs? Y  Dressing or bathing? N  Doing errands, shopping? N  Preparing Food and eating ? N  Using the Toilet? N  In the past six months, have you accidently leaked urine? N  Do you have problems with loss of bowel control? N  Managing your Medications? N  Managing your Finances? N  Housekeeping or managing your Housekeeping? N  Some recent data might be hidden    Patient Care Team: 04/05/2020, MD as PCP  - General (Family Medicine)   Assessment:   This is a routine wellness examination for Danny Mcgrath.  I connected with Danny Mcgrath today by telephone and verified that I am speaking with the correct person using two identifiers. Location patient: home Location provider: work Persons participating in the virtual visit: patient, Danny Mcgrath.    I discussed the limitations, risks, security and privacy concerns of performing an evaluation and management service by telephone and the availability of in person appointments. The patient expressed understanding and verbally consented to  this telephonic visit.    Interactive audio and video telecommunications were attempted between this provider and patient, however failed, due to patient having technical difficulties OR patient did not have access to video capability.  We continued and completed visit with audio only.  Some vital signs may be absent or patient reported.   Eye: Visual acuity not assessed. Virtual visit. Followed by their ophthalmologist.  Dental: Plans to call his insurance and inquire of costs to extract.    Hearing: Demonstrates normal hearing during visit.  Safety:  Patient feels safe at home- yes Patient does have smoke detectors at home- yes Patient does wear sunscreen or protective clothing when in direct sunlight - yes Patient does wear seat belt when in a moving vehicle - yes Patient drives- yes Adequate lighting in walkways free from debris- yes Grab bars and handrails used as appropriate- yes Ambulates with an assistive device- yes; cane  Medication: Taking as directed and without issues.  Pill box in use -yes  Self managed - yes   Covid-19: Precautions and sickness symptoms discussed. Wears mask, social distancing, hand hygiene as appropriate.   Activities of Daily Living Patient denies needing assistance with: household chores, feeding themselves, getting from bed to chair, getting to the toilet, bathing/showering,  dressing, managing money, or preparing meals.   Discussed the importance of a healthy diet, water intake and the benefits of aerobic exercise.   Physical activity- active around the home. Strength training weights daily.   Diet:  Regular Water: good intake  Other Providers Patient Care Team: Leone Haven, MD as PCP - General (Family Medicine)  Exercise Activities and Dietary recommendations Current Exercise Habits: Home exercise routine, Type of exercise: strength training/weights, Frequency (Times/Week): 5, Intensity: Mild  Goals    . Follow up with Primary Care Provider     As needed       Fall Risk Fall Risk  04/03/2020 10/21/2019 10/21/2019 03/21/2019 11/21/2017  Falls in the past year? 1 1 1 1  Yes  Number falls in past yr: 0 1 1 1 2  or more  Comment - - - Knee weakness. Followed by pcp.  -  Injury with Fall? - 0 0 0 No  Risk Factor Category  - - - - High Fall Risk  Risk for fall due to : - History of fall(s) - History of fall(s) History of fall(s)  Follow up Falls evaluation completed Falls evaluation completed;Education provided;Falls prevention discussed Falls evaluation completed - Falls prevention discussed;Education provided   Is the patient's home free of loose throw rugs in walkways, pet beds, electrical cords, etc?  Yes      Grab bars in the bathroom? No      Handrails on the stairs?   Yes        Adequate lighting?   Yes  Timed Get Up and Go Performed: No, virtual visit  Depression Screen PHQ 2/9 Scores 04/03/2020 10/21/2019 03/21/2019 11/21/2017  PHQ - 2 Score 0 0 0 0  PHQ- 9 Score 0 - - -  Exception Documentation Other- indicate reason in comment box - - -  Not completed Followed by counseling every 2 months. Notes doing well. - - -    Cognitive Function  Patient is alert and oriented x3. Patient denies difficulty focusing or concentrating.  MMSE - Mini Mental State Exam 11/18/2016  Orientation to time 5  Orientation to Place 5  Registration 3  Attention/  Calculation 5  Recall 3  Language- name 2 objects 2  Language- repeat 1  Language- follow 3 step command 3  Language- read & follow direction 1  Write a sentence 1  Copy design 1  Total score 30     6CIT Screen 04/03/2020 03/21/2019 11/21/2017  What Year? 0 points 0 points 0 points  What month? 0 points 0 points 0 points  What time? - 0 points 0 points  Count back from 20 - 0 points 0 points  Months in reverse 0 points 0 points 0 points  Repeat phrase 4 points - 0 points  Total Score - - 0    Immunization History  Administered Date(s) Administered  . Fluad Quad(high Dose 65+) 07/17/2019  . Influenza, High Dose Seasonal PF 11/18/2016, 08/04/2017  . Influenza,inj,Quad PF,6+ Mos 07/23/2015  . PFIZER SARS-COV-2 Vaccination 12/18/2019, 02/08/2020  . Pneumococcal Conjugate-13 11/19/2015  . Pneumococcal Polysaccharide-23 11/21/2017  . Tdap 11/19/2015   Screening Tests Health Maintenance  Topic Date Due  . Fecal DNA (Cologuard)  12/28/2019  . INFLUENZA VACCINE  05/17/2020  . TETANUS/TDAP  11/18/2025  . COVID-19 Vaccine  Completed  . PNA vac Low Risk Adult  Completed  . Hepatitis C Screening  Addressed   Cancer Screenings: Lung: Low Dose CT Chest recommended if Age 49-80 years, 30 pack-year currently smoking OR have quit w/in 15years. Patient does not qualify. Colorectal: cologuard deferred per patient request. Colonoscopy; considering. Plans to follow up with pcp.       Plan:   Keep all routine maintenance appointments.   Follow up 04/21/20 @ 13:30  Medicare Attestation I have personally reviewed: The patient's medical and social history Their use of alcohol, tobacco or illicit drugs Their current medications and supplements The patient's functional ability including ADLs,fall risks, home safety risks, cognitive, and hearing and visual impairment Diet and physical activities Evidence for depression   I have reviewed and discussed with patient certain preventive protocols,  quality metrics, and best practice recommendations. A written personalized care plan for preventive services as well as general preventive health recommendations were provided via mail to patient.     Ashok Pall, LPN  1/44/3154

## 2020-04-03 NOTE — Progress Notes (Signed)
I have reviewed the above note and agree.  Jocelynn Gioffre, M.D.  

## 2020-04-21 ENCOUNTER — Ambulatory Visit: Payer: Medicare Other | Admitting: Family Medicine

## 2020-04-21 ENCOUNTER — Other Ambulatory Visit: Payer: Self-pay

## 2020-04-21 ENCOUNTER — Encounter: Payer: Self-pay | Admitting: Family Medicine

## 2020-04-21 VITALS — BP 120/80 | HR 63 | Temp 98.7°F | Ht 70.0 in | Wt 198.8 lb

## 2020-04-21 DIAGNOSIS — I10 Essential (primary) hypertension: Secondary | ICD-10-CM

## 2020-04-21 DIAGNOSIS — E785 Hyperlipidemia, unspecified: Secondary | ICD-10-CM | POA: Diagnosis not present

## 2020-04-21 DIAGNOSIS — E611 Iron deficiency: Secondary | ICD-10-CM | POA: Diagnosis not present

## 2020-04-21 LAB — COMPREHENSIVE METABOLIC PANEL
ALT: 11 U/L (ref 0–53)
AST: 16 U/L (ref 0–37)
Albumin: 4.4 g/dL (ref 3.5–5.2)
Alkaline Phosphatase: 112 U/L (ref 39–117)
BUN: 17 mg/dL (ref 6–23)
CO2: 31 mEq/L (ref 19–32)
Calcium: 9.4 mg/dL (ref 8.4–10.5)
Chloride: 105 mEq/L (ref 96–112)
Creatinine, Ser: 1.29 mg/dL (ref 0.40–1.50)
GFR: 54.17 mL/min — ABNORMAL LOW (ref >60.00)
Glucose, Bld: 106 mg/dL — ABNORMAL HIGH (ref 70–99)
Potassium: 4 mEq/L (ref 3.5–5.1)
Sodium: 143 mEq/L (ref 135–145)
Total Bilirubin: 1 mg/dL (ref 0.2–1.2)
Total Protein: 7.4 g/dL (ref 6.0–8.3)

## 2020-04-21 LAB — LIPID PANEL
Cholesterol: 190 mg/dL (ref 0–200)
HDL: 47.6 mg/dL (ref >39.00)
LDL Cholesterol: 129 mg/dL — ABNORMAL HIGH (ref 0–99)
NonHDL: 141.97
Total CHOL/HDL Ratio: 4
Triglycerides: 67 mg/dL (ref 0.0–149.0)
VLDL: 13.4 mg/dL (ref 0.0–40.0)

## 2020-04-21 LAB — IBC + FERRITIN
Ferritin: 26 ng/mL (ref 22.0–322.0)
Iron: 100 ug/dL (ref 42–165)
Saturation Ratios: 26.1 % (ref 20.0–50.0)
Transferrin: 274 mg/dL (ref 212.0–360.0)

## 2020-04-21 LAB — CBC
HCT: 43.4 % (ref 39.0–52.0)
Hemoglobin: 15 g/dL (ref 13.0–17.0)
MCHC: 34.6 g/dL (ref 30.0–36.0)
MCV: 94.5 fl (ref 78.0–100.0)
Platelets: 165 10*3/uL (ref 150.0–400.0)
RBC: 4.59 Mil/uL (ref 4.22–5.81)
RDW: 13.7 % (ref 11.5–15.5)
WBC: 6.9 10*3/uL (ref 4.0–10.5)

## 2020-04-21 NOTE — Assessment & Plan Note (Signed)
He will call GI to reschedule his appointment. Discussed the importance of this.

## 2020-04-21 NOTE — Assessment & Plan Note (Signed)
Check lipid panel. Continue lipitor. 

## 2020-04-21 NOTE — Patient Instructions (Signed)
Nice to see you. We will get labs today.  Please call GI to schedule an appointment for evaluation of your iron deficiency. It is important that you complete this evaluation to make sure there is not an cancer or other lesions contributing to your low iron levels.

## 2020-04-21 NOTE — Assessment & Plan Note (Signed)
Well controlled. Continue amlodipine. Monitor for recurrence of swelling.

## 2020-04-21 NOTE — Progress Notes (Signed)
  Danny Rumps, MD Phone: [phone]  Danny Nixon. is a 74 y.o. male who presents today for f/u.  HYPERTENSION  Disease Monitoring  Home BP Monitoring 120/80 Chest pain- no    Dyspnea- no Medications  Compliance-  Taking amlodipine.   Edema- yes for one day with long socks, no orthopnea or PND, has now resolved.   Iron deficiency: continues on his iron supplement. Has not seen GI yet, though missed a previous appointment and needs to call to reschedule. He has the contact information for this.     Social History   Tobacco Use  Smoking Status Never Smoker  Smokeless Tobacco Never Used     ROS see history of present illness  Objective  Physical Exam Vitals:   04/21/20 1232  BP: 120/80  Pulse: 63  Temp: 98.7 F (37.1 C)  SpO2: 94%    BP Readings from Last 3 Encounters:  04/21/20 120/80  04/03/20 122/83  07/10/18 123/79   Wt Readings from Last 3 Encounters:  04/21/20 198 lb 12.8 oz (90.2 kg)  04/03/20 196 lb (88.9 kg)  10/21/19 196 lb (88.9 kg)    Physical Exam Constitutional:      General: He is not in acute distress.    Appearance: He is not diaphoretic.  Cardiovascular:     Rate and Rhythm: Normal rate and regular rhythm.     Heart sounds: Normal heart sounds.  Pulmonary:     Effort: Pulmonary effort is normal.     Breath sounds: Normal breath sounds.  Musculoskeletal:     Right lower leg: No edema.     Left lower leg: No edema.  Skin:    General: Skin is warm and dry.  Neurological:     Mental Status: He is alert.      Assessment/Plan: Please see individual problem list.  HTN (hypertension) Well controlled. Continue amlodipine. Monitor for recurrence of swelling.   Iron deficiency He will call GI to reschedule his appointment. Discussed the importance of this.   Hyperlipidemia Check lipid panel. Continue lipitor.    Orders Placed This Encounter  Procedures  . Comp Met (CMET)  . Lipid panel  . CBC  . IBC + Ferritin     No orders of the defined types were placed in this encounter.   This visit occurred during the SARS-CoV-2 public health emergency.  Safety protocols were in place, including screening questions prior to the visit, additional usage of staff PPE, and extensive cleaning of exam room while observing appropriate contact time as indicated for disinfecting solutions.    Danny Rumps, MD [place]

## 2020-05-10 ENCOUNTER — Other Ambulatory Visit: Payer: Self-pay | Admitting: Psychiatry

## 2020-05-10 DIAGNOSIS — F3176 Bipolar disorder, in full remission, most recent episode depressed: Secondary | ICD-10-CM

## 2020-05-11 ENCOUNTER — Telehealth (INDEPENDENT_AMBULATORY_CARE_PROVIDER_SITE_OTHER): Payer: Medicare Other | Admitting: Psychiatry

## 2020-05-11 ENCOUNTER — Other Ambulatory Visit: Payer: Self-pay

## 2020-05-11 ENCOUNTER — Encounter: Payer: Self-pay | Admitting: Psychiatry

## 2020-05-11 DIAGNOSIS — F3176 Bipolar disorder, in full remission, most recent episode depressed: Secondary | ICD-10-CM

## 2020-05-11 DIAGNOSIS — F3181 Bipolar II disorder: Secondary | ICD-10-CM

## 2020-05-11 DIAGNOSIS — F09 Unspecified mental disorder due to known physiological condition: Secondary | ICD-10-CM

## 2020-05-11 MED ORDER — ESCITALOPRAM OXALATE 10 MG PO TABS: ORAL_TABLET | ORAL | 1 refills | Status: DC

## 2020-05-11 MED ORDER — RIVASTIGMINE TARTRATE 6 MG PO CAPS: 6.0000 mg | ORAL_CAPSULE | Freq: Two times a day (BID) | ORAL | 1 refills | Status: DC

## 2020-05-11 MED ORDER — LAMOTRIGINE 25 MG PO TABS: 25.0000 mg | ORAL_TABLET | Freq: Two times a day (BID) | ORAL | 1 refills | Status: DC

## 2020-05-11 NOTE — Progress Notes (Signed)
Provider Location : [facility] Patient Location : Home  Virtual Visit via Telephone Note  I connected with Danny Mcgrath. on 05/11/20 at  4:00 PM EDT by telephone and verified that I am speaking with the correct person using two identifiers.   I discussed the limitations, risks, security and privacy concerns of performing an evaluation and management service by telephone and the availability of in person appointments. I also discussed with the patient that there may be a patient responsible charge related to this service. The patient expressed understanding and agreed to proceed.    I discussed the assessment and treatment plan with the patient. The patient was provided an opportunity to ask questions and all were answered. The patient agreed with the plan and demonstrated an understanding of the instructions.   The patient was advised to call back or seek an in-person evaluation if the symptoms worsen or if the condition fails to improve as anticipated.   BH MD OP Progress Note  05/11/2020 4:17 PM Danny Mcgrath.  MRN:  [id number]  Chief Complaint:  Chief Complaint    Follow-up     HPI: Danny Mcgrath. is a 74 year old Caucasian male, divorced, lives in [place], has a history of cognitive disorder, bipolar disorder in remission was evaluated by phone today.  Patient preferred to do a phone call.  Patient today reports he is currently doing well with regards to his mood symptoms.  He denies any significant depression or anxiety symptoms.  He reports he has been more positive with his thoughts and that does help when he does have sadness.  He is often able to distract himself and focus on something good and that definitely helps.  He continues to follow-up with his therapist Ms. Felecia Jan.  He reports therapy sessions is beneficial.  He reports he continues to take medications for his mood and denies side effects.  He does have mild memory problems on and off when he  walks into a room and forgets what he wanted to get from that room.  He however reports that does not happen too often.  He continues to take the rivastigmine.  Patient denies any suicidality, homicidality or perceptual disturbances.  Patient reports sleep as good.  Patient denies any appetite problems.  Patient denies any other concerns today.  Visit Diagnosis:    ICD-10-CM   1. Bipolar disorder, in full remission, most recent episode depressed (HCC)  F31.76 escitalopram (LEXAPRO) 10 MG tablet  2. Mild cognitive disorder  F09 rivastigmine (EXELON) 6 MG capsule    Past Psychiatric History: I have reviewed past psychiatric history from my progress note on 12/25/2017  Past Medical History:  Past Medical History:  Diagnosis Date   Bipolar disorder (HCC)    Chicken pox    Glaucoma    Heart murmur    History of blood transfusion    Hypertension    Mumps    Pelvis fracture [facility])     Past Surgical History:  Procedure Laterality Date   ADENOIDECTOMY     CATARACT EXTRACTION W/PHACO Right 01/13/2016   Procedure: CATARACT EXTRACTION PHACO AND INTRAOCULAR LENS PLACEMENT (IOC);  Surgeon: Lockie Mola, MD;  Location: [facility];  Service: Ophthalmology;  Laterality: Right;   CATARACT EXTRACTION W/PHACO Left 09/07/2016   Procedure: CATARACT EXTRACTION PHACO AND INTRAOCULAR LENS PLACEMENT (IOC);  Surgeon: Lockie Mola, MD;  Location: [facility];  Service: Ophthalmology;  Laterality: Left;  PT WOULD LIKE LATER APPT   CHOLECYSTECTOMY  EYE SURGERY     PENILE PROSTHESIS IMPLANT     SURGERY SCROTAL / TESTICULAR     Unclear history; 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    Family Psychiatric History: I have reviewed family psychiatric history from my progress note on 12/25/2017  Family History:  Family History  Problem Relation Age of Onset   Arthritis Mother    Hypertension Mother    Anxiety disorder Mother    Depression Mother    Lung  cancer Mother    Arthritis Father    Hypertension Father    Diabetes Father    Lung cancer Father    Prostate cancer Father    Diabetes Sister    Leukemia Sister     Social History: I have reviewed social history from my progress note on 12/25/2017 Social History   Socioeconomic History   Marital status: Single    Spouse name: Not on file   Number of children: Not on file   Years of education: Not on file   Highest education level: Not on file  Occupational History   Not on file  Tobacco Use   Smoking status: Never Smoker   Smokeless tobacco: Never Used  Vaping Use   Vaping Use: Never used  Substance and Sexual Activity   Alcohol use: No    Alcohol/week: 0.0 standard drinks   Drug use: No   Sexual activity: Not Currently  Other Topics Concern   Not on file  Social History Narrative   Not on file   Social Determinants of Health   Financial Resource Strain:    Difficulty of Paying Living Expenses:   Food Insecurity:    Worried About Programme researcher, broadcasting/film/video in the Last Year:    Barista in the Last Year:   Transportation Needs:    Freight forwarder (Medical):    Lack of Transportation (Non-Medical):   Physical Activity:    Days of Exercise per Week:    Minutes of Exercise per Session:   Stress:    Feeling of Stress :   Social Connections:    Frequency of Communication with Friends and Family:    Frequency of Social Gatherings with Friends and Family:    Attends Religious Services:    Active Member of Clubs or Organizations:    Attends Banker Meetings:    Marital Status:     Allergies: No Known Allergies  Metabolic Disorder Labs: Lab Results  Component Value Date   HGBA1C 5.6 05/23/2017   No results found for: PROLACTIN Lab Results  Component Value Date   CHOL 190 04/21/2020   TRIG 67.0 04/21/2020   HDL 47.60 04/21/2020   CHOLHDL 4 04/21/2020   VLDL 13.4 04/21/2020   LDLCALC 129 (H)  04/21/2020   LDLCALC 99 04/11/2019   No results found for: TSH  Therapeutic Level Labs: No results found for: LITHIUM No results found for: VALPROATE No components found for:  CBMZ  Current Medications: Current Outpatient Medications  Medication Sig Dispense Refill   amLODipine (NORVASC) 10 MG tablet Take 1 tablet (10 mg total) by mouth daily. 90 tablet 3   ASPIRIN 81 PO Take 81 mg by mouth daily.     atorvastatin (LIPITOR) 40 MG tablet Take 1 tablet (40 mg total) by mouth daily. 90 tablet 3   cholecalciferol (VITAMIN D) 1000 units tablet Take 1,000 Units by mouth daily.     escitalopram (LEXAPRO) 10 MG tablet TAKE 1 AND 1/2 TABLETS     (  15MG  TOTAL) DAILY 135 tablet 1   ferrous sulfate 325 (65 FE) MG tablet TAKE 1 TABLET BY MOUTH TWICE A DAY 60 tablet 3   lamoTRIgine (LAMICTAL) 25 MG tablet Take 1 tablet (25 mg total) by mouth 2 (two) times daily. 180 tablet 1   NEOMYCIN-POLYMYXIN-HYDROCORTISONE (CORTISPORIN) 1 % SOLN OTIC solution Apply 1-2 drops to toe BID after soaking 10 mL 1   QUEtiapine (SEROQUEL) 50 MG tablet TAKE 1 TABLET BY MOUTH AT  BEDTIME 90 tablet 3   rivastigmine (EXELON) 6 MG capsule Take 1 capsule (6 mg total) by mouth 2 (two) times daily. 180 capsule 1   No current facility-administered medications for this visit.     Musculoskeletal: Strength & Muscle Tone: UTA Gait & Station: UTA Patient leans: N/A  Psychiatric Specialty Exam: Review of Systems  Psychiatric/Behavioral: Negative for agitation, behavioral problems, confusion, decreased concentration, dysphoric mood, hallucinations, self-injury, sleep disturbance and suicidal ideas. The patient is not nervous/anxious and is not hyperactive.   All other systems reviewed and are negative.   There were no vitals taken for this visit.There is no height or weight on file to calculate BMI.  General Appearance: UTA  Eye Contact:  UTA  Speech:  Normal Rate  Volume:  Normal  Mood:  Euthymic  Affect:  UTA   Thought Process:  Goal Directed and Descriptions of Associations: Intact  Orientation:  Full (Time, Place, and Person)  Thought Content: Logical   Suicidal Thoughts:  No  Homicidal Thoughts:  No  Memory:  Immediate;   Fair Recent;   Fair Remote;   Fair  Judgement:  Fair  Insight:  Fair  Psychomotor Activity:  UTA  Concentration:  Concentration: Fair and Attention Span: Fair  Recall:  Fis[employer]Fair  Fund of Knowledge: Fair  Language: Fair  Akathisia:  No  Handed:  Right  AIMS (if indicated): UTA  Assets:  Communication Skills Desire for Improvement Housing Social Support  ADL's:  Intact  Cognition: WNL  Sleep:  Fair   Screenings: Mini-Mental     [facility]18/[facility] in [facility]  Total Score (max 30 points ) 30    PHQ2-9     Clinical Support [facility]2020 [facility] [place] Office Visit from [facility]/21/2019 in [facility] [place] Clinical Support from [facility]/21/2019 in [facility] [place] Clinical [facility] [place] Office Visit [facility]/18/2016 in [facility] [place]  PHQ-2 Total Score 0 0 0 0 0  PHQ-9 Total Score 0 -- -- -- --       Assessment and Plan: Danny MathJoseph Accardi Jr. is a 74 year old Caucasian male, lives in [place], has a history of cognitive disorder, bipolar disorder was evaluated by phone today.  Patient is currently stable on current medication regimen.  Patient will continue to benefit from psychotherapy sessions.  Plan as noted below.  Plan Bipolar disorder in remission Seroquel 50 mg p.o. nightly Lexapro 15 mg p.o. daily Lamotrigine 25 mg p.o. twice daily Continue psychotherapy sessions with Ms. Felecia Janina Thompson  Cognitive disorder-mild-multifactorial-stable Exelon as prescribed. Continue follow-up with neurology as needed  Follow-up in clinic in 3 months or sooner if needed.  I have spent atleast 19 minutes non face to face with patient today. More  than 50 % of the time was spent for preparing to see the patient ( e.g., review of test, records ), obtaining and to review and separately obtained history , ordering medications and test ,psychoeducation and supportive  psychotherapy and care coordination,as well as documenting clinical information in electronic health record. This note was generated in part or whole with voice recognition software. Voice recognition is usually quite accurate but there are transcription errors that can and very often do occur. I apologize for any typographical errors that were not detected and corrected.        Danny Longs, MD 05/11/2020, 4:17 PM

## 2020-05-13 NOTE — Progress Notes (Signed)
After several attempts to reach the patient by phone a letter for lab results with comments from the provider was mailed today to the patient.  Bereket Gernert,cma

## 2020-06-18 ENCOUNTER — Telehealth: Payer: Self-pay | Admitting: Psychiatry

## 2020-06-18 DIAGNOSIS — F3176 Bipolar disorder, in full remission, most recent episode depressed: Secondary | ICD-10-CM

## 2020-06-18 NOTE — Telephone Encounter (Signed)
Patient is not due for refills.

## 2020-07-27 ENCOUNTER — Telehealth: Payer: Self-pay | Admitting: Family Medicine

## 2020-07-27 MED ORDER — AMLODIPINE BESYLATE 10 MG PO TABS: 10.0000 mg | ORAL_TABLET | Freq: Every day | ORAL | 3 refills | Status: DC

## 2020-07-27 NOTE — Telephone Encounter (Signed)
Pt needs a refill on amLODipine (NORVASC) 10 MG tablet sent to [facility] for 90 days

## 2020-08-06 ENCOUNTER — Other Ambulatory Visit: Payer: Self-pay

## 2020-08-06 ENCOUNTER — Telehealth (INDEPENDENT_AMBULATORY_CARE_PROVIDER_SITE_OTHER): Payer: Medicare Other | Admitting: Psychiatry

## 2020-08-06 DIAGNOSIS — F3176 Bipolar disorder, in full remission, most recent episode depressed: Secondary | ICD-10-CM

## 2020-08-06 DIAGNOSIS — Z5329 Procedure and treatment not carried out because of patient's decision for other reasons: Secondary | ICD-10-CM | POA: Insufficient documentation

## 2020-08-06 DIAGNOSIS — Z91199 Patient's noncompliance with other medical treatment and regimen due to unspecified reason: Secondary | ICD-10-CM | POA: Insufficient documentation

## 2020-08-06 NOTE — Progress Notes (Addendum)
No response to call .  however pt attempted to call front desk later on. Will have him reschedule since its late.

## 2020-08-06 NOTE — Addendum Note (Signed)
Addended byJomarie Longs on: 08/06/2020 04:42 PM   Modules accepted: Level of Service

## 2020-09-06 ENCOUNTER — Other Ambulatory Visit: Payer: Self-pay | Admitting: Psychiatry

## 2020-09-06 DIAGNOSIS — F3176 Bipolar disorder, in full remission, most recent episode depressed: Secondary | ICD-10-CM

## 2020-09-06 DIAGNOSIS — F09 Unspecified mental disorder due to known physiological condition: Secondary | ICD-10-CM

## 2020-09-06 DIAGNOSIS — F3181 Bipolar II disorder: Secondary | ICD-10-CM

## 2020-11-24 ENCOUNTER — Other Ambulatory Visit: Payer: Self-pay

## 2020-11-24 ENCOUNTER — Telehealth: Payer: Self-pay | Admitting: Family Medicine

## 2020-11-24 ENCOUNTER — Encounter: Payer: Self-pay | Admitting: Psychiatry

## 2020-11-24 ENCOUNTER — Telehealth (INDEPENDENT_AMBULATORY_CARE_PROVIDER_SITE_OTHER): Payer: Medicare Other | Admitting: Psychiatry

## 2020-11-24 DIAGNOSIS — F09 Unspecified mental disorder due to known physiological condition: Secondary | ICD-10-CM

## 2020-11-24 DIAGNOSIS — F3176 Bipolar disorder, in full remission, most recent episode depressed: Secondary | ICD-10-CM

## 2020-11-24 MED ORDER — QUETIAPINE FUMARATE 25 MG PO TABS: 25.0000 mg | ORAL_TABLET | Freq: Every day | ORAL | 0 refills | Status: DC

## 2020-11-24 MED ORDER — AMLODIPINE BESYLATE 10 MG PO TABS
10.0000 mg | ORAL_TABLET | Freq: Every day | ORAL | 3 refills | Status: DC
Start: 2020-11-24 — End: 2021-03-03

## 2020-11-24 NOTE — Telephone Encounter (Signed)
Patient is requesting a refill, 69m supply on his amLODipine (NORVASC) 10 MG tablet, please send to mail order pharmacy.

## 2020-11-24 NOTE — Progress Notes (Unsigned)
Virtual Visit via Telephone Note  I connected with Danny Math. on 11/24/20 at  4:30 PM EST by telephone and verified that I am speaking with the correct person using two identifiers.  Location Provider Location : [facility] Patient Location : Home  Participants: Patient , Provider    I discussed the limitations, risks, security and privacy concerns of performing an evaluation and management service by telephone and the availability of in person appointments. I also discussed with the patient that there may be a patient responsible charge related to this service. The patient expressed understanding and agreed to proceed.   I discussed the assessment and treatment plan with the patient. The patient was provided an opportunity to ask questions and all were answered. The patient agreed with the plan and demonstrated an understanding of the instructions.   The patient was advised to call back or seek an in-person evaluation if the symptoms worsen or if the condition fails to improve as anticipated.   BH MD OP Progress Note  11/24/2020 9:05 PM Danny Math.  MRN:  [id number]  Chief Complaint:  Chief Complaint    Follow-up     HPI: Danny Conroy. is a 75 year old Caucasian male, divorced, lives in [place], has a history of cognitive disorder, bipolar disorder in remission was evaluated by phone today.  Patient today reports he is currently doing well with regards to his mood.  Denies any mood swings.  Patient reports his psychotherapy sessions with Ms. Felecia Jan helped him tremendously.  He reports he no longer follows up with her since he is at a better place and he and his therapist decided to conclude the sessions.  Patient reports even when he feels depressed he is able to cope with his depression and he does not stay depressed for too long.  Patient reports sleep is good.  Patient denies any suicidality, homicidality or perceptual disturbances.  Patient  denies any significant problems with his memory.  He appears to be alert, oriented and was able to tell me the date the year and the month and also answer all questions appropriately in session.  Patient denies any other concerns today. Visit Diagnosis:    ICD-10-CM   1. Bipolar disorder, in full remission, most recent episode depressed (HCC)  F31.76 QUEtiapine (SEROQUEL) 25 MG tablet  2. Mild cognitive disorder  F09     Past Psychiatric History: I have reviewed past psychiatric history from my progress note on 12/25/2017  Past Medical History:  Past Medical History:  Diagnosis Date  . Bipolar disorder (HCC)   . Chicken pox   . Glaucoma   . Heart murmur   . History of blood transfusion   . Hypertension   . Mumps   . Pelvis fracture [facility])     Past Surgical History:  Procedure Laterality Date  . ADENOIDECTOMY    . CATARACT EXTRACTION W/PHACO Right 01/13/2016   Procedure: CATARACT EXTRACTION PHACO AND INTRAOCULAR LENS PLACEMENT (IOC);  Surgeon: Lockie Mola, MD;  Location: [facility];  Service: Ophthalmology;  Laterality: Right;  . CATARACT EXTRACTION W/PHACO Left 09/07/2016   Procedure: CATARACT EXTRACTION PHACO AND INTRAOCULAR LENS PLACEMENT (IOC);  Surgeon: Lockie Mola, MD;  Location: [facility];  Service: Ophthalmology;  Laterality: Left;  PT WOULD LIKE LATER APPT  . CHOLECYSTECTOMY    . EYE SURGERY    . PENILE PROSTHESIS IMPLANT    . SURGERY SCROTAL / TESTICULAR     Unclear history; Patient has no testicles in  scrotum    Family Psychiatric History: I have reviewed family psychiatric history from my progress note on 12/25/2017  Family History:  Family History  Problem Relation Age of Onset  . Arthritis Mother   . Hypertension Mother   . Anxiety disorder Mother   . Depression Mother   . Lung cancer Mother   . Arthritis Father   . Hypertension Father   . Diabetes Father   . Lung cancer Father   . Prostate cancer Father   . Diabetes  Sister   . Leukemia Sister     Social History: I have reviewed social history from my progress note on 12/25/2017 Social History   Socioeconomic History  . Marital status: Single    Spouse name: Not on file  . Number of children: Not on file  . Years of education: Not on file  . Highest education level: Not on file  Occupational History  . Not on file  Tobacco Use  . Smoking status: Never Smoker  . Smokeless tobacco: Never Used  Vaping Use  . Vaping Use: Never used  Substance and Sexual Activity  . Alcohol use: No    Alcohol/week: 0.0 standard drinks  . Drug use: No  . Sexual activity: Not Currently  Other Topics Concern  . Not on file  Social History Narrative  . Not on file   Social Determinants of Health   Financial Resource Strain: Not on file  Food Insecurity: Not on file  Transportation Needs: Not on file  Physical Activity: Not on file  Stress: Not on file  Social Connections: Not on file    Allergies: No Known Allergies  Metabolic Disorder Labs: Lab Results  Component Value Date   HGBA1C 5.6 05/23/2017   No results found for: PROLACTIN Lab Results  Component Value Date   CHOL 190 04/21/2020   TRIG 67.0 04/21/2020   HDL 47.60 04/21/2020   CHOLHDL 4 04/21/2020   VLDL 13.4 04/21/2020   LDLCALC 129 (H) 04/21/2020   LDLCALC 99 04/11/2019   No results found for: TSH  Therapeutic Level Labs: No results found for: LITHIUM No results found for: VALPROATE No components found for:  CBMZ  Current Medications: Current Outpatient Medications  Medication Sig Dispense Refill  . QUEtiapine (SEROQUEL) 25 MG tablet Take 1 tablet (25 mg total) by mouth at bedtime. 90 tablet 0  . amLODipine (NORVASC) 10 MG tablet Take 1 tablet (10 mg total) by mouth daily. 90 tablet 3  . ASPIRIN 81 PO Take 81 mg by mouth daily.    Marland Kitchen atorvastatin (LIPITOR) 40 MG tablet Take 1 tablet (40 mg total) by mouth daily. 90 tablet 3  . cholecalciferol (VITAMIN D) 1000 units tablet Take  1,000 Units by mouth daily.    Marland Kitchen escitalopram (LEXAPRO) 10 MG tablet TAKE 1 AND 1/2 TABLETS BY  MOUTH DAILY 135 tablet 3  . ferrous sulfate 325 (65 FE) MG tablet TAKE 1 TABLET BY MOUTH TWICE A DAY 60 tablet 3  . lamoTRIgine (LAMICTAL) 25 MG tablet TAKE 1 TABLET BY MOUTH  TWICE DAILY 180 tablet 3  . NEOMYCIN-POLYMYXIN-HYDROCORTISONE (CORTISPORIN) 1 % SOLN OTIC solution Apply 1-2 drops to toe BID after soaking 10 mL 1  . rivastigmine (EXELON) 6 MG capsule TAKE 1 CAPSULE BY MOUTH  TWICE DAILY 180 capsule 3   No current facility-administered medications for this visit.     Musculoskeletal: Strength & Muscle Tone: UTA Gait & Station: UTA Patient leans: N/A  Psychiatric Specialty Exam: Review of  Systems  Psychiatric/Behavioral: Negative for agitation, behavioral problems, confusion, decreased concentration, dysphoric mood, hallucinations, self-injury, sleep disturbance and suicidal ideas. The patient is not nervous/anxious and is not hyperactive.   All other systems reviewed and are negative.   There were no vitals taken for this visit.There is no height or weight on file to calculate BMI.  General Appearance: UTA  Eye Contact:  UTA  Speech:  Clear and Coherent  Volume:  Normal  Mood:  Euthymic  Affect:  UTA  Thought Process:  Goal Directed and Descriptions of Associations: Intact  Orientation:  Full (Time, Place, and Person)  Thought Content: Logical   Suicidal Thoughts:  No  Homicidal Thoughts:  No  Memory:  Immediate;   Fair Recent;   Fair Remote;   Limited  Judgement:  Fair  Insight:  Fair  Psychomotor Activity:  UTA  Concentration:  Concentration: Fair and Attention Span: Fair  Recall:  [employer] of Knowledge: Fair  Language: Fair  Akathisia:  No  Handed:  Right  AIMS (if indicated): UTA  Assets:  Communication Skills Desire for Improvement Housing  ADL's:  Intact  Cognition: WNL  Sleep:  Fair   Screenings: Mini-Mental   Flowsheet Row Office Visit from 11/18/2016  in [facility]  Total Score (max 30 points ) 30    PHQ2-9   Flowsheet Row Clinical Support from 04/03/2020 in [facility] Office Visit from 10/21/2019 in [facility] Clinical Support from 03/21/2019 in [facility] Clinical Support from 11/21/2017 in [facility] Office Visit from 11/18/2016 in [facility]  PHQ-2 Total Score 0 0 0 0 0  PHQ-9 Total Score 0 - - - -       Assessment and Plan: Langdon Crosson. is a 75 year old Caucasian male, lives in [place], has a history of cognitive disorder, bipolar disorder was evaluated by phone today.  Patient is currently doing well on the current medication regimen.  Discussed plan as noted below.  Plan Bipolar disorder in remission Reduce Seroquel to 25 mg p.o. nightly.  Patient will monitor himself closely for recurrence of mood symptoms or sleep problems. Lexapro 15 mg p.o. daily Lamotrigine 25 mg p.o. twice daily.  Cognitive disorder, mild multifactorial-stable Exelon 6 mg p.o. twice daily. Continue follow-up with neurology as needed  Follow-up in clinic in 3 weeks or sooner if needed.  I have spent atleast 18 minutes non  face to face  with patient today. More than 50 % of the time was spent for preparing to see the patient ( e.g., review of test, records ), obtaining and to review and separately obtained history , ordering medications and test ,psychoeducation and supportive psychotherapy and care coordination,as well as documenting clinical information in electronic health record. This note was generated in part or whole with voice recognition software. Voice recognition is usually quite accurate but there are transcription errors that can and very often do occur. I apologize for any typographical errors that were not detected and corrected.        Danny Longs, MD 11/25/2020, 10:37 AM

## 2020-11-30 ENCOUNTER — Telehealth: Payer: Self-pay | Admitting: Family Medicine

## 2020-11-30 NOTE — Telephone Encounter (Signed)
Patient has a physical scheduled on 01-15-21 at 1:45 and would like to have labs on that day as well

## 2020-12-03 ENCOUNTER — Ambulatory Visit: Payer: Medicare Other

## 2020-12-03 NOTE — Telephone Encounter (Signed)
LVM for the patient to call back and let us know if he wants to do labs on 4/1 with his visit or the morning of his visit.  Tiasia Weberg,cma

## 2020-12-10 ENCOUNTER — Encounter: Payer: Self-pay | Admitting: Psychiatry

## 2020-12-10 ENCOUNTER — Ambulatory Visit: Payer: Medicare Other

## 2020-12-10 ENCOUNTER — Other Ambulatory Visit: Payer: Self-pay

## 2020-12-10 ENCOUNTER — Telehealth (INDEPENDENT_AMBULATORY_CARE_PROVIDER_SITE_OTHER): Payer: Medicare Other | Admitting: Psychiatry

## 2020-12-10 DIAGNOSIS — F3176 Bipolar disorder, in full remission, most recent episode depressed: Secondary | ICD-10-CM

## 2020-12-10 DIAGNOSIS — Z79899 Other long term (current) drug therapy: Secondary | ICD-10-CM | POA: Insufficient documentation

## 2020-12-10 DIAGNOSIS — F09 Unspecified mental disorder due to known physiological condition: Secondary | ICD-10-CM

## 2020-12-10 NOTE — Progress Notes (Signed)
Virtual Visit via Telephone Note  I connected with Danny Math. on 12/10/20 at  4:20 PM EST by telephone and verified that I am speaking with the correct person using two identifiers.  Location Provider Location : [facility] Patient Location : Home  Participants: Patient , Provider   I discussed the limitations, risks, security and privacy concerns of performing an evaluation and management service by telephone and the availability of in person appointments. I also discussed with the patient that there may be a patient responsible charge related to this service. The patient expressed understanding and agreed to proceed.  I discussed the assessment and treatment plan with the patient. The patient was provided an opportunity to ask questions and all were answered. The patient agreed with the plan and demonstrated an understanding of the instructions.   The patient was advised to call back or seek an in-person evaluation if the symptoms worsen or if the condition fails to improve as anticipated.   BH MD OP Progress Note  12/10/2020 9:33 PM Danny Math.  MRN:  [id number]  Chief Complaint:  Chief Complaint    Follow-up     HPI: Danny Mcgrath. is a 75 year old Caucasian male, divorced, lives in [place], has a history of cognitive disorder, bipolar disorder in remission was evaluated by telemedicine today.  Patient today reports he is currently doing well with regards to his mood.  Patient denies any suicidality, homicidality or perceptual disturbances.  Patient reports he is compliant on medications.  He reports no side effects.  He reports sleep and appetite is fair.  He reports he is spending time watching TV, doing puzzles, reading.  Patient reports he plans to get some gardening done once the weather gets warmer.  Patient reports his memory problems as stable and even though he does have some problems remembering certain things, once he takes a pause and  tries to think, it always comes back to him.  Patient denies any other concerns today.  Visit Diagnosis:    ICD-10-CM   1. Bipolar disorder, in full remission, most recent episode depressed (HCC)  F31.76   2. Mild cognitive disorder  F09   3. High risk medication use  Z79.899 Prolactin    TSH    Hemoglobin A1C    Vitamin B12    Past Psychiatric History: I have reviewed past psychiatric history from my progress note on 12/25/2017  Past Medical History:  Past Medical History:  Diagnosis Date  . Bipolar disorder (HCC)   . Chicken pox   . Glaucoma   . Heart murmur   . History of blood transfusion   . Hypertension   . Mumps   . Pelvis fracture [facility])     Past Surgical History:  Procedure Laterality Date  . ADENOIDECTOMY    . CATARACT EXTRACTION W/PHACO Right 01/13/2016   Procedure: CATARACT EXTRACTION PHACO AND INTRAOCULAR LENS PLACEMENT (IOC);  Surgeon: Lockie Mola, MD;  Location: [facility];  Service: Ophthalmology;  Laterality: Right;  . CATARACT EXTRACTION W/PHACO Left 09/07/2016   Procedure: CATARACT EXTRACTION PHACO AND INTRAOCULAR LENS PLACEMENT (IOC);  Surgeon: Lockie Mola, MD;  Location: [facility];  Service: Ophthalmology;  Laterality: Left;  PT WOULD LIKE LATER APPT  . CHOLECYSTECTOMY    . EYE SURGERY    . PENILE PROSTHESIS IMPLANT    . 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 Arthritis Mother   . Hypertension Mother   . Anxiety disorder Mother   . Depression Mother   . Lung cancer Mother   . Arthritis Father   . Hypertension Father   . Diabetes Father   . Lung cancer Father   . Prostate cancer Father   . Diabetes Sister   . Leukemia Sister     Social History: I have reviewed social history from my  progress note on 12/25/2017 Social History   Socioeconomic History  . Marital status: Single    Spouse name: Not on file  . Number of children: Not on file  . Years of education: Not on file  . Highest education level: Not on file  Occupational History  . Not on file  Tobacco Use  . Smoking status: Never Smoker  . Smokeless tobacco: Never Used  Vaping Use  . Vaping Use: Never used  Substance and Sexual Activity  . Alcohol use: No    Alcohol/week: 0.0 standard drinks  . Drug use: No  . Sexual activity: Not Currently  Other Topics Concern  . Not on file  Social History Narrative  . Not on file   Social Determinants of Health   Financial Resource Strain: Not on file  Food Insecurity: Not on file  Transportation Needs: Not on file  Physical Activity: Not on file  Stress: Not on file  Social Connections: Not on file    Allergies: No Known Allergies  Metabolic Disorder Labs: Lab Results  Component Value Date   HGBA1C 5.6 05/23/2017   No results found for: PROLACTIN Lab Results  Component Value Date   CHOL 190 04/21/2020   TRIG 67.0 04/21/2020   HDL 47.60 04/21/2020   CHOLHDL 4 04/21/2020   VLDL 13.4 04/21/2020   LDLCALC 129 (H) 04/21/2020   LDLCALC 99 04/11/2019   No results found for: TSH  Therapeutic Level Labs: No results found for: LITHIUM No results found for: VALPROATE No components found for:  CBMZ  Current Medications: Current Outpatient Medications  Medication Sig Dispense Refill  . amLODipine (NORVASC) 10 MG tablet Take 1 tablet (10 mg total) by mouth daily. 90 tablet 3  . ASPIRIN 81 PO Take 81 mg by mouth daily.    Marland Kitchen atorvastatin (LIPITOR) 40 MG tablet Take 1 tablet (40 mg total) by mouth daily. 90 tablet 3  . cholecalciferol (VITAMIN D) 1000 units tablet Take 1,000 Units by mouth daily.    Marland Kitchen escitalopram (LEXAPRO) 10 MG tablet TAKE 1 AND 1/2 TABLETS BY  MOUTH DAILY 135 tablet 3  . ferrous sulfate 325 (65 FE) MG tablet TAKE 1 TABLET BY MOUTH  TWICE A DAY 60 tablet 3  . lamoTRIgine (LAMICTAL) 25 MG tablet TAKE 1 TABLET BY MOUTH  TWICE DAILY 180 tablet 3  . NEOMYCIN-POLYMYXIN-HYDROCORTISONE (CORTISPORIN) 1 % SOLN OTIC solution Apply 1-2 drops to toe BID after soaking 10 mL 1  . QUEtiapine (SEROQUEL) 25 MG tablet Take 1 tablet (25 mg total) by mouth at bedtime. 90 tablet 0  . rivastigmine (EXELON) 6 MG capsule TAKE 1 CAPSULE BY MOUTH  TWICE DAILY 180 capsule 3   No current facility-administered medications for this visit.     Musculoskeletal: Strength & Muscle Tone: UTA Gait & Station: UTA Patient leans: N/A  Psychiatric Specialty Exam: Review of Systems  Musculoskeletal:       Knee pain -  1/10 - rt sided   Psychiatric/Behavioral: Negative for agitation, behavioral problems, confusion, decreased concentration, dysphoric mood, hallucinations, self-injury, sleep disturbance and suicidal ideas. The patient is not nervous/anxious and is not hyperactive.   All other systems reviewed and are negative.   There were no vitals taken for this visit.There is no height or weight on file to calculate BMI.  General Appearance: UTA  Eye Contact:  UTA  Speech:  Clear and Coherent  Volume:  Normal  Mood:  Euthymic  Affect:  UTA  Thought Process:  Goal Directed and Descriptions of Associations: Intact  Orientation:  Full (Time, Place, and Person)  Thought Content: Logical   Suicidal Thoughts:  No  Homicidal Thoughts:  No  Memory:  Immediate;   Fair Recent;   Fair Remote;   Limited  Judgement:  Fair  Insight:  Fair  Psychomotor Activity:  UTA  Concentration:  Concentration: Fair and Attention Span: Fair  Recall:  [employer] of Knowledge: Fair  Language: Fair  Akathisia:  No  Handed:  Right  AIMS (if indicated): UTA  Assets:  Communication Skills Desire for Improvement Housing  ADL's:  Intact  Cognition: WNL  Sleep:  Fair   Screenings: Mini-Mental   Flowsheet Row Office Visit from 11/18/2016 in [facility]  Arab  Total Score (max 30 points ) 30    PHQ2-9   Flowsheet Row Video Visit from 12/10/2020 in [facility] Clinical Support from 04/03/2020 in [facility] Office Visit from 10/21/2019 in [facility] [place] Clinical Support from 03/21/2019 in [facility] [place] Clinical Support from 11/21/2017 in [facility] [place]  PHQ-2 Total Score 0 0 0 0 0  PHQ-9 Total Score - 0 - - -    Flowsheet Row Video Visit from 12/10/2020 in [facility]  C-SSRS RISK CATEGORY No Risk       Assessment and Plan: Firas Guardado. is a 75 year old Caucasian male, lives in [place], has a history of cognitive disorder, bipolar disorder was evaluated by telemedicine today.  Patient is currently stable on current medication regimen.  Plan Bipolar disorder in remission Seroquel 25 mg p.o. nightly-reduced dosage Lexapro 15 mg p.o. daily Lamotrigine 25 mg p.o. twice daily  Cognitive disorder, mild multifactorial-stable Exelon 6 mg p.o. twice daily  High-risk medication use-patient will continue to benefit from following labs-TSH, hemoglobin A1c, prolactin, vitamin B12. We will get it as his primary care office.  Patient to follow-up with his primary care provider for his knee pain, reports it doesn't affect him much.  Follow-up in clinic in 3 months or sooner in office.  I have spent atleast 20 minutes non face to face with patient today. More than 50 % of the time was spent for preparing to see the patient ( e.g., review of test, records ), ordering medications and test ,psychoeducation and supportive psychotherapy and care coordination,as well as documenting clinical information in electronic health record. This note was generated in part or whole with voice recognition software. Voice recognition is usually quite accurate but there are transcription errors that can and very often do occur. I  apologize for any typographical errors that were not detected and corrected.       Jomarie Longs, MD 12/10/2020, 9:33 PM

## 2020-12-15 ENCOUNTER — Other Ambulatory Visit: Payer: Self-pay

## 2020-12-15 ENCOUNTER — Ambulatory Visit (INDEPENDENT_AMBULATORY_CARE_PROVIDER_SITE_OTHER): Payer: Medicare Other

## 2020-12-15 DIAGNOSIS — Z23 Encounter for immunization: Secondary | ICD-10-CM

## 2021-01-13 ENCOUNTER — Other Ambulatory Visit: Payer: Self-pay

## 2021-01-15 ENCOUNTER — Other Ambulatory Visit: Payer: Self-pay

## 2021-01-15 ENCOUNTER — Encounter: Payer: Self-pay | Admitting: Family Medicine

## 2021-01-15 ENCOUNTER — Ambulatory Visit (INDEPENDENT_AMBULATORY_CARE_PROVIDER_SITE_OTHER): Payer: Medicare Other | Admitting: Family Medicine

## 2021-01-15 ENCOUNTER — Other Ambulatory Visit
Admission: RE | Admit: 2021-01-15 | Discharge: 2021-01-15 | Disposition: A | Discharge status: Home / Self Care | Payer: Medicare Other | Attending: Family Medicine | Admitting: Family Medicine

## 2021-01-15 VITALS — BP 138/62 | HR 79 | Temp 99.6°F | Ht 70.0 in | Wt 194.8 lb

## 2021-01-15 DIAGNOSIS — E785 Hyperlipidemia, unspecified: Secondary | ICD-10-CM | POA: Diagnosis present

## 2021-01-15 DIAGNOSIS — I10 Essential (primary) hypertension: Secondary | ICD-10-CM

## 2021-01-15 DIAGNOSIS — E611 Iron deficiency: Secondary | ICD-10-CM

## 2021-01-15 DIAGNOSIS — E663 Overweight: Secondary | ICD-10-CM | POA: Insufficient documentation

## 2021-01-15 DIAGNOSIS — R079 Chest pain, unspecified: Secondary | ICD-10-CM | POA: Insufficient documentation

## 2021-01-15 DIAGNOSIS — Z79899 Other long term (current) drug therapy: Secondary | ICD-10-CM | POA: Diagnosis not present

## 2021-01-15 DIAGNOSIS — Z0001 Encounter for general adult medical examination with abnormal findings: Secondary | ICD-10-CM

## 2021-01-15 DIAGNOSIS — I447 Left bundle-branch block, unspecified: Secondary | ICD-10-CM

## 2021-01-15 LAB — COMPREHENSIVE METABOLIC PANEL
ALT: 24 U/L (ref 0–44)
AST: 24 U/L (ref 15–41)
Albumin: 3.8 g/dL (ref 3.5–5.0)
Alkaline Phosphatase: 145 U/L — ABNORMAL HIGH (ref 38–126)
Anion gap: 8 (ref 5–15)
BUN: 15 mg/dL (ref 8–23)
CO2: 26 mmol/L (ref 22–32)
Calcium: 9 mg/dL (ref 8.9–10.3)
Chloride: 103 mmol/L (ref 98–111)
Creatinine, Ser: 1.42 mg/dL — ABNORMAL HIGH (ref 0.61–1.24)
GFR, Estimated: 51 mL/min — ABNORMAL LOW (ref >60)
Glucose, Bld: 124 mg/dL — ABNORMAL HIGH (ref 70–99)
Potassium: 4 mmol/L (ref 3.5–5.1)
Sodium: 137 mmol/L (ref 135–145)
Total Bilirubin: 2.1 mg/dL — ABNORMAL HIGH (ref 0.3–1.2)
Total Protein: 7.7 g/dL (ref 6.5–8.1)

## 2021-01-15 LAB — TSH: TSH: 2.597 u[IU]/mL (ref 0.350–4.500)

## 2021-01-15 LAB — LIPID PANEL
Cholesterol: 158 mg/dL (ref 0–200)
HDL: 45 mg/dL (ref >40)
LDL Cholesterol: 98 mg/dL (ref 0–99)
Total CHOL/HDL Ratio: 3.5 RATIO
Triglycerides: 77 mg/dL (ref <150)
VLDL: 15 mg/dL (ref 0–40)

## 2021-01-15 LAB — HEMOGLOBIN A1C
Hgb A1c MFr Bld: 5 % (ref 4.8–5.6)
Mean Plasma Glucose: 96.8 mg/dL

## 2021-01-15 LAB — CBC
HCT: 40 % (ref 39.0–52.0)
Hemoglobin: 13.7 g/dL (ref 13.0–17.0)
MCH: 32.3 pg (ref 26.0–34.0)
MCHC: 34.3 g/dL (ref 30.0–36.0)
MCV: 94.3 fL (ref 80.0–100.0)
Platelets: 205 10*3/uL (ref 150–400)
RBC: 4.24 MIL/uL (ref 4.22–5.81)
RDW: 12.7 % (ref 11.5–15.5)
WBC: 9.1 10*3/uL (ref 4.0–10.5)
nRBC: 0 % (ref 0.0–0.2)

## 2021-01-15 LAB — TROPONIN I (HIGH SENSITIVITY): Troponin I (High Sensitivity): 19 ng/L — ABNORMAL HIGH (ref <18)

## 2021-01-15 NOTE — Assessment & Plan Note (Signed)
Physical exam completed.  Encouraged healthy diet and continued walking for exercise.  We have referred him to GI to complete colonoscopy given history of iron deficiency.  He did not complete this previously.  He has aged out of prostate cancer screening.  I encouraged him to get his Covid vaccine booster as well as his Shingrix vaccine.  I encouraged him to see a dentist.  He will see his eye doctor for his vision changes as they are not acute changes.  Lab work as outlined.

## 2021-01-15 NOTE — Assessment & Plan Note (Signed)
His chest pain is very atypical.  It occurred after he laid down and resolved with getting up in the morning.  He is asymptomatic at this time.  His EKG does reveal left bundle branch block for which she will be referred to cardiology.  Given the left bundle branch block and his prior symptoms we will get a troponin to evaluate for cardiac cause though I have low suspicion for cardiac cause.  Suspect this is likely GERD given his description.  He was advised he needed to have the troponin done today.  Advised to seek medical attention if he has any recurrence of symptoms.

## 2021-01-15 NOTE — Progress Notes (Signed)
Danny Rumps, MD Phone: [phone]  Danny Mcgrath. is a 75 y.o. male who presents today for CPE.  Diet: Sandwiches, soup, [place] food, some vegetables, some fruits, not much soda. Exercise: Walks for exercise Colonoscopy: Due Prostate cancer screening: Aged out Family history-  Prostate cancer: father  Colon cancer: no Vaccines-   Flu: Up-to-date  Tetanus: Up-to-date  Shingles: Due  COVID19: Due for fourth vaccine  Pneumonia: Up-to-date HIV screening: Aged out Hep C Screening: Up-to-date Tobacco use: No Alcohol use: Rare Illicit Drug use: No Dentist: Looking for dentist that takes his insurance Ophthalmology: Yes  Chest pain: Patient with chest pain that occurred last night after he went to lay down.  It worsened while he was laying down though once he got up this morning it resolved.  It was in the center upper portion of his sternum.  He notes no pain now.  He ate a cheese steak around 2 PM and went to bed around 11 PM.  He had no shortness of breath.  No diaphoresis.  No exertional component.  No radiation.  Notes it made him feel as though he wanted to cough.  No history of this in the past.  Vision changes: Patient notes over the last 6 months he has had what seems to be a cloud in front of his right eye that makes things blurry.  Notes his left eye seems fine.  He notes both eyes seem to have fireworks when he looks directly at a light.  He did have cataract surgery in 2019.  He has an appointment with his ophthalmologist in about a month.  Active Ambulatory Problems    Diagnosis Date Noted  . Glaucoma 07/24/2015  . Heart murmur, systolic 02/63/7858  . HTN (hypertension) 07/24/2015  . Encounter for general adult medical examination with abnormal findings 07/24/2015  . Hyperlipidemia 11/19/2015  . 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 CKD (chronic kidney disease) stage 2, GFR 60-89 ml/min 11/18/2016  . 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 Lower  extremity edema 05/24/2017  . Family history of prostate cancer in father 10/25/2017  . Vertigo 10/25/2017  . Poor dentition 10/25/2017  . Ventral hernia without obstruction or gangrene 04/23/2018  . Iron deficiency 04/23/2018  . Vitamin D deficiency 04/23/2018  . Onychomycosis 04/23/2018  . Overweight 03/21/2019  . Bipolar 2 disorder ([place]) 06/25/2019  . Mild cognitive disorder 06/25/2019  . Falls 10/24/2019  . Bipolar disorder in remission ([place]) 10/28/2019  . Bipolar disorder, in full remission, most recent episode depressed ([place]) 10/28/2019  . No-show for appointment 08/06/2020  . High risk medication use 12/10/2020  . Chest pain 01/15/2021   Resolved Ambulatory Problems    Diagnosis Date Noted  . Decreased GFR 11/19/2015   Past Medical History:  Diagnosis Date  . Bipolar disorder ([place])   . Chicken pox   . Heart murmur   . History of blood transfusion   . Hypertension   . Mumps   . Pelvis fracture (HCC)     Family History  Problem Relation Age of Onset  . Arthritis Mother   . Hypertension Mother   . Anxiety disorder Mother   . Depression Mother   . Lung cancer Mother   . Arthritis Father   . Hypertension Father   . Diabetes Father   . Lung cancer Father   . Prostate cancer Father   . Diabetes Sister   . Leukemia Sister     Social History   Socioeconomic History  . Marital  status: Single    Spouse name: Not on file  . Number of children: Not on file  . Years of education: Not on file  . Highest education level: Not on file  Occupational History  . Not on file  Tobacco Use  . Smoking status: Never Smoker  . Smokeless tobacco: Never Used  Vaping Use  . Vaping Use: Never used  Substance and Sexual Activity  . Alcohol use: No    Alcohol/week: 0.0 standard drinks  . Drug use: No  . Sexual activity: Not Currently  Other Topics Concern  . Not on file  Social History Narrative  . Not on file   Social Determinants of Health   Financial Resource  Strain: Not on file  Food Insecurity: Not on file  Transportation Needs: Not on file  Physical Activity: Not on file  Stress: Not on file  Social Connections: Not on file  Intimate Partner Violence: Not on file    ROS  General:  Negative for nexplained weight loss, fever Skin: Negative for new or changing mole, sore that won't heal HEENT: Positive for trouble seeing, negative for trouble hearing, ringing in ears, mouth sores, hoarseness, change in voice, dysphagia. CV: Positive for chest pain, negative dyspnea, edema, palpitations Resp: Negative for cough, dyspnea, hemoptysis GI: Negative for nausea, vomiting, diarrhea, constipation, abdominal pain, melena, hematochezia. GU: Negative for dysuria, incontinence, urinary hesitance, hematuria, vaginal or penile discharge, polyuria, sexual difficulty, lumps in testicle or breasts MSK: Negative for muscle cramps or aches, joint pain or swelling Neuro: Negative for headaches, weakness, numbness, dizziness, passing out/fainting Psych: Negative for depression, anxiety, memory problems  Objective  Physical Exam Vitals:   01/15/21 1401  BP: 138/62  Pulse: 79  Temp: 99.6 F (37.6 C)  SpO2: 94%    BP Readings from Last 3 Encounters:  01/15/21 138/62  04/21/20 120/80  04/03/20 122/83   Wt Readings from Last 3 Encounters:  01/15/21 194 lb 12.8 oz (88.4 kg)  04/21/20 198 lb 12.8 oz (90.2 kg)  04/03/20 196 lb (88.9 kg)    Physical Exam Constitutional:      General: He is not in acute distress.    Appearance: He is not diaphoretic.  HENT:     Head: Normocephalic and atraumatic.  Eyes:     Conjunctiva/sclera: Conjunctivae normal.     Pupils: Pupils are equal, round, and reactive to light.  Cardiovascular:     Rate and Rhythm: Normal rate and regular rhythm.     Heart sounds: Normal heart sounds.  Pulmonary:     Effort: Pulmonary effort is normal.     Breath sounds: Normal breath sounds.  Abdominal:     General: Bowel sounds  are normal. There is no distension.     Palpations: Abdomen is soft.     Tenderness: There is no abdominal tenderness. There is no guarding or rebound.  Musculoskeletal:     Right lower leg: No edema.     Left lower leg: No edema.  Lymphadenopathy:     Cervical: No cervical adenopathy.  Skin:    General: Skin is warm and dry.  Neurological:     Mental Status: He is alert.  Psychiatric:        Mood and Affect: Mood normal.    EKG: Sinus rhythm with sinus arrhythmia, rate 64, left bundle branch block appears to be new  Assessment/Plan:   Problem List Items Addressed This Visit    Chest pain    His chest pain is  very atypical.  It occurred after he laid down and resolved with getting up in the morning.  He is asymptomatic at this time.  His EKG does reveal left bundle branch block for which she will be referred to cardiology.  Given the left bundle branch block and his prior symptoms we will get a troponin to evaluate for cardiac cause though I have low suspicion for cardiac cause.  Suspect this is likely GERD given his description.  He was advised he needed to have the troponin done today.  Advised to seek medical attention if he has any recurrence of symptoms.      Relevant Orders   EKG 12-Lead   Troponin I (High Sensitivity)   CBC   TSH   Encounter for general adult medical examination with abnormal findings - Primary    Physical exam completed.  Encouraged healthy diet and continued walking for exercise.  We have referred him to GI to complete colonoscopy given history of iron deficiency.  He did not complete this previously.  He has aged out of prostate cancer screening.  I encouraged him to get his Covid vaccine booster as well as his Shingrix vaccine.  I encouraged him to see a dentist.  He will see his eye doctor for his vision changes as they are not acute changes.  Lab work as outlined.      HTN (hypertension)   Relevant Orders   Comp Met (CMET)   Hyperlipidemia    Relevant Orders   Lipid panel   Iron deficiency   Relevant Orders   Ambulatory referral to Gastroenterology   Overweight   Relevant Orders   HgB A1c    Other Visit Diagnoses    Left bundle branch block       Relevant Orders   Ambulatory referral to Cardiology      This visit occurred during the SARS-CoV-2 public health emergency.  Safety protocols were in place, including screening questions prior to the visit, additional usage of staff PPE, and extensive cleaning of exam room while observing appropriate contact time as indicated for disinfecting solutions.    Danny Rumps, MD [place]

## 2021-01-15 NOTE — Progress Notes (Signed)
Tried to reach patient agin by phone. Called both house and cell phone.

## 2021-01-15 NOTE — Patient Instructions (Signed)
Nice to see you. We will have you go to the medical mall at the hospital for lab work.  I will get you referred to cardiology for evaluation as well.  Please see your eye doctor as planned.

## 2021-01-18 ENCOUNTER — Other Ambulatory Visit: Payer: Self-pay | Admitting: Family Medicine

## 2021-01-18 ENCOUNTER — Other Ambulatory Visit: Payer: Self-pay | Admitting: Psychiatry

## 2021-01-18 ENCOUNTER — Telehealth: Payer: Self-pay | Admitting: Family Medicine

## 2021-01-18 DIAGNOSIS — F3176 Bipolar disorder, in full remission, most recent episode depressed: Secondary | ICD-10-CM

## 2021-01-18 NOTE — Telephone Encounter (Signed)
Elise Benne, CMA noted he contacted the patient.  He noted that the patient was not having any symptoms over the weekend.  Based on my result note from last week he advised the patient to go to the emergency room.  I spoke with Mal Amabile and asked him to call the patient back and if he can get in touch with him he should advise the patient that he did not need to go to the emergency department at this time given his lack of symptoms.  If anything high think we could repeat a troponin and he would just need to see cardiology.

## 2021-01-18 NOTE — Telephone Encounter (Signed)
Left message for patient to return call back.  

## 2021-01-22 ENCOUNTER — Emergency Department
Admission: EM | Admit: 2021-01-22 | Discharge: 2021-01-22 | Disposition: A | Discharge status: Home / Self Care | Payer: Medicare Other | Attending: Emergency Medicine | Admitting: Emergency Medicine

## 2021-01-22 ENCOUNTER — Other Ambulatory Visit: Payer: Self-pay

## 2021-01-22 DIAGNOSIS — R7989 Other specified abnormal findings of blood chemistry: Secondary | ICD-10-CM | POA: Diagnosis not present

## 2021-01-22 DIAGNOSIS — Z79899 Other long term (current) drug therapy: Secondary | ICD-10-CM | POA: Diagnosis not present

## 2021-01-22 DIAGNOSIS — R778 Other specified abnormalities of plasma proteins: Secondary | ICD-10-CM

## 2021-01-22 DIAGNOSIS — R0789 Other chest pain: Secondary | ICD-10-CM | POA: Insufficient documentation

## 2021-01-22 DIAGNOSIS — Z7982 Long term (current) use of aspirin: Secondary | ICD-10-CM | POA: Insufficient documentation

## 2021-01-22 DIAGNOSIS — I129 Hypertensive chronic kidney disease with stage 1 through stage 4 chronic kidney disease, or unspecified chronic kidney disease: Secondary | ICD-10-CM | POA: Insufficient documentation

## 2021-01-22 DIAGNOSIS — N182 Chronic kidney disease, stage 2 (mild): Secondary | ICD-10-CM | POA: Diagnosis not present

## 2021-01-22 LAB — COMPREHENSIVE METABOLIC PANEL
ALT: 20 U/L (ref 0–44)
AST: 26 U/L (ref 15–41)
Albumin: 3.8 g/dL (ref 3.5–5.0)
Alkaline Phosphatase: 126 U/L (ref 38–126)
Anion gap: 8 (ref 5–15)
BUN: 15 mg/dL (ref 8–23)
CO2: 27 mmol/L (ref 22–32)
Calcium: 9.3 mg/dL (ref 8.9–10.3)
Chloride: 105 mmol/L (ref 98–111)
Creatinine, Ser: 1.27 mg/dL — ABNORMAL HIGH (ref 0.61–1.24)
GFR, Estimated: 59 mL/min — ABNORMAL LOW (ref >60)
Glucose, Bld: 82 mg/dL (ref 70–99)
Potassium: 3.7 mmol/L (ref 3.5–5.1)
Sodium: 140 mmol/L (ref 135–145)
Total Bilirubin: 1 mg/dL (ref 0.3–1.2)
Total Protein: 7.4 g/dL (ref 6.5–8.1)

## 2021-01-22 LAB — CBC WITH DIFFERENTIAL/PLATELET
Abs Immature Granulocytes: 0.02 10*3/uL (ref 0.00–0.07)
Basophils Absolute: 0.1 10*3/uL (ref 0.0–0.1)
Basophils Relative: 1 %
Eosinophils Absolute: 0.2 10*3/uL (ref 0.0–0.5)
Eosinophils Relative: 3 %
HCT: 40.4 % (ref 39.0–52.0)
Hemoglobin: 13.6 g/dL (ref 13.0–17.0)
Immature Granulocytes: 0 %
Lymphocytes Relative: 19 %
Lymphs Abs: 1.3 10*3/uL (ref 0.7–4.0)
MCH: 31.6 pg (ref 26.0–34.0)
MCHC: 33.7 g/dL (ref 30.0–36.0)
MCV: 94 fL (ref 80.0–100.0)
Monocytes Absolute: 0.6 10*3/uL (ref 0.1–1.0)
Monocytes Relative: 10 %
Neutro Abs: 4.3 10*3/uL (ref 1.7–7.7)
Neutrophils Relative %: 67 %
Platelets: 203 10*3/uL (ref 150–400)
RBC: 4.3 MIL/uL (ref 4.22–5.81)
RDW: 12.5 % (ref 11.5–15.5)
WBC: 6.4 10*3/uL (ref 4.0–10.5)
nRBC: 0 % (ref 0.0–0.2)

## 2021-01-22 LAB — TROPONIN I (HIGH SENSITIVITY): Troponin I (High Sensitivity): 15 ng/L (ref <18)

## 2021-01-22 NOTE — ED Provider Notes (Signed)
[facility] Emergency Department Provider Note   ____________________________________________   Event Date/Time   First MD Initiated Contact with Patient 01/22/21 1207     (approximate)  I have reviewed the triage vital signs and the nursing notes.   HISTORY  Chief Complaint Abnormal Lab    HPI Danny Mcgrath. is a 75 y.o. male with a past medical history of bipolar disorder, glaucoma, hypertension, and heart murmur who presents after being called by his doctor for a lab work that was done 4 days prior to arrival that was "abnormal".  Patient does not know which lab abnormalities his physician was concerned about however per nursing notes, patient's troponin was elevated.  Patient does state that approximately 1 week prior to arrival he had an episode overnight of chest pressure that resolved spontaneously and he has not experienced any episodes similar to this since that time.  Patient denies any history of coronary artery disease or arrhythmia.  Patient is never been evaluated for cardiac abnormalities in the past.  Patient currently denies any vision changes, tinnitus, difficulty speaking, facial droop, sore throat, chest pain, shortness of breath, abdominal pain, nausea/vomiting/diarrhea, dysuria, or weakness/numbness/paresthesias in any extremity         Past Medical History:  Diagnosis Date  . Bipolar disorder (HCC)   . Chicken pox   . Glaucoma   . Heart murmur   . History of blood transfusion   . Hypertension   . Mumps   . Pelvis fracture [facility])     Patient Active Problem List   Diagnosis Date Noted  . Chest pain 01/15/2021  . Left bundle branch block 01/15/2021  . High risk medication use 12/10/2020  . No-show for appointment 08/06/2020  . Bipolar disorder in remission (HCC) 10/28/2019  . Bipolar disorder, in full remission, most recent episode depressed (HCC) 10/28/2019  . Falls 10/24/2019  . Bipolar 2 disorder (HCC) 06/25/2019  .  Mild cognitive disorder 06/25/2019  . Overweight 03/21/2019  . Ventral hernia without obstruction or gangrene 04/23/2018  . Iron deficiency 04/23/2018  . Vitamin D deficiency 04/23/2018  . Onychomycosis 04/23/2018  . Family history of prostate cancer in father 10/25/2017  . Vertigo 10/25/2017  . Poor dentition 10/25/2017  . Lower extremity edema 05/24/2017  . 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 CKD (chronic kidney disease) stage 2, GFR 60-89 ml/min 11/18/2016  . Undifferentiated schizophrenia (HCC) 05/18/2016  . Hyperlipidemia 11/19/2015  . Glaucoma 07/24/2015  . Heart murmur, systolic 07/24/2015  . HTN (hypertension) 07/24/2015  . Encounter for general adult medical examination with abnormal findings 07/24/2015    Past Surgical History:  Procedure Laterality Date  . ADENOIDECTOMY    . CATARACT EXTRACTION W/PHACO Right 01/13/2016   Procedure: CATARACT EXTRACTION PHACO AND INTRAOCULAR LENS PLACEMENT (IOC);  Surgeon: Lockie Mola, MD;  Location: [facility];  Service: Ophthalmology;  Laterality: Right;  . CATARACT EXTRACTION W/PHACO Left 09/07/2016   Procedure: CATARACT EXTRACTION PHACO AND INTRAOCULAR LENS PLACEMENT (IOC);  Surgeon: Lockie Mola, MD;  Location: [facility];  Service: Ophthalmology;  Laterality: Left;  PT WOULD LIKE LATER APPT  . CHOLECYSTECTOMY    . EYE SURGERY    . PENILE PROSTHESIS IMPLANT    . 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 Authorizing Provider  amLODipine (NORVASC) 10 MG tablet Take 1 tablet (10 mg total)  by mouth daily. 11/24/20   Glori Luis, MD  ASPIRIN 81 PO Take 81 mg by mouth daily.    [provider]  atorvastatin (LIPITOR) 40 MG tablet Take 1 tablet (40 mg total) by mouth daily. 03/20/19   Glori Luis, MD  cholecalciferol (VITAMIN D) 1000 units tablet Take 1,000 Units  by mouth daily.    [provider]  escitalopram (LEXAPRO) 10 MG tablet TAKE 1 AND 1/2 TABLETS BY  MOUTH DAILY 09/07/20   Jomarie Longs, MD  ferrous sulfate 325 (65 FE) MG tablet TAKE 1 TABLET BY MOUTH TWICE A DAY 06/19/18   Glori Luis, MD  lamoTRIgine (LAMICTAL) 25 MG tablet TAKE 1 TABLET BY MOUTH  TWICE DAILY 09/07/20   Jomarie Longs, MD  NEOMYCIN-POLYMYXIN-HYDROCORTISONE (CORTISPORIN) 1 % SOLN OTIC solution Apply 1-2 drops to toe BID after soaking 04/10/19   Hyatt, Max T, DPM  QUEtiapine (SEROQUEL) 25 MG tablet TAKE 1 TABLET BY MOUTH AT  BEDTIME 01/19/21   Jomarie Longs, MD  rivastigmine (EXELON) 6 MG capsule TAKE 1 CAPSULE BY MOUTH  TWICE DAILY 09/07/20   Jomarie Longs, MD    Allergies Patient has no known allergies.  Family History  Problem Relation Age of Onset  . Arthritis Mother   . Hypertension Mother   . Anxiety disorder Mother   . Depression Mother   . Lung cancer Mother   . Arthritis Father   . Hypertension Father   . Diabetes Father   . Lung cancer Father   . Prostate cancer Father   . Diabetes Sister   . Leukemia Sister     Social History Social History   Tobacco Use  . Smoking status: Never Smoker  . Smokeless tobacco: Never Used  Vaping Use  . Vaping Use: Never used  Substance Use Topics  . Alcohol use: No    Alcohol/week: 0.0 standard drinks  . Drug use: No    Review of Systems Constitutional: No fever/chills Eyes: No visual changes. ENT: No sore throat. Cardiovascular: Endorses previous episode of chest pain. Respiratory: Denies shortness of breath. Gastrointestinal: No abdominal pain.  No nausea, no vomiting.  No diarrhea. Genitourinary: Negative for dysuria. Musculoskeletal: Negative for acute arthralgias Skin: Negative for rash. Neurological: Negative for headaches, weakness/numbness/paresthesias in any extremity Psychiatric: Negative for suicidal ideation/homicidal  ideation   ____________________________________________   PHYSICAL EXAM:  VITAL SIGNS: ED Triage Vitals  Enc Vitals Group     BP 01/22/21 1145 123/81     Pulse Rate 01/22/21 1145 63     Resp 01/22/21 1145 18     Temp 01/22/21 1145 98.6 F (37 C)     Temp Source 01/22/21 1145 Oral     SpO2 01/22/21 1145 95 %     Weight 01/22/21 1149 190 lb (86.2 kg)     Height 01/22/21 1149 5\' 10"  (1.778 m)     Head Circumference --      Peak Flow --      Pain Score 01/22/21 1149 0     Pain Loc --      Pain Edu? --      Excl. in GC? --    Constitutional: Alert and oriented. Well appearing elderly overweight Caucasian male in no acute distress. Eyes: Conjunctivae are normal. PERRL. Head: Atraumatic. Nose: No congestion/rhinnorhea. Mouth/Throat: Mucous membranes are moist. Neck: No stridor Cardiovascular: Grossly normal heart sounds.  Good peripheral circulation. Respiratory: Normal respiratory effort.  No retractions. Gastrointestinal: Soft and nontender. No distention. Musculoskeletal: No  obvious deformities Neurologic:  Normal speech and language. No gross focal neurologic deficits are appreciated. Skin:  Skin is warm and dry. No rash noted. Psychiatric: Mood and affect are normal. Speech and behavior are normal.  ____________________________________________   LABS (all labs ordered are listed, but only abnormal results are displayed)  Labs Reviewed  COMPREHENSIVE METABOLIC PANEL - Abnormal; Notable for the following components:      Result Value   Creatinine, Ser 1.27 (*)    GFR, Estimated 59 (*)    All other components within normal limits  CBC WITH DIFFERENTIAL/PLATELET  TROPONIN I (HIGH SENSITIVITY)  TROPONIN I (HIGH SENSITIVITY)    PROCEDURES  Procedure(s) performed (including Critical Care):  .1-3 Lead EKG Interpretation Performed by: Merwyn Katos, MD Authorized by: Merwyn Katos, MD     Interpretation: normal     ECG rate:  62   ECG rate assessment:  normal     Rhythm: sinus rhythm     Ectopy: none     Conduction: normal       ____________________________________________   INITIAL IMPRESSION / ASSESSMENT AND PLAN / ED COURSE  As part of my medical decision making, I reviewed the following data within the electronic MEDICAL RECORD NUMBER Nursing notes reviewed and incorporated, Labs reviewed, Old chart reviewed, and Notes from prior ED visits reviewed and incorporated        Patient 75 year old male who presents for abnormal labs found on routine blood work that was performed 4 days prior to arrival.  Patient's laboratory evaluation here is without any red flag abnormalities.  Patient's troponin is normal.  Patient denies any subsequent chest pain since this episode 1 week prior to arrival.  Given negative work-up, patient was referred to the on-call cardiologist, Dr. Lady Gary, for outpatient cardiac risk stratification.  The patient has been reexamined and is ready to be discharged.  All diagnostic results have been reviewed and discussed with the patient/family.  Care plan has been outlined and the patient/family understands all current diagnoses, results, and treatment plans.  There are no new complaints, changes, or physical findings at this time.  All questions have been addressed and answered.  Patient was instructed to, and agrees to follow-up with their primary care physician as well as return to the emergency department if any new or worsening symptoms develop.      ____________________________________________   FINAL CLINICAL IMPRESSION(S) / ED DIAGNOSES  Final diagnoses:  Chest pressure  Elevated troponin I measurement     ED Discharge Orders    None       Note:  This document was prepared using Dragon voice recognition software and may include unintentional dictation errors.   Merwyn Katos, MD 01/22/21 1332

## 2021-01-22 NOTE — ED Triage Notes (Signed)
Pt states he has some blood work done on Monday and was called by his doctor and told to come to the hospital due to some abnormal results- pt cannot remember name of lab, but troponin was elevated per pt chart

## 2021-01-27 ENCOUNTER — Other Ambulatory Visit: Payer: Self-pay | Admitting: Family Medicine

## 2021-01-27 DIAGNOSIS — N179 Acute kidney failure, unspecified: Secondary | ICD-10-CM

## 2021-02-05 ENCOUNTER — Other Ambulatory Visit: Payer: Medicare Other

## 2021-02-08 ENCOUNTER — Other Ambulatory Visit: Payer: Medicare Other

## 2021-02-16 ENCOUNTER — Other Ambulatory Visit: Payer: Medicare Other

## 2021-02-23 ENCOUNTER — Ambulatory Visit: Payer: Medicare Other | Admitting: Gastroenterology

## 2021-02-24 ENCOUNTER — Other Ambulatory Visit: Payer: Medicare Other

## 2021-03-01 ENCOUNTER — Other Ambulatory Visit: Payer: Medicare Other

## 2021-03-03 ENCOUNTER — Telehealth: Payer: Self-pay | Admitting: Family Medicine

## 2021-03-03 DIAGNOSIS — F3176 Bipolar disorder, in full remission, most recent episode depressed: Secondary | ICD-10-CM

## 2021-03-03 MED ORDER — AMLODIPINE BESYLATE 10 MG PO TABS: 10.0000 mg | ORAL_TABLET | Freq: Every day | ORAL | 3 refills | Status: DC

## 2021-03-03 MED ORDER — QUETIAPINE FUMARATE 25 MG PO TABS: 25.0000 mg | ORAL_TABLET | Freq: Every day | ORAL | 3 refills | Status: DC

## 2021-03-03 NOTE — Telephone Encounter (Signed)
Patient is out of refills on his QUEtiapine (SEROQUEL) 25 MG tablet and a refill on his amLODipine (NORVASC) 10 MG tablet

## 2021-03-09 ENCOUNTER — Other Ambulatory Visit: Payer: Self-pay

## 2021-03-09 ENCOUNTER — Encounter: Payer: Self-pay | Admitting: Psychiatry

## 2021-03-09 ENCOUNTER — Telehealth (INDEPENDENT_AMBULATORY_CARE_PROVIDER_SITE_OTHER): Payer: Medicare Other | Admitting: Psychiatry

## 2021-03-09 DIAGNOSIS — Z79899 Other long term (current) drug therapy: Secondary | ICD-10-CM | POA: Diagnosis not present

## 2021-03-09 DIAGNOSIS — F3176 Bipolar disorder, in full remission, most recent episode depressed: Secondary | ICD-10-CM | POA: Diagnosis not present

## 2021-03-09 DIAGNOSIS — F09 Unspecified mental disorder due to known physiological condition: Secondary | ICD-10-CM

## 2021-03-09 NOTE — Progress Notes (Signed)
Virtual Visit via Telephone Note  I connected with [name] on 03/09/21 at 11:30 AM EDT by telephone and verified that I am speaking with the correct person using two identifiers.  Location Provider Location : Office Patient Location : Home  Participants: Patient , Provider    I discussed the limitations, risks, security and privacy concerns of performing an evaluation and management service by telephone and the availability of in person appointments. I also discussed with the patient that there may be a patient responsible charge related to this service. The patient expressed understanding and agreed to proceed.    I discussed the assessment and treatment plan with the patient. The patient was provided an opportunity to ask questions and all were answered. The patient agreed with the plan and demonstrated an understanding of the instructions.   The patient was advised to call back or seek an in-person evaluation if the symptoms worsen or if the condition fails to improve as anticipated.   BH MD OP Progress Note  03/09/2021 11:52 AM [name]  MRN:  [id number]  Chief Complaint:  Chief Complaint    Follow-up; Insomnia     HPI: Danny Mcgrath. is a 75 year old Caucasian male, divorced, lives in [place], has a history of cognitive disorder, bipolar disorder in remission was evaluated by telemedicine today.  Patient today reports he recently was seen in the emergency department for abnormal labs, chest pressure.  Patient however reports repeat labs turned out to be good and he was advised to follow-up with cardiology.  He does not have any chest pain or any other physical symptoms at this time.  Patient reports he does have short-term memory problems for example when he goes into a room he forgets about what he is supposed to get however it comes back to him soon.  It does not affect him much.  He is managing his household activities, finances and so on  without any problem.  Patient does report sleep problems recently.  Patient reports difficulty falling asleep some nights.  He also wakes up in between to urinate.  He however is able to fall back asleep.  He reports he is taking all his medication however could not clearly give all the names.  Visit Diagnosis:    ICD-10-CM   1. Bipolar disorder, in full remission, most recent episode depressed (HCC)  F31.76   2. Mild cognitive disorder  F09   3. High risk medication use  Z79.899     Past Psychiatric History: I have psychiatric history from progress note on 12/25/2017  Past Medical History:  Past Medical History:  Diagnosis Date  . Bipolar disorder (HCC)   . Chicken pox   . Glaucoma   . Heart murmur   . History of blood transfusion   . Hypertension   . Mumps   . Pelvis fracture [facility])     Past Surgical History:  Procedure Laterality Date  . ADENOIDECTOMY    . CATARACT EXTRACTION W/PHACO Right 01/13/2016   Procedure: CATARACT EXTRACTION PHACO AND INTRAOCULAR LENS PLACEMENT (IOC);  Surgeon: Lockie Mola, MD;  Location[facility];  Service: Ophthalmology;  Laterality: Right;  . CATARACT EXTRACTION W/PHACO Left 09/07/2016   Procedure: CATARACT EXTRACTION PHACO AND INTRAOCULAR LENS PLACEMENT (IOC);  Surgeon: Lockie Mola, MD;  Location: [facility];  Service: Ophthalmology;  Laterality: Left;  PT WOULD LIKE LATER APPT  . CHOLECYSTECTOMY    . EYE SURGERY    . PENILE PROSTHESIS IMPLANT    .  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 Arthritis Mother   . Hypertension Mother   . Anxiety disorder Mother   . Depression Mother   . Lung cancer Mother   . Arthritis Father   . Hypertension Father   . Diabetes Father   . Lung cancer Father    . Prostate cancer Father   . Diabetes Sister   . Leukemia Sister     Social History: Reviewed social history from progress note on 12/25/2017 Social History   Socioeconomic History  . Marital status: Single    Spouse name: Not on file  . Number of children: Not on file  . Years of education: Not on file  . Highest education level: Not on file  Occupational History  . Not on file  Tobacco Use  . Smoking status: Never Smoker  . Smokeless tobacco: Never Used  Vaping Use  . Vaping Use: Never used  Substance and Sexual Activity  . Alcohol use: No    Alcohol/week: 0.0 standard drinks  . Drug use: No  . Sexual activity: Not Currently  Other Topics Concern  . Not on file  Social History Narrative  . Not on file   Social Determinants of Health   Financial Resource Strain: Not on file  Food Insecurity: Not on file  Transportation Needs: Not on file  Physical Activity: Not on file  Stress: Not on file  Social Connections: Not on file    Allergies: No Known Allergies  Metabolic Disorder Labs: Lab Results  Component Value Date   HGBA1C 5.0 01/15/2021   MPG 96.8 01/15/2021   No results found for: PROLACTIN Lab Results  Component Value Date   CHOL 158 01/15/2021   TRIG 77 01/15/2021   HDL 45 01/15/2021   CHOLHDL 3.5 01/15/2021   VLDL 15 01/15/2021   LDLCALC 98 01/15/2021   LDLCALC 129 (H) 04/21/2020   Lab Results  Component Value Date   TSH 2.597 01/15/2021    Therapeutic Level Labs: No results found for: LITHIUM No results found for: VALPROATE No components found for:  CBMZ  Current Medications: Current Outpatient Medications  Medication Sig Dispense Refill  . amLODipine (NORVASC) 10 MG tablet Take 1 tablet (10 mg total) by mouth daily. 90 tablet 3  . ASPIRIN 81 PO Take 81 mg by mouth daily.    Marland Kitchen atorvastatin (LIPITOR) 40 MG tablet Take 1 tablet (40 mg total) by mouth daily. 90 tablet 3  . cholecalciferol (VITAMIN D) 1000 units tablet Take 1,000 Units by  mouth daily.    Marland Kitchen escitalopram (LEXAPRO) 10 MG tablet TAKE 1 AND 1/2 TABLETS BY  MOUTH DAILY 135 tablet 3  . ferrous sulfate 325 (65 FE) MG tablet TAKE 1 TABLET BY MOUTH TWICE A DAY 60 tablet 3  . lamoTRIgine (LAMICTAL) 25 MG tablet TAKE 1 TABLET BY MOUTH  TWICE DAILY 180 tablet 3  . NEOMYCIN-POLYMYXIN-HYDROCORTISONE (CORTISPORIN) 1 % SOLN OTIC solution Apply 1-2 drops to toe BID after soaking 10 mL 1  . QUEtiapine (SEROQUEL) 25 MG tablet Take 1 tablet (25 mg total) by mouth at bedtime. 90 tablet 3  . rivastigmine (EXELON) 6 MG capsule TAKE 1 CAPSULE BY MOUTH  TWICE DAILY 180 capsule 3   No current facility-administered medications for this  visit.     Musculoskeletal: Strength & Muscle Tone: UTA Gait & Station: UTA Patient leans: N/A  Psychiatric Specialty Exam: Review of Systems  Psychiatric/Behavioral: Positive for sleep disturbance.  All other systems reviewed and are negative.   There were no vitals taken for this visit.There is no height or weight on file to calculate BMI.  General Appearance: UTA  Eye Contact:  UTA  Speech:  Clear and Coherent  Volume:  Normal  Mood:  Euthymic  Affect:  UTA  Thought Process:  Goal Directed and Descriptions of Associations: Intact  Orientation:  Full (Time, Place, and Person)  Thought Content: Logical   Suicidal Thoughts:  No  Homicidal Thoughts:  No  Memory:  Immediate;   Fair Recent;   Fair Remote;   Limited  Judgement:  Fair  Insight:  Fair  Psychomotor Activity:  UTA  Concentration:  Concentration: Fair and Attention Span: Fair  Recall:  [employer] of Knowledge: Fair  Language: Fair  Akathisia:  No  Handed:  Right  AIMS (if indicated): UTA  Assets:  Communication Skills Desire for Improvement Housing  ADL's:  Intact  Cognition: WNL  Sleep:  Restless   Screenings: Mini-Mental   Flowsheet Row Office Visit from 11/18/2016 in [facility] [place]  Total Score (max 30 points ) 30    PHQ2-9   Flowsheet Row Video  Visit from 03/09/2021 in [facility] Video Visit from 12/10/2020 in [facility] Clinical Support from 04/03/2020 in [facility] Office Visit from 10/21/2019 in [facility] [place] Clinical Support from 03/21/2019 in [facility] [place]  PHQ-2 Total Score 0 0 0 0 0  PHQ-9 Total Score -- -- 0 -- --    Flowsheet Row Video Visit from 03/09/2021 in [facility] ED from 01/22/2021 in [facility] Video Visit from 12/10/2020 in [facility]  C-SSRS RISK CATEGORY Low Risk No Risk No Risk       Assessment and Plan: Danny Mcgrath. is a 75 year old Caucasian male, lives in [place], has a history of cognitive disorder, bipolar disorder was evaluated by telemedicine today.  Patient does report sleep problems although improving, discussed plan as noted below.  Plan Bipolar disorder in remission Continue Seroquel 25 mg p.o. nightly-reduced dosage.  Patient reports sleep is improving.  If he continues to have sleep problems will consider increasing Seroquel to 50 mg.  This was discussed with patient. Lexapro 15 mg p.o. daily. Lamotrigine 25 mg p.o. twice daily  Cognitive disorder,mild,multifactorial-stable Exelon 6 mg p.o. twice daily Patient was referred to neurologist in the past however has been noncompliant.  High risk medication use-reviewed labs dated 01/15/2021-TSH-within normal limits, hemoglobin A1c-within normal limits. Prolactin, vitamin B12-pending.  I have reviewed notes per ED visit-dated 01/22/2021-Dr. Vicente Males -patient had negative work-up, patient was referred to the cardiologist, Dr. Lady Gary for outpatient cardiac risk stratification.  Patient to continue CBT as needed.  He was following up with Felecia Jan.  Follow-up in clinic in office in July.   I have spent at least 20 minutes non face to  face with patient today .  This note was generated in part or whole with voice recognition software. Voice recognition is usually quite accurate but there are transcription errors that can and very often do occur. I apologize for any typographical errors that were not detected and corrected.         Jomarie Longs, MD 03/10/2021,  8:48 AM

## 2021-04-02 ENCOUNTER — Ambulatory Visit: Payer: Medicare Other | Admitting: Cardiology

## 2021-04-06 ENCOUNTER — Ambulatory Visit: Payer: Medicare Other

## 2021-04-06 ENCOUNTER — Telehealth: Payer: Self-pay

## 2021-04-06 NOTE — Telephone Encounter (Signed)
Unable to reach or leave a message for patient scheduled for awv. No answer and mailbox full. No show in office visit. Reschedule as appropriate.

## 2021-04-08 ENCOUNTER — Ambulatory Visit (INDEPENDENT_AMBULATORY_CARE_PROVIDER_SITE_OTHER): Payer: Medicare Other

## 2021-04-08 VITALS — Ht 70.0 in | Wt 190.0 lb

## 2021-04-08 DIAGNOSIS — Z0000 Encounter for general adult medical examination without abnormal findings: Secondary | ICD-10-CM

## 2021-04-08 NOTE — Patient Instructions (Addendum)
[name] , Thank you for taking time to come for your Medicare Wellness Visit. I appreciate your ongoing commitment to your health goals. Please review the following plan we discussed and let me know if I can assist you in the future.   These are the goals we discussed:  Goals      Follow up with Primary Care Provider     As needed         This is a list of the screening recommended for you and due dates:  Health Maintenance  Topic Date Due   COVID-19 Vaccine (4 - Booster for Pfizer series) 04/24/2021*   Zoster (Shingles) Vaccine (1 of 2) 07/09/2021*   Flu Shot  05/17/2021   Tetanus Vaccine  11/18/2025   Pneumonia vaccines  Completed   Hepatitis C Screening: USPSTF Recommendation to screen - Ages 18-79 yo.  Addressed   HPV Vaccine  Aged Out  *Topic was postponed. The date shown is not the original due date.    Advanced directives: not yet completed  Conditions/risks identified: none new  Next appointment: Follow up in one year for your annual wellness visit.   Preventive Care 77 Years and Older, Male Preventive care refers to lifestyle choices and visits with your health care provider that can promote health and wellness. What does preventive care include? A yearly physical exam. This is also called an annual well check. Dental exams once or twice a year. Routine eye exams. Ask your health care provider how often you should have your eyes checked. Personal lifestyle choices, including: Daily care of your teeth and gums. Regular physical activity. Eating a healthy diet. Avoiding tobacco and drug use. Limiting alcohol use. Practicing safe sex. Taking low doses of aspirin every day. Taking vitamin and mineral supplements as recommended by your health care provider. What happens during an annual well check? The services and screenings done by your health care provider during your annual well check will depend on your age, overall health, lifestyle risk factors, and  family history of disease. Counseling  Your health care provider may ask you questions about your: Alcohol use. Tobacco use. Drug use. Emotional well-being. Home and relationship well-being. Sexual activity. Eating habits. History of falls. Memory and ability to understand (cognition). Work and work Astronomer. Screening  You may have the following tests or measurements: Height, weight, and BMI. Blood pressure. Lipid and cholesterol levels. These may be checked every 5 years, or more frequently if you are over 7 years old. Skin check. Lung cancer screening. You may have this screening every year starting at age 26 if you have a 30-pack-year history of smoking and currently smoke or have quit within the past 15 years. Fecal occult blood test (FOBT) of the stool. You may have this test every year starting at age 63. Flexible sigmoidoscopy or colonoscopy. You may have a sigmoidoscopy every 5 years or a colonoscopy every 10 years starting at age 48. Prostate cancer screening. Recommendations will vary depending on your family history and other risks. Hepatitis C blood test. Hepatitis B blood test. Sexually transmitted disease (STD) testing. Diabetes screening. This is done by checking your blood sugar (glucose) after you have not eaten for a while (fasting). You may have this done every 1-3 years. Abdominal aortic aneurysm (AAA) screening. You may need this if you are a current or former smoker. Osteoporosis. You may be screened starting at age 14 if you are at high risk. Talk with your health care provider about your  test results, treatment options, and if necessary, the need for more tests. Vaccines  Your health care provider may recommend certain vaccines, such as: Influenza vaccine. This is recommended every year. Tetanus, diphtheria, and acellular pertussis (Tdap, Td) vaccine. You may need a Td booster every 10 years. Zoster vaccine. You may need this after age 34. Pneumococcal  13-valent conjugate (PCV13) vaccine. One dose is recommended after age 37. Pneumococcal polysaccharide (PPSV23) vaccine. One dose is recommended after age 10. Talk to your health care provider about which screenings and vaccines you need and how often you need them. This information is not intended to replace advice given to you by your health care provider. Make sure you discuss any questions you have with your health care provider. Document Released: 10/30/2015 Document Revised: 06/22/2016 Document Reviewed: 08/04/2015 Elsevier Interactive Patient Education  2017 [employer] Prevention in the Home Falls can cause injuries. They can happen to people of all ages. There are many things you can do to make your home safe and to help prevent falls. What can I do on the outside of my home? Regularly fix the edges of walkways and driveways and fix any cracks. Remove anything that might make you trip as you walk through a door, such as a raised step or threshold. Trim any bushes or trees on the path to your home. Use bright outdoor lighting. Clear any walking paths of anything that might make someone trip, such as rocks or tools. Regularly check to see if handrails are loose or broken. Make sure that both sides of any steps have handrails. Any raised decks and porches should have guardrails on the edges. Have any leaves, snow, or ice cleared regularly. Use sand or salt on walking paths during winter. Clean up any spills in your garage right away. This includes oil or grease spills. What can I do in the bathroom? Use night lights. Install grab bars by the toilet and in the tub and shower. Do not use towel bars as grab bars. Use non-skid mats or decals in the tub or shower. If you need to sit down in the shower, use a plastic, non-slip stool. Keep the floor dry. Clean up any water that spills on the floor as soon as it happens. Remove soap buildup in the tub or shower regularly. Attach  bath mats securely with double-sided non-slip rug tape. Do not have throw rugs and other things on the floor that can make you trip. What can I do in the bedroom? Use night lights. Make sure that you have a light by your bed that is easy to reach. Do not use any sheets or blankets that are too big for your bed. They should not hang down onto the floor. Have a firm chair that has side arms. You can use this for support while you get dressed. Do not have throw rugs and other things on the floor that can make you trip. What can I do in the kitchen? Clean up any spills right away. Avoid walking on wet floors. Keep items that you use a lot in easy-to-reach places. If you need to reach something above you, use a strong step stool that has a grab bar. Keep electrical cords out of the way. Do not use floor polish or wax that makes floors slippery. If you must use wax, use non-skid floor wax. Do not have throw rugs and other things on the floor that can make you trip. What can I do with my  stairs? Do not leave any items on the stairs. Make sure that there are handrails on both sides of the stairs and use them. Fix handrails that are broken or loose. Make sure that handrails are as long as the stairways. Check any carpeting to make sure that it is firmly attached to the stairs. Fix any carpet that is loose or worn. Avoid having throw rugs at the top or bottom of the stairs. If you do have throw rugs, attach them to the floor with carpet tape. Make sure that you have a light switch at the top of the stairs and the bottom of the stairs. If you do not have them, ask someone to add them for you. What else can I do to help prevent falls? Wear shoes that: Do not have high heels. Have rubber bottoms. Are comfortable and fit you well. Are closed at the toe. Do not wear sandals. If you use a stepladder: Make sure that it is fully opened. Do not climb a closed stepladder. Make sure that both sides of the  stepladder are locked into place. Ask someone to hold it for you, if possible. Clearly mark and make sure that you can see: Any grab bars or handrails. First and last steps. Where the edge of each step is. Use tools that help you move around (mobility aids) if they are needed. These include: Canes. Walkers. Scooters. Crutches. Turn on the lights when you go into a dark area. Replace any light bulbs as soon as they burn out. Set up your furniture so you have a clear path. Avoid moving your furniture around. If any of your floors are uneven, fix them. If there are any pets around you, be aware of where they are. Review your medicines with your doctor. Some medicines can make you feel dizzy. This can increase your chance of falling. Ask your doctor what other things that you can do to help prevent falls. This information is not intended to replace advice given to you by your health care provider. Make sure you discuss any questions you have with your health care provider. Document Released: 07/30/2009 Document Revised: 03/10/2016 Document Reviewed: 11/07/2014 Elsevier Interactive Patient Education  2017 [employer].

## 2021-04-08 NOTE — Progress Notes (Signed)
Subjective:   Danny Rhett. is a 75 y.o. male who presents for Medicare Annual/Subsequent preventive examination.  Review of Systems    No ROS.  Medicare Wellness Virtual Visit.  Visual/audio telehealth visit, UTA vital signs.   See social history for additional risk factors.   Cardiac Risk Factors include: advanced age (>57men, >90 women);male gender     Objective:    Today's Vitals   04/08/21 1412  Weight: 190 lb (86.2 kg)  Height: 5\' 10"  (1.778 m)   Body mass index is 27.26 kg/m.  Advanced Directives 04/08/2021 01/22/2021 04/03/2020 03/21/2019 11/21/2017 11/28/2016 11/21/2016  Does Patient Have a Medical Advance Directive? No No No No No No No  Type of Advance Directive - - - - - - -  Copy of Healthcare Power of Attorney in Chart? - - - - - - -  Would patient like information on creating a medical advance directive? No - Patient declined - No - Patient declined Yes (MAU/Ambulatory/Procedural Areas - Information given) No - Patient declined - -  Some encounter information is confidential and restricted. Go to Review Flowsheets activity to see all data.    Current Medications (verified) Outpatient Encounter Medications as of 04/08/2021  Medication Sig   amLODipine (NORVASC) 10 MG tablet Take 1 tablet (10 mg total) by mouth daily.   ASPIRIN 81 PO Take 81 mg by mouth daily.   atorvastatin (LIPITOR) 40 MG tablet Take 1 tablet (40 mg total) by mouth daily.   cholecalciferol (VITAMIN D) 1000 units tablet Take 1,000 Units by mouth daily.   escitalopram (LEXAPRO) 10 MG tablet TAKE 1 AND 1/2 TABLETS BY  MOUTH DAILY   ferrous sulfate 325 (65 FE) MG tablet TAKE 1 TABLET BY MOUTH TWICE A DAY   lamoTRIgine (LAMICTAL) 25 MG tablet TAKE 1 TABLET BY MOUTH  TWICE DAILY   NEOMYCIN-POLYMYXIN-HYDROCORTISONE (CORTISPORIN) 1 % SOLN OTIC solution Apply 1-2 drops to toe BID after soaking   QUEtiapine (SEROQUEL) 25 MG tablet Take 1 tablet (25 mg total) by mouth at bedtime.   rivastigmine (EXELON) 6  MG capsule TAKE 1 CAPSULE BY MOUTH  TWICE DAILY   No facility-administered encounter medications on file as of 04/08/2021.    Allergies (verified) Patient has no known allergies.   History: Past Medical History:  Diagnosis Date   Bipolar disorder (HCC)    Chicken pox    Glaucoma    Heart murmur    History of blood transfusion    Hypertension    Mumps    Pelvis fracture [facility])    Past Surgical History:  Procedure Laterality Date   ADENOIDECTOMY     CATARACT EXTRACTION W/PHACO Right 01/13/2016   Procedure: CATARACT EXTRACTION PHACO AND INTRAOCULAR LENS PLACEMENT (IOC);  Surgeon: 01/15/2016, MD;  Location: [facility];  Service: Ophthalmology;  Laterality: Right;   CATARACT EXTRACTION W/PHACO Left 09/07/2016   Procedure: CATARACT EXTRACTION PHACO AND INTRAOCULAR LENS PLACEMENT (IOC);  Surgeon: 09/09/2016, MD;  Location: [facility];  Service: Ophthalmology;  Laterality: Left;  PT WOULD LIKE LATER APPT   CHOLECYSTECTOMY     EYE SURGERY     PENILE PROSTHESIS IMPLANT     SURGERY SCROTAL / TESTICULAR     Unclear history; 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   Family History  Problem Relation Age of Onset   Arthritis Mother    Hypertension Mother    Anxiety disorder Mother    Depression Mother    Lung cancer Mother  Arthritis Father    Hypertension Father    Diabetes Father    Lung cancer Father    Prostate cancer Father    Diabetes Sister    Leukemia Sister    Social History   Socioeconomic History   Marital status: Single    Spouse name: Not on file   Number of children: Not on file   Years of education: Not on file   Highest education level: Not on file  Occupational History   Not on file  Tobacco Use   Smoking status: Never   Smokeless tobacco: Never  Vaping Use   Vaping Use: Never used  Substance and Sexual Activity   Alcohol use: No    Alcohol/week: 0.0 standard drinks   Drug use: No   Sexual activity: Not Currently   Other Topics Concern   Not on file  Social History Narrative   Not on file   Social Determinants of Health   Financial Resource Strain: Low Risk    Difficulty of Paying Living Expenses: Not hard at all  Food Insecurity: No Food Insecurity   Worried About Programme researcher, broadcasting/film/video in the Last Year: Never true   Ran Out of Food in the Last Year: Never true  Transportation Needs: No Transportation Needs   Lack of Transportation (Medical): No   Lack of Transportation (Non-Medical): No  Physical Activity: Insufficiently Active   Days of Exercise per Week: 2 days   Minutes of Exercise per Session: 20 min  Stress: No Stress Concern Present   Feeling of Stress : Not at all  Social Connections: Unknown   Frequency of Communication with Friends and Family: More than three times a week   Frequency of Social Gatherings with Friends and Family: Not on file   Attends Religious Services: Not on Scientist, clinical (histocompatibility and immunogenetics) or Organizations: Not on file   Attends Banker Meetings: Not on file   Marital Status: Not on file    Tobacco Counseling Counseling given: Not Answered   Clinical Intake:  Pre-visit preparation completed: Yes        Diabetes: No  How often do you need to have someone help you when you read instructions, pamphlets, or other written materials from your doctor or pharmacy?: 1 - Never    Interpreter Needed?: No      Activities of Daily Living In your present state of health, do you have any difficulty performing the following activities: 04/08/2021  Hearing? N  Vision? N  Difficulty concentrating or making decisions? Y  Walking or climbing stairs? Y  Dressing or bathing? N  Doing errands, shopping? N  Preparing Food and eating ? N  Using the Toilet? N  In the past six months, have you accidently leaked urine? N  Do you have problems with loss of bowel control? N  Managing your Medications? N  Managing your Finances? N  Housekeeping or  managing your Housekeeping? N  Some recent data might be hidden    Patient Care Team: Glori Luis, MD as PCP - General (Family Medicine)  Indicate any recent Medical Services you may have received from other than [facility] providers in the past year (date may be approximate).     Assessment:   This is a routine wellness examination for Wallis.  I connected with Michah today by telephone and verified that I am speaking with the correct person using two identifiers. Location patient: home Location provider: work Persons participating in the  virtual visit: patient, nurse.    I discussed the limitations, risks, security and privacy concerns of performing an evaluation and management service by telephone and the availability of in person appointments. The patient expressed understanding and verbally consented to this telephonic visit.    Interactive audio and video telecommunications were attempted between this provider and patient, however failed, due to patient having technical difficulties OR patient did not have access to video capability.  We continued and completed visit with audio only.  Some vital signs may be absent or patient reported.   Hearing/Vision screen Hearing Screening - Comments:: Patient is able to hear conversational tones without difficulty.  No issues reported. Vision Screening - Comments:: Wears corrective lenses  Dietary issues and exercise activities discussed: Current Exercise Habits: Home exercise routine, Type of exercise: walking, Time (Minutes): 20, Frequency (Times/Week): 2, Weekly Exercise (Minutes/Week): 40, Intensity: Mild Regular diet Good water intake   Goals Addressed             This Visit's Progress    Follow up with Primary Care Provider       As needed        Depression Screen [facility] Scores 04/08/2021 04/03/2020 10/21/2019 03/21/2019 11/21/2017 11/18/2016  PHQ - 2 Score 0 0 0 0 0 0  PHQ- 9 Score - 0 - - - -  Exception Documentation -  Other- indicate reason in comment box - - - -  Not completed - Followed by counseling every 2 months. Notes doing well. - - - -  Some encounter information is confidential and restricted. Go to Review Flowsheets activity to see all data.    Fall Risk Fall Risk  04/08/2021 04/03/2020 10/21/2019 10/21/2019 03/21/2019  Falls in the past year? 1 1 1 1 1   Number falls in past yr: 1 0 1 1 1   Comment - - - - Knee weakness. Followed by pcp.   Injury with Fall? 0 - 0 0 0  Risk Factor Category  - - - - -  Risk for fall due to : Impaired balance/gait - History of fall(s) - History of fall(s)  Risk for fall due to: Comment Chronic knee pain. Cane in use as needed. - - - -  Follow up - Falls evaluation completed Falls evaluation completed;Education provided;Falls prevention discussed Falls evaluation completed -    FALL RISK PREVENTION PERTAINING TO THE HOME: Handrails in use when climbing stairs? Yes Home free of loose throw rugs in walkways, pet beds, electrical cords, etc? Yes  Adequate lighting in your home to reduce risk of falls? Yes   ASSISTIVE DEVICES UTILIZED TO PREVENT FALLS: Life alert? No  Use of a cane, walker or w/c? Yes , cane as needed.  TIMED UP AND GO: Was the test performed? No . Virtual visit.   Cognitive Function: MMSE - Mini Mental State Exam 11/18/2016  Orientation to time 5  Orientation to Place 5  Registration 3  Attention/ Calculation 5  Recall 3  Language- name 2 objects 2  Language- repeat 1  Language- follow 3 step command 3  Language- read & follow direction 1  Write a sentence 1  Copy design 1  Total score 30     6CIT Screen 04/08/2021 04/03/2020 03/21/2019 11/21/2017  What Year? 0 points 0 points 0 points 0 points  What month? 0 points 0 points 0 points 0 points  What time? 0 points - 0 points 0 points  Count back from 20 0 points - 0 points 0  points  Months in reverse 0 points 0 points 0 points 0 points  Repeat phrase 0 points 4 points - 0 points  Total Score 0  - - 0    Immunizations Immunization History  Administered Date(s) Administered   Fluad Quad(high Dose 65+) 07/17/2019, 12/15/2020   Influenza, High Dose Seasonal PF 11/18/2016, 08/04/2017   Influenza,inj,Quad PF,6+ Mos 07/23/2015   PFIZER(Purple Top)SARS-COV-2 Vaccination 12/18/2019, 02/08/2020, 07/16/2020   Pneumococcal Conjugate-13 11/19/2015   Pneumococcal Polysaccharide-23 11/21/2017   Tdap 11/19/2015   Health Maintenance Health Maintenance  Topic Date Due   COVID-19 Vaccine (4 - Booster for Pfizer series) 04/24/2021 (Originally 10/15/2020)   Zoster Vaccines- Shingrix (1 of 2) 07/09/2021 (Originally 06/14/1965)   INFLUENZA VACCINE  05/17/2021   TETANUS/TDAP  11/18/2025   PNA vac Low Risk Adult  Completed   Hepatitis C Screening  Addressed   HPV VACCINES  Aged Out    Colorectal cancer screening: Type of screening: Cologuard. Completed 12/28/19. Repeat every 3 years  Dental Screening: Recommended annual dental exams for proper oral hygiene  Community Resource Referral / Chronic Care Management: CRR required this visit?  No   CCM required this visit?  No      Plan:     I have personally reviewed and noted the following in the patient's chart:   Medical and social history Use of alcohol, tobacco or illicit drugs  Current medications and supplements including opioid prescriptions. Patient is not currently taking opioid prescriptions. Functional ability and status Nutritional status Physical activity Advanced directives List of other physicians Hospitalizations, surgeries, and ER visits in previous 12 months Vitals Screenings to include cognitive, depression, and falls Referrals and appointments  In addition, I have reviewed and discussed with patient certain preventive protocols, quality metrics, and best practice recommendations. A written personalized care plan for preventive services as well as general preventive health recommendations were provided to patient  via mail.     Ashok Pall, LPN   1/74/0814

## 2021-04-16 ENCOUNTER — Ambulatory Visit: Payer: Medicare Other | Admitting: Family Medicine

## 2021-04-20 ENCOUNTER — Ambulatory Visit: Payer: Medicare Other | Admitting: Gastroenterology

## 2021-04-20 ENCOUNTER — Encounter: Payer: Self-pay | Admitting: *Deleted

## 2021-04-20 NOTE — Progress Notes (Deleted)
Primary Care Physician: Glori Luis, MD  Primary Gastroenterologist:  Dr. Midge Minium  No chief complaint on file.   HPI: Danny Belger. is a 75 y.o. male here with a history of see me in the past for increased alkaline phosphatase.  The patient alkaline phosphatase 2 months ago was normal.  The patient was now sent back to me due to iron deficiency anemia.  The patient's hemoglobin hematocrit have shown:  Component     Latest Ref Rng & Units 04/21/2020 01/15/2021 01/22/2021  Hemoglobin     13.0 - 17.0 g/dL 15.1 76.1 60.7  HCT     39.0 - 52.0 % 43.4 40.0 40.4  MCV     80.0 - 100.0 fL 94.5 94.3 94.0   The patient also had previous iron studies done sometime ago that showed:  Component     Latest Ref Rng & Units 04/11/2019 04/21/2020  Ferritin     22.0 - 322.0 ng/mL 27.4 26.0  Iron     42 - 165 ug/dL 93 371  TIBC     062 - 425 mcg/dL (calc)    %SAT     20 - 48 % (calc)    Transferrin     212.0 - 360.0 mg/dL 694.8 546.2  Saturation Ratios     20.0 - 50.0 % 22.7 26.1   Back in April patient was seen by his primary care physician with atypical chest pain and GERD was suggested as a possible cause of his chest pain.  The patient was also sent for evaluation by cardiology  Past Medical History:  Diagnosis Date   Bipolar disorder (HCC)    Chicken pox    Glaucoma    Heart murmur    History of blood transfusion    Hypertension    Mumps    Pelvis fracture (HCC)     Current Outpatient Medications  Medication Sig Dispense Refill   amLODipine (NORVASC) 10 MG tablet Take 1 tablet (10 mg total) by mouth daily. 90 tablet 3   ASPIRIN 81 PO Take 81 mg by mouth daily.     atorvastatin (LIPITOR) 40 MG tablet Take 1 tablet (40 mg total) by mouth daily. 90 tablet 3   cholecalciferol (VITAMIN D) 1000 units tablet Take 1,000 Units by mouth daily.     escitalopram (LEXAPRO) 10 MG tablet TAKE 1 AND 1/2 TABLETS BY  MOUTH DAILY 135 tablet 3   ferrous sulfate 325 (65 FE) MG tablet  TAKE 1 TABLET BY MOUTH TWICE A DAY 60 tablet 3   lamoTRIgine (LAMICTAL) 25 MG tablet TAKE 1 TABLET BY MOUTH  TWICE DAILY 180 tablet 3   NEOMYCIN-POLYMYXIN-HYDROCORTISONE (CORTISPORIN) 1 % SOLN OTIC solution Apply 1-2 drops to toe BID after soaking 10 mL 1   QUEtiapine (SEROQUEL) 25 MG tablet Take 1 tablet (25 mg total) by mouth at bedtime. 90 tablet 3   rivastigmine (EXELON) 6 MG capsule TAKE 1 CAPSULE BY MOUTH  TWICE DAILY 180 capsule 3   No current facility-administered medications for this visit.    Allergies as of 04/20/2021   (No Known Allergies)    ROS:  General: Negative for anorexia, weight loss, fever, chills, fatigue, weakness. ENT: Negative for hoarseness, difficulty swallowing , nasal congestion. CV: Negative for chest pain, angina, palpitations, dyspnea on exertion, peripheral edema.  Respiratory: Negative for dyspnea at rest, dyspnea on exertion, cough, sputum, wheezing.  GI: See history of present illness. GU:  Negative for dysuria, hematuria, urinary incontinence, urinary  frequency, nocturnal urination.  Endo: Negative for unusual weight change.    Physical Examination:   There were no vitals taken for this visit.  General: Well-nourished, well-developed in no acute distress.  Eyes: No icterus. Conjunctivae pink. Lungs: Clear to auscultation bilaterally. Non-labored. Heart: Regular rate and rhythm, no murmurs rubs or gallops.  Abdomen: Bowel sounds are normal, nontender, nondistended, no hepatosplenomegaly or masses, no abdominal bruits or hernia , no rebound or guarding.   Extremities: No lower extremity edema. No clubbing or deformities. Neuro: Alert and oriented x 3.  Grossly intact. Skin: Warm and dry, no jaundice.   Psych: Alert and cooperative, normal mood and affect.  Labs:    Imaging Studies: No results found.  Assessment and Plan:   Danny Torbeck. is a 75 y.o. y/o male ***     Midge Minium, MD. Clementeen Graham    Note: This dictation was prepared  with Dragon dictation along with smaller phrase technology. Any transcriptional errors that result from this process are unintentional.

## 2021-05-11 ENCOUNTER — Other Ambulatory Visit: Payer: Self-pay

## 2021-05-11 ENCOUNTER — Encounter: Payer: Self-pay | Admitting: Psychiatry

## 2021-05-11 ENCOUNTER — Telehealth (INDEPENDENT_AMBULATORY_CARE_PROVIDER_SITE_OTHER): Payer: Medicare Other | Admitting: Psychiatry

## 2021-05-11 DIAGNOSIS — F09 Unspecified mental disorder due to known physiological condition: Secondary | ICD-10-CM

## 2021-05-11 DIAGNOSIS — F3176 Bipolar disorder, in full remission, most recent episode depressed: Secondary | ICD-10-CM

## 2021-05-11 NOTE — Progress Notes (Signed)
Virtual Visit via Telephone Note  I connected with Danny Mcgrath. on 05/11/21 at  2:00 PM EDT by telephone and verified that I am speaking with the correct person using two identifiers.  Location Provider Location : [facility] Patient Location : Home  Participants: Patient , Provider   I discussed the limitations, risks, security and privacy concerns of performing an evaluation and management service by telephone and the availability of in person appointments. I also discussed with the patient that there may be a patient responsible charge related to this service. The patient expressed understanding and agreed to proceed.    I discussed the assessment and treatment plan with the patient. The patient was provided an opportunity to ask questions and all were answered. The patient agreed with the plan and demonstrated an understanding of the instructions.   The patient was advised to call back or seek an in-person evaluation if the symptoms worsen or if the condition fails to improve as anticipated.   BH MD OP Progress Note  05/11/2021 2:15 PM Danny Mcgrath.  MRN:  [id number]  Chief Complaint:  Chief Complaint   Follow-up; Anxiety    HPI: Danny Mcgrath. Is a 75 year old Caucasian male, divorced, lives in [place], has a history of bipolar disorder in remission, cognitive disorder was evaluated by telemedicine today.  Patient was supposed to come into the office for an office visit however changed it into a phone call.  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  He hence decided not to come into the office for an office visit.  He reports he is currently doing great.  Denies any significant mood swings.  Reports sleep is good.  He is compliant on medications.  Denies side effects.  He reports he does have short-term memory problems however he is able to cope with it by setting reminders and checklists and keeping  track of things.  He appeared to be alert, oriented to person place time and situation and was able to answer questions appropriately.  Patient denies any suicidality, homicidality.  Patient did not appear to be preoccupied with any delusions or paranoia.  Patient denies any other concerns today.  Visit Diagnosis:    ICD-10-CM   1. Bipolar disorder, in full remission, most recent episode depressed (HCC)  F31.76     2. Mild cognitive disorder  F09       Past Psychiatric History: Reviewed past psychiatric history from progress note on 12/25/2017  Past Medical History:  Past Medical History:  Diagnosis Date   Bipolar disorder (HCC)    Chicken pox    Glaucoma    Heart murmur    History of blood transfusion    Hypertension    Mumps    Pelvis fracture [facility])     Past Surgical History:  Procedure Laterality Date   ADENOIDECTOMY     CATARACT EXTRACTION W/PHACO Right 01/13/2016   Procedure: CATARACT EXTRACTION PHACO AND INTRAOCULAR LENS PLACEMENT (IOC);  Surgeon: Lockie Mola, MD;  Location: [facility];  Service: Ophthalmology;  Laterality: Right;   CATARACT EXTRACTION W/PHACO Left 09/07/2016   Procedure: CATARACT EXTRACTION PHACO AND INTRAOCULAR LENS PLACEMENT (IOC);  Surgeon: Lockie Mola, MD;  Location: [facility];  Service: Ophthalmology;  Laterality: Left;  PT WOULD LIKE LATER APPT   CHOLECYSTECTOMY     EYE SURGERY     PENILE PROSTHESIS IMPLANT     SURGERY SCROTAL / TESTICULAR  Unclear history; 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    Family Psychiatric History: Reviewed family psychiatric history from progress note on 12/25/2017  Family History:  Family History  Problem Relation Age of Onset   Arthritis Mother    Hypertension Mother    Anxiety disorder Mother    Depression Mother    Lung cancer Mother    Arthritis Father    Hypertension Father    Diabetes Father    Lung cancer Father    Prostate cancer Father    Diabetes Sister     Leukemia Sister     Social History: Reviewed social history from progress note on 12/25/2017 Social History   Socioeconomic History   Marital status: Single    Spouse name: Not on file   Number of children: Not on file   Years of education: Not on file   Highest education level: Not on file  Occupational History   Not on file  Tobacco Use   Smoking status: Never   Smokeless tobacco: Never  Vaping Use   Vaping Use: Never used  Substance and Sexual Activity   Alcohol use: No    Alcohol/week: 0.0 standard drinks   Drug use: No   Sexual activity: Not Currently  Other Topics Concern   Not on file  Social History Narrative   Not on file   Social Determinants of Health   Financial Resource Strain: Low Risk    Difficulty of Paying Living Expenses: Not hard at all  Food Insecurity: No Food Insecurity   Worried About Programme researcher, broadcasting/film/video in the Last Year: Never true   Ran Out of Food in the Last Year: Never true  Transportation Needs: No Transportation Needs   Lack of Transportation (Medical): No   Lack of Transportation (Non-Medical): No  Physical Activity: Insufficiently Active   Days of Exercise per Week: 2 days   Minutes of Exercise per Session: 20 min  Stress: No Stress Concern Present   Feeling of Stress : Not at all  Social Connections: Unknown   Frequency of Communication with Friends and Family: More than three times a week   Frequency of Social Gatherings with Friends and Family: Not on file   Attends Religious Services: Not on file   Active Member of Clubs or Organizations: Not on file   Attends Banker Meetings: Not on file   Marital Status: Not on file    Allergies: No Known Allergies  Metabolic Disorder Labs: Lab Results  Component Value Date   HGBA1C 5.0 01/15/2021   MPG 96.8 01/15/2021   No results found for: PROLACTIN Lab Results  Component Value Date   CHOL 158 01/15/2021   TRIG 77 01/15/2021   HDL 45 01/15/2021   CHOLHDL 3.5  01/15/2021   VLDL 15 01/15/2021   LDLCALC 98 01/15/2021   LDLCALC 129 (H) 04/21/2020   Lab Results  Component Value Date   TSH 2.597 01/15/2021    Therapeutic Level Labs: No results found for: LITHIUM No results found for: VALPROATE No components found for:  CBMZ  Current Medications: Current Outpatient Medications  Medication Sig Dispense Refill   amLODipine (NORVASC) 10 MG tablet Take 1 tablet (10 mg total) by mouth daily. 90 tablet 3   ASPIRIN 81 PO Take 81 mg by mouth daily.     atorvastatin (LIPITOR) 40 MG tablet Take 1 tablet (40 mg total) by mouth daily. 90 tablet 3   cholecalciferol (VITAMIN D) 1000 units tablet Take 1,000 Units  by mouth daily.     escitalopram (LEXAPRO) 10 MG tablet TAKE 1 AND 1/2 TABLETS BY  MOUTH DAILY 135 tablet 3   ferrous sulfate 325 (65 FE) MG tablet TAKE 1 TABLET BY MOUTH TWICE A DAY 60 tablet 3   lamoTRIgine (LAMICTAL) 25 MG tablet TAKE 1 TABLET BY MOUTH  TWICE DAILY 180 tablet 3   NEOMYCIN-POLYMYXIN-HYDROCORTISONE (CORTISPORIN) 1 % SOLN OTIC solution Apply 1-2 drops to toe BID after soaking 10 mL 1   QUEtiapine (SEROQUEL) 25 MG tablet Take 1 tablet (25 mg total) by mouth at bedtime. 90 tablet 3   rivastigmine (EXELON) 6 MG capsule TAKE 1 CAPSULE BY MOUTH  TWICE DAILY 180 capsule 3   No current facility-administered medications for this visit.     Musculoskeletal: Strength & Muscle Tone:  UTA Gait & Station:  UTA Patient leans: N/A  Psychiatric Specialty Exam: Review of Systems  Psychiatric/Behavioral:  Negative for agitation, behavioral problems, confusion, decreased concentration, dysphoric mood, hallucinations, self-injury, sleep disturbance and suicidal ideas. The patient is not nervous/anxious and is not hyperactive.   All other systems reviewed and are negative.  There were no vitals taken for this visit.There is no height or weight on file to calculate BMI.  General Appearance: UTA  Eye Contact:  UTA  Speech:  Clear and Coherent   Volume:  Normal  Mood:  Euthymic  Affect:   UTA  Thought Process:  Goal Directed and Descriptions of Associations: Intact  Orientation:  Full (Time, Place, and Person)  Thought Content: Logical   Suicidal Thoughts:  No  Homicidal Thoughts:  No  Memory:  Immediate;   Fair Recent;   Fair Remote;   Limited  Judgement:  Fair  Insight:  Fair  Psychomotor Activity:   UTA  Concentration:  Concentration: Fair and Attention Span: Fair  Recall:  [employer] of Knowledge: Fair  Language: Fair  Akathisia:  No  Handed:  Right  AIMS (if indicated): not done  Assets:  Communication Skills Desire for Improvement Housing  ADL's:  Intact  Cognition: Baseline  Sleep:  Fair   Screenings: Mini-Mental    Flowsheet Row Office Visit from 11/18/2016 in [facility]  Total Score (max 30 points ) 30      PHQ2-9    Flowsheet Row Clinical Support from 04/08/2021 in [facility] Video Visit from 03/09/2021 in [facility] Video Visit from 12/10/2020 in [facility] Clinical Support from 04/03/2020 in [facility] Office Visit from 10/21/2019 in [facility]  PHQ-2 Total Score 0 0 0 0 0  PHQ-9 Total Score -- -- -- 0 --      Flowsheet Row Video Visit from 03/09/2021 in [facility] ED from 01/22/2021 in [facility] Video Visit from 12/10/2020 in [facility]  C-SSRS RISK CATEGORY Low Risk No Risk No Risk        Assessment and Plan: Danny Mcgrath. Is a 75 year old Caucasian male, lives in [place], has a history of cognitive disorder, bipolar disorder was evaluated by telemedicine today.  Patient is currently stable.  Plan as noted below.  Plan Bipolar disorder in remission Seroquel 25 mg p.o. nightly at reduced dosage. Lexapro 15 mg p.o. daily Lamotrigine 25 mg p.o.  twice daily  Cognitive disorder mild, multifactorial-stable Exelon 6 mg p.o. twice daily Patient was referred to neurologist in the past.  Follow-up in clinic in office in  2 months or sooner if needed.   I have spent at least 15 minutes non face to face with patient today .  This note was generated in part or whole with voice recognition software. Voice recognition is usually quite accurate but there are transcription errors that can and very often do occur. I apologize for any typographical errors that were not detected and corrected.        Jomarie LongsSaramma Treven Holtman, MD 05/12/2021, 9:28 AM

## 2021-05-13 ENCOUNTER — Ambulatory Visit: Payer: Medicare Other | Admitting: Family Medicine

## 2021-05-24 ENCOUNTER — Encounter: Payer: Self-pay | Admitting: Family Medicine

## 2021-05-24 ENCOUNTER — Other Ambulatory Visit: Payer: Self-pay

## 2021-05-24 ENCOUNTER — Ambulatory Visit (INDEPENDENT_AMBULATORY_CARE_PROVIDER_SITE_OTHER): Payer: Medicare Other | Admitting: Family Medicine

## 2021-05-24 DIAGNOSIS — I447 Left bundle-branch block, unspecified: Secondary | ICD-10-CM | POA: Diagnosis not present

## 2021-05-24 DIAGNOSIS — I10 Essential (primary) hypertension: Secondary | ICD-10-CM | POA: Diagnosis not present

## 2021-05-24 DIAGNOSIS — E785 Hyperlipidemia, unspecified: Secondary | ICD-10-CM | POA: Diagnosis not present

## 2021-05-24 NOTE — Assessment & Plan Note (Signed)
The patient was given the phone number for cardiology so that he will answer the phone when they call to schedule.  Referral placed.

## 2021-05-24 NOTE — Assessment & Plan Note (Signed)
Adequate control on most recent check.  He will continue Lipitor 40 mg once daily.

## 2021-05-24 NOTE — Progress Notes (Signed)
Danny Alar, MD Phone: [phone]  Danny Mcgrath. is a 75 y.o. male who presents today for f/u.  HYPERTENSION Disease Monitoring Home BP Monitoring similar to today Chest pain- no    Dyspnea- no Medications Compliance-  taking amlodipine.  Edema- no Notes he ate breakfast though does not eat again until dinnertime.  Notes is not hungry in the middle the day.  Hyperlipidemia: Taking Lipitor.  No right quadrant pain or myalgias.  Left bundle branch block: Patient was not able to see cardiology previously.   Social History   Tobacco Use  Smoking Status Never  Smokeless Tobacco Never    Current Outpatient Medications on File Prior to Visit  Medication Sig Dispense Refill   amLODipine (NORVASC) 10 MG tablet Take 1 tablet (10 mg total) by mouth daily. 90 tablet 3   ASPIRIN 81 PO Take 81 mg by mouth daily.     atorvastatin (LIPITOR) 40 MG tablet Take 1 tablet (40 mg total) by mouth daily. 90 tablet 3   cholecalciferol (VITAMIN D) 1000 units tablet Take 1,000 Units by mouth daily.     escitalopram (LEXAPRO) 10 MG tablet TAKE 1 AND 1/2 TABLETS BY  MOUTH DAILY 135 tablet 3   lamoTRIgine (LAMICTAL) 25 MG tablet TAKE 1 TABLET BY MOUTH  TWICE DAILY 180 tablet 3   QUEtiapine (SEROQUEL) 25 MG tablet Take 1 tablet (25 mg total) by mouth at bedtime. 90 tablet 3   rivastigmine (EXELON) 6 MG capsule TAKE 1 CAPSULE BY MOUTH  TWICE DAILY 180 capsule 3   No current facility-administered medications on file prior to visit.     ROS see history of present illness  Objective  Physical Exam Vitals:   05/24/21 1439  BP: 130/80  Pulse: 65  Temp: 98.6 F (37 C)  SpO2: 95%    BP Readings from Last 3 Encounters:  05/24/21 130/80  01/22/21 136/81  01/15/21 138/62   Wt Readings from Last 3 Encounters:  05/24/21 186 lb (84.4 kg)  04/08/21 190 lb (86.2 kg)  01/22/21 190 lb (86.2 kg)    Physical Exam Constitutional:      General: He is not in acute distress.    Appearance:  He is not diaphoretic.  Cardiovascular:     Rate and Rhythm: Normal rate and regular rhythm.     Heart sounds: Normal heart sounds.  Pulmonary:     Effort: Pulmonary effort is normal.     Breath sounds: Normal breath sounds.  Skin:    General: Skin is warm and dry.  Neurological:     Mental Status: He is alert.     Assessment/Plan: Please see individual problem list.  Problem List Items Addressed This Visit     HTN (hypertension)    Adequate control for age.  He will continue amlodipine 10 mg once daily.       Hyperlipidemia    Adequate control on most recent check.  He will continue Lipitor 40 mg once daily.       Left bundle branch block    The patient was given the phone number for cardiology so that he will answer the phone when they call to schedule.  Referral placed.       Relevant Orders   Ambulatory referral to Cardiology   Return in about 6 months (around 11/24/2021).  This visit occurred during the SARS-CoV-2 public health emergency.  Safety protocols were in place, including screening questions prior to the visit, additional usage of staff PPE, and extensive  cleaning of exam room while observing appropriate contact time as indicated for disinfecting solutions.    Tommi Rumps, MD [place]

## 2021-05-24 NOTE — Patient Instructions (Signed)
Nice to see you. Cardiology should contact you to schedule an appointment.  Their phone number is [phone]. Please continue to monitor your blood pressure and if it trends up please let us know.

## 2021-05-24 NOTE — Assessment & Plan Note (Signed)
Adequate control for age.  He will continue amlodipine 10 mg once daily.

## 2021-06-04 ENCOUNTER — Telehealth: Payer: Self-pay

## 2021-06-04 DIAGNOSIS — F3176 Bipolar disorder, in full remission, most recent episode depressed: Secondary | ICD-10-CM

## 2021-06-04 MED ORDER — AMLODIPINE BESYLATE 10 MG PO TABS: 10.0000 mg | ORAL_TABLET | Freq: Every day | ORAL | 3 refills | Status: AC

## 2021-06-04 MED ORDER — QUETIAPINE FUMARATE 25 MG PO TABS: 25.0000 mg | ORAL_TABLET | Freq: Every day | ORAL | 3 refills | Status: AC

## 2021-06-04 NOTE — Telephone Encounter (Signed)
Medication has been ordered and sent to [facility]

## 2021-06-04 NOTE — Telephone Encounter (Signed)
Pt needs a refill on QUEtiapine (SEROQUEL) 25 MG tablet and amLODipine (NORVASC) 10 MG tablet sent to [facility]

## 2021-07-09 ENCOUNTER — Ambulatory Visit: Payer: Medicare Other | Admitting: Cardiology

## 2021-07-20 ENCOUNTER — Ambulatory Visit: Payer: Medicare Other | Admitting: Psychiatry

## 2021-08-12 ENCOUNTER — Telehealth: Payer: Self-pay | Admitting: Family Medicine

## 2021-08-12 ENCOUNTER — Other Ambulatory Visit: Payer: Self-pay | Admitting: Psychiatry

## 2021-08-12 DIAGNOSIS — F09 Unspecified mental disorder due to known physiological condition: Secondary | ICD-10-CM

## 2021-08-12 NOTE — Telephone Encounter (Signed)
Patient dropped of a paper to be signed by Dr Birdie Sons to have his teeth pulled.

## 2021-08-13 DIAGNOSIS — Z0279 Encounter for issue of other medical certificate: Secondary | ICD-10-CM

## 2021-08-13 NOTE — Telephone Encounter (Signed)
This type of form needs to be given to [place] more that one day prior to him needing it completed. I need more details on the procedure he is having. Are they using generalized anesthesia? Or is it just local anesthesia? Is he just having his teeth pulled or are they doing anything else in his mouth during the procedure? Is he still taking the aspirin?

## 2021-08-13 NOTE — Telephone Encounter (Signed)
Patient called about form, would like to know if it is signed because he needs it for Monday.He would like a call back from the office today, [phone].

## 2021-08-13 NOTE — Telephone Encounter (Signed)
Form is completed and placed up front and patient will pick up Monday morning.  Pavneet Markwood,cma

## 2021-08-13 NOTE — Telephone Encounter (Signed)
LVM for patient to call back for questions about form.  I informed him to ask for Marshea Wisher.  Demetrica Zipp,cma

## 2021-08-13 NOTE — Telephone Encounter (Signed)
Please attach his medications and problem list to this paper and make available for him to pick up.

## 2021-08-13 NOTE — Telephone Encounter (Signed)
LVM for patient to call back.   Amos Gaber,cma  

## 2021-08-13 NOTE — Addendum Note (Signed)
Addended by: Glori Luis on: 08/13/2021 04:31 PM   Modules accepted: Orders

## 2021-08-13 NOTE — Telephone Encounter (Signed)
Patient called back and stated he is having all his teeth removed and getting denture, he is getting local anesthesia and he does not take aspirin.  Chason Mciver,cma

## 2021-08-17 ENCOUNTER — Ambulatory Visit: Payer: Medicare Other | Admitting: Psychiatry

## 2021-09-02 ENCOUNTER — Other Ambulatory Visit: Payer: Self-pay | Admitting: Psychiatry

## 2021-09-02 DIAGNOSIS — F3181 Bipolar II disorder: Secondary | ICD-10-CM

## 2021-09-21 ENCOUNTER — Telehealth: Payer: Self-pay | Admitting: Family Medicine

## 2021-09-21 NOTE — Telephone Encounter (Signed)
Patient called and is wondering why it will take 2 to 3 business days to refill. He is out of his medication.

## 2021-09-21 NOTE — Telephone Encounter (Signed)
Patient is requesting a refill on his amLODipine (NORVASC) 10 MG tablet.

## 2021-09-22 ENCOUNTER — Ambulatory Visit: Payer: Medicare Other | Admitting: Psychiatry

## 2021-09-23 NOTE — Telephone Encounter (Signed)
I called  and LVM informing the [atient that I called to [facility] mail order on 12/6  and put the refill in for the Norvasc  and I called again today and [facility] stated they shipped the medication out on 12/7 and he should be receiving it soon. Reginae Wolfrey,cma

## 2021-10-04 ENCOUNTER — Telehealth: Payer: Self-pay | Admitting: Family Medicine

## 2021-10-04 NOTE — Telephone Encounter (Signed)
Patient  called in needing appt  for today, cant sleep at night m having lot phlegm, pain in neck xfer to access nurse

## 2021-10-04 NOTE — Telephone Encounter (Signed)
I called and spoke with the patient and he stated Access nurse sent him to the ER and that's where he is going today.  Miquel Stacks,cma

## 2021-10-07 ENCOUNTER — Telehealth: Payer: Self-pay | Admitting: Internal Medicine

## 2021-10-08 NOTE — Telephone Encounter (Signed)
Noted. Can you change him to deceased in the system?

## 2021-10-14 ENCOUNTER — Ambulatory Visit: Payer: Medicare Other | Admitting: Psychiatry

## 2021-10-16 ENCOUNTER — Other Ambulatory Visit: Payer: Self-pay | Admitting: Psychiatry

## 2021-10-16 DIAGNOSIS — F3181 Bipolar II disorder: Secondary | ICD-10-CM

## 2021-10-17 NOTE — Telephone Encounter (Signed)
Received call from Officer Delgato [phone]. They received a call from Mr Schmader's neighbors that they had not seen him for a couple of days.  Officer found him lying in bed.  No foul play.  Medical examiner notified.  No autopsy.  Recorded time of death 02/01[birth year]).

## 2021-10-17 DEATH — deceased

## 2021-10-22 ENCOUNTER — Telehealth: Payer: Self-pay | Admitting: Family Medicine

## 2021-10-22 NOTE — Telephone Encounter (Signed)
Danny Mcgrath in stated that she need a signature on Death certificate in [place] [id number]. Patient expired 10/04/2021 TOD 21:45.If have any question call Danny at [phone]

## 2021-10-22 NOTE — Telephone Encounter (Signed)
Danny Mcgrath [place] in stated that she need a signature on Death certificate in [place] [id number]. Patient expired 09/22/2021 TOD 21:45.If have any question call Danny at [phone]. Dr. Derrel Nip can you sign the death certificate for Dr. Ellen Henri patient in [place].  Thank you.  Magdala Brahmbhatt,cma

## 2021-11-24 ENCOUNTER — Ambulatory Visit: Payer: Medicare Other | Admitting: Family Medicine
